# Patient Record
Sex: Female | Born: 1970
Health system: Southern US, Community
[De-identification: ages and names within clinical notes are randomized; demographics above are authoritative.]

## PROBLEM LIST (undated history)

## (undated) DIAGNOSIS — R3915 Urgency of urination: Secondary | ICD-10-CM

## (undated) DIAGNOSIS — K219 Gastro-esophageal reflux disease without esophagitis: Secondary | ICD-10-CM

## (undated) DIAGNOSIS — D649 Anemia, unspecified: Secondary | ICD-10-CM

## (undated) DIAGNOSIS — I10 Essential (primary) hypertension: Secondary | ICD-10-CM

## (undated) DIAGNOSIS — R519 Headache, unspecified: Secondary | ICD-10-CM

## (undated) DIAGNOSIS — Z87442 Personal history of urinary calculi: Secondary | ICD-10-CM

## (undated) DIAGNOSIS — J45909 Unspecified asthma, uncomplicated: Secondary | ICD-10-CM

## (undated) DIAGNOSIS — Z8489 Family history of other specified conditions: Secondary | ICD-10-CM

## (undated) DIAGNOSIS — N201 Calculus of ureter: Secondary | ICD-10-CM

## (undated) DIAGNOSIS — R51 Headache: Secondary | ICD-10-CM

## (undated) HISTORY — DX: Unspecified asthma, uncomplicated: J45.909

---

## 1994-09-25 HISTORY — PX: NEPHROLITHOTOMY: SUR881

## 2000-04-06 ENCOUNTER — Other Ambulatory Visit: Admission: RE | Admit: 2000-04-06 | Discharge: 2000-04-06 | Payer: Self-pay | Admitting: Obstetrics and Gynecology

## 2001-02-07 ENCOUNTER — Ambulatory Visit (HOSPITAL_COMMUNITY): Admission: RE | Admit: 2001-02-07 | Discharge: 2001-02-07 | Payer: Self-pay | Admitting: Obstetrics and Gynecology

## 2001-02-07 ENCOUNTER — Encounter: Payer: Self-pay | Admitting: Obstetrics and Gynecology

## 2001-04-17 ENCOUNTER — Encounter (HOSPITAL_COMMUNITY): Admission: AD | Admit: 2001-04-17 | Discharge: 2001-04-24 | Payer: Self-pay | Admitting: Obstetrics and Gynecology

## 2001-04-23 ENCOUNTER — Encounter: Payer: Self-pay | Admitting: Obstetrics and Gynecology

## 2001-04-29 ENCOUNTER — Encounter (HOSPITAL_COMMUNITY): Admission: AD | Admit: 2001-04-29 | Discharge: 2001-05-29 | Payer: Self-pay | Admitting: Obstetrics and Gynecology

## 2001-05-30 ENCOUNTER — Inpatient Hospital Stay (HOSPITAL_COMMUNITY): Admission: AD | Admit: 2001-05-30 | Discharge: 2001-05-30 | Payer: Self-pay | Admitting: Obstetrics and Gynecology

## 2001-06-06 ENCOUNTER — Encounter (HOSPITAL_COMMUNITY): Admission: RE | Admit: 2001-06-06 | Discharge: 2001-06-28 | Payer: Self-pay | Admitting: Obstetrics and Gynecology

## 2001-06-08 ENCOUNTER — Inpatient Hospital Stay (HOSPITAL_COMMUNITY): Admission: AD | Admit: 2001-06-08 | Discharge: 2001-06-08 | Payer: Self-pay | Admitting: Obstetrics and Gynecology

## 2001-07-02 ENCOUNTER — Encounter (INDEPENDENT_AMBULATORY_CARE_PROVIDER_SITE_OTHER): Payer: Self-pay | Admitting: Specialist

## 2001-07-02 ENCOUNTER — Inpatient Hospital Stay (HOSPITAL_COMMUNITY): Admission: AD | Admit: 2001-07-02 | Discharge: 2001-07-06 | Payer: Self-pay | Admitting: Obstetrics and Gynecology

## 2001-08-13 ENCOUNTER — Other Ambulatory Visit: Admission: RE | Admit: 2001-08-13 | Discharge: 2001-08-13 | Payer: Self-pay | Admitting: Obstetrics and Gynecology

## 2002-09-25 HISTORY — PX: ABDOMINOPLASTY/PANNICULECTOMY: SHX5578

## 2003-01-19 ENCOUNTER — Other Ambulatory Visit: Admission: RE | Admit: 2003-01-19 | Discharge: 2003-01-19 | Payer: Self-pay | Admitting: Obstetrics and Gynecology

## 2003-09-26 HISTORY — PX: BREAST REDUCTION SURGERY: SHX8

## 2004-01-20 ENCOUNTER — Other Ambulatory Visit: Admission: RE | Admit: 2004-01-20 | Discharge: 2004-01-20 | Payer: Self-pay | Admitting: Obstetrics and Gynecology

## 2004-11-21 ENCOUNTER — Ambulatory Visit: Payer: Self-pay | Admitting: Internal Medicine

## 2005-04-28 ENCOUNTER — Other Ambulatory Visit: Admission: RE | Admit: 2005-04-28 | Discharge: 2005-04-28 | Payer: Self-pay | Admitting: Obstetrics and Gynecology

## 2005-12-05 ENCOUNTER — Ambulatory Visit: Payer: Self-pay | Admitting: Internal Medicine

## 2006-03-17 ENCOUNTER — Emergency Department (HOSPITAL_COMMUNITY): Admission: EM | Admit: 2006-03-17 | Discharge: 2006-03-17 | Payer: Self-pay | Admitting: Emergency Medicine

## 2006-11-18 ENCOUNTER — Emergency Department (HOSPITAL_COMMUNITY): Admission: EM | Admit: 2006-11-18 | Discharge: 2006-11-18 | Payer: Self-pay | Admitting: Emergency Medicine

## 2007-10-06 ENCOUNTER — Emergency Department (HOSPITAL_COMMUNITY): Admission: EM | Admit: 2007-10-06 | Discharge: 2007-10-06 | Payer: Self-pay | Admitting: Family Medicine

## 2011-02-10 NOTE — H&P (Signed)
[facility]  Patient:    IPEK, WESTRA Visit Number: [id number] MRN: [id number]          Service Type: OBS Location: 910A [id number] [id number] Attending Physician:  Oliver Pila Dictated by:   Alvino Chapel, M.D. Admit Date:  07/02/2001                           History and Physical  HISTORY OF PRESENT ILLNESS:   The patient is a 40 year old G4, P0-1-2-1 who is scheduled for a repeat C-section at 39 weeks due to an unfavorable cervix and pregnancy-induced hypertension which necessitated delivery.  The patient decided to decline VBAC and proceed with a repeat C-section.  The patients due date is July 09, 2001 by first trimester ultrasound.  On admission, the patient had no preeclamptic symptoms.  However, she had continued to have persistently elevated blood pressures since approximately 35 weeks, running in the 150s/90-100.  She had no proteinuria.  Her PIH labs were normal prior to admission.  Therefore, the diagnosis was pregnancy-induced hypertension without evidence of preeclampsia.  Her pregnancy has been complicated by a history of a previous C-section for a preterm baby with pregnancy-induced hypertension with that pregnancy, as well.  Also, the patient was a group B strep carrier.  She has a history of HSV and a history of migraines.  PRENATAL LABORATORY DATA:     A positive.  Antibody negative.  RPR nonreactive.  Rubella immune.  Hepatitis B surface antigen.  HIV negative.  GC negative.  Chlamydia negative.  GBS positive.  PAST OBSTETRIC HISTORY:       1. In 1991 and 1992, the patient had elective                                  abortions.                               2. In 1997, she had a C-section of a preterm                                  infant at 35+ weeks.  The infant was in the                                  transverse lie and weighed 3 lb 15 oz.  PAST GYNECOLOGIC HISTORY:     History of HSV with no outbreaks and a  long time.  PAST MEDICAL HISTORY:         The patient does have a history of pregnancy-induced hypertension with her last delivery.  However, blood pressures were well controlled prior to pregnancy on Norvasc and Zyrtec.  The patient had discontinued these medications prior to pregnancy and, as blood pressure remained stable early on, was not restarted.  PAST SURGICAL HISTORY:        1. In 1996, she had a kidney stone removed.                               2. In 1997, she had her C-section.  PHYSICAL EXAMINATION:  VITAL SIGNS:  On admission, the patient is 233 lb.  Blood pressure 150/98.  CARDIAC:                      Regular rate and rhythm.  LUNGS:                        Clear to auscultation bilaterally.  ABDOMEN:                      Gravid and nontender with a fundal height of 37.  PELVIC:                       The cervix is long and closed.  PLAN:                         The patient was counseled as to the risks and benefits of repeat C-section including bleeding, infection and possible damage to bowel and bladder.  She was also given the option of induction and VBAC. However, she declined these, given an unfavorable cervix and the possibility of a long induction.  The patient also stated a desire for tubal ligation. She was quoted a risk of failure of approximately 1/200 and also the risk of increased ectopic should she become pregnant status post tubal.  She agrees to proceed with this permanent sterilization procedure at the time of her C-section. Dictated by:   Alvino Chapel, M.D. Attending Physician:  Oliver Pila DD:  07/02/01 TD:  07/02/01 Job: [id number]

## 2011-02-10 NOTE — Op Note (Signed)
[facility]  Patient:    Ashley Bradford, Ashley Bradford Visit Number: [id number] MRN: [id number]          Service Type: ANT Location: [facility] Attending Physician:  Michaele Offer Dictated by:   Alvino Chapel, M.D. Proc. Date: 07/02/01 Admit Date:  06/06/2001 Discharge Date: 06/28/2001                             Operative Report  PREOPERATIVE DIAGNOSES:       1. Term pregnancy at 39 weeks.                               2. Previous cesarean section, desires repeat.                               3. Pregnancy-induced hypertension.                               4. Breech presentation.  POSTOPERATIVE DIAGNOSES:      1. Term pregnancy at 39 weeks.                               2. Previous cesarean section, desires repeat.                               3. Pregnancy-induced hypertension.                               4. Breech presentation.  PROCEDURE:                    Repeat low transverse cesarean section with bilateral partial salpingectomy.  SURGEON:                      Alvino Chapel, M.D.  ASSISTANT:                    Zenaida Niece, M.D.  ANESTHESIA:                   Epidural spinal combination.  FINDINGS:                     Vigorous female infant in the breech presentation.  Apgars were 8 and 9.  Weight was 6 lb 3 oz.  The placenta, tubes and ovaries all appeared within normal limits.  ESTIMATED BLOOD LOSS:         800 cc.  URINE OUTPUT:                 150 cc clear urine.  IV FLUIDS:                    2000 cc LR.  DESCRIPTION OF PROCEDURE:     The patient was taken to the operating room, where a combination spinal and epidural was obtained without difficulty.  She was then prepped in the normal sterile fashion in the dorsal supine position with a leftward tilt.  A Pfannenstiel skin incision was then made through a preexisting scar and carried through to the  underlying layer of fascia by dissection with Bovie cautery.   The fascia was then nicked in the midline and the incision was extended laterally with Mayo scissors.  The inferior aspect was grasped with Kocher clamps, elevated and dissected off the underlying rectus muscle.  The superior aspect was dissected off the underlying rectus muscles after being elevated with Kocher clamps in a similar fashion.  The rectus muscles were then separated in the midline with hemostats and the peritoneal cavity entered bluntly.  The peritoneum was then extended both superiorly and inferiorly with careful attention to avoid both bowel and bladder.  A bladder blade was then inserted and the vesicouterine peritoneum identified.  There was some scarring of the bladder to the anterior surface of the uterus.  This was easily taken down with Metzenbaums and a bladder flap created additionally with Metzenbaum scissors.  The bladder was then pushed away from the lower uterine segment.  The bladder blade was reinserted and the lower uterine segment was incised in a transverse fashion.  The cavity was entered bluntly and the incision was extended laterally with bandage scissors. The infants sacrum was then identified and the bottom delivered easily with gentle fundal pressure.  Each leg was carefully reduced and the infant rotated with the arms reduced easily.  The head was delivered in a flexed position and the nose and mouth bulb suctioned.  The cord was clamped and cut. The infant was then handed off to the waiting pediatricians.  The placenta was then expressed manually and the uterus cleared of all clots and debris with a moist lap sponge.  The uterine incision was then closed with 0 Vicryl in a running locked fashion.  One additional area of bleeding in the right lower uterine segment was controlled with one figure-of-eight suture of the same.  Attention was then turned to the patients ovaries and tubes.  On her left, the fallopian tube was identified, grasped with a  Babcock clamp, elevated and a small window made in the mesosalpinx with Bovie cautery.  A 0 plain was then passed through the window and a 2-3 cm segment of tube tied off.  An additional plain tied was passed around this 2 cm knuckle and it was then amputated with Metzenbaum scissors.  The pedicle was then cauterized at the tubal lumen with Bovie cautery and then returned to the abdomen.  In a similar fashion, the patients right fallopian tube was grasped with a Babcock clamp, elevated and a window made in the mesosalpinx.  It was then also secured with two free ties of 0 plain, amputated and handed off to pathology. Additionally, the pedicle lumen were cauterized with Bovie cautery.  All was hemostatic and it was returned to the abdomen.  The uterine incision was then once again inspected and found to be hemostatic.  The bladder flap also was hemostatic.  The patients rectus muscles were then reapproximated with one figure-of-eight suture.  The fascia was then closed with 0 Vicryl in a running fashion.  The skin was closed with staples.  Sponge, lap, and needle counts were correct x 2.  The patient was taken to the recovery room in stable condition. Dictated by:   Alvino Chapel, M.D. Attending Physician:  Michaele Offer DD:  07/02/01 TD:  07/03/01 Job: [id number]

## 2011-02-10 NOTE — Discharge Summary (Signed)
[facility]  Patient:    Ashley Bradford, Ashley Bradford Visit Number: [id number] MRN: [id number]          Service Type: ANT Location: [facility] Attending Physician:  Michaele Offer Dictated by:   Malachi Pro. Ambrose Mantle, M.D. Admit Date:  06/06/2001 Discharge Date: 06/28/2001                             Discharge Summary  HISTORY:                      This is a 40 year old white female, para 0-1-2-1, gravida 4, blood group and type A positive, negative antibody, nonreactive serology, rubella immune, hepatitis B surface antigen negative, HIV negative, GC and Chlamydia negative, triple screen declined, admitted for repeat C-section after previous C-section, desired a repeat, pregnancy induced high blood pressure and breech presentation.  HOSPITAL COURSE:              The patient underwent a low transverse cervical cesarean section with bilateral tubal ligation by Dr. Senaida Ores, with Dr. Jackelyn Knife assisting, under epidural and spinal anesthesia. Vigorous female infant, 6 pounds 3 ounces, with Apgars of 8 and 9 at one and five minutes, breech presentation. Placenta, tubes, and ovaries were normal. Blood loss was about 800 cc.  Postpartum, the patient did quite well. She wanted to stay until the fourth postoperative day and on the fourth postoperative day, was ambulating well without difficulty, voiding well without difficulty, having bowel movements. Staples were removed and Steri-Strips were applied and she was ready for discharge.  Pathology report showed fallopian tube segments bilateral, complete transections with no pathologic abnormalities.  LABORATORY DATA:              Hemoglobin was 11.6, hematocrit 33.5, white count 13,500, platelet count 269,000. Followup hemoglobin 9.0, hematocrit 26.0, white count 11,100, platelet count 186,000. RPR nonreactive.  FINAL DIAGNOSES:              1. Intrauterine pregnancy at 39+ weeks.                               2.  Pregnancy induced high blood pressure.                               3. Breech presentation.                               4. Previous cesarean section, declined vaginal                                  birth after cesarean.  OPERATION:                    1. Repeat low transverse cervical cesarean                                  section.                               2. Bilateral tubal ligation.  FINAL CONDITION:  Improved.  DISCHARGE INSTRUCTIONS:       Instructions include our regular discharge instruction booklets.  DISCHARGE MEDICATIONS:        The patient is given a prescription for Tylenol #3, 16 tablets, one every four to six hours as needed for pain.  DISCHARGE FOLLOWUP:           The patient is advised to return to the office in 10 to 14 days for followup examination.                                  hours as needed for pain.Dictated by:   Malachi Pro. Ambrose Mantle, M.D. Attending Physician:  Michaele Offer DD:  07/06/01 TD:  07/06/01 Job: [id number]

## 2011-05-26 ENCOUNTER — Emergency Department (HOSPITAL_COMMUNITY)
Admission: EM | Admit: 2011-05-26 | Discharge: 2011-05-26 | Disposition: A | Discharge status: Home / Self Care | Payer: No Typology Code available for payment source | Attending: Emergency Medicine | Admitting: Emergency Medicine

## 2011-05-26 ENCOUNTER — Encounter: Payer: Self-pay | Admitting: Emergency Medicine

## 2011-05-26 ENCOUNTER — Emergency Department (HOSPITAL_COMMUNITY): Payer: No Typology Code available for payment source

## 2011-05-26 DIAGNOSIS — S161XXA Strain of muscle, fascia and tendon at neck level, initial encounter: Secondary | ICD-10-CM

## 2011-05-26 DIAGNOSIS — S139XXA Sprain of joints and ligaments of unspecified parts of neck, initial encounter: Secondary | ICD-10-CM | POA: Insufficient documentation

## 2011-05-26 DIAGNOSIS — Y92410 Unspecified street and highway as the place of occurrence of the external cause: Secondary | ICD-10-CM | POA: Insufficient documentation

## 2011-05-26 DIAGNOSIS — I10 Essential (primary) hypertension: Secondary | ICD-10-CM | POA: Insufficient documentation

## 2011-05-26 HISTORY — DX: Essential (primary) hypertension: I10

## 2011-05-26 MED ORDER — IBUPROFEN 800 MG PO TABS: 800.0000 mg | ORAL_TABLET | Freq: Three times a day (TID) | ORAL | Status: AC

## 2011-05-26 MED ORDER — HYDROCODONE-ACETAMINOPHEN 5-325 MG PO TABS
2.0000 | ORAL_TABLET | Freq: Once | ORAL | Status: AC
  Administered 2011-05-26: 2 via ORAL
  Filled 2011-05-26: qty 2

## 2011-05-26 MED ORDER — HYDROCODONE-ACETAMINOPHEN 5-325 MG PO TABS: 2.0000 | ORAL_TABLET | ORAL | Status: AC | PRN

## 2011-05-26 NOTE — ED Notes (Signed)
Pt resting in bed, shp officer at bedside.--c-collar remains in place
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
## 2011-05-30 ENCOUNTER — Encounter (HOSPITAL_COMMUNITY): Payer: Self-pay

## 2012-12-27 ENCOUNTER — Other Ambulatory Visit: Payer: Self-pay | Admitting: *Deleted

## 2012-12-27 MED ORDER — ZOLPIDEM TARTRATE 10 MG PO TABS: 10.0000 mg | ORAL_TABLET | Freq: Every evening | ORAL | Status: DC | PRN

## 2012-12-27 NOTE — Telephone Encounter (Signed)
Please route to your nurse to call in. Thanks. Last seen 4/13

## 2012-12-27 NOTE — Telephone Encounter (Signed)
CALLED INTO [employer]

## 2012-12-27 NOTE — Telephone Encounter (Signed)
Please call in

## 2013-01-08 ENCOUNTER — Telehealth: Payer: Self-pay | Admitting: Nurse Practitioner

## 2013-01-08 NOTE — Telephone Encounter (Signed)
Please make appointment for patient

## 2013-01-08 NOTE — Telephone Encounter (Signed)
APPT MADE

## 2013-01-28 ENCOUNTER — Other Ambulatory Visit: Payer: Self-pay | Admitting: Nurse Practitioner

## 2013-02-26 ENCOUNTER — Ambulatory Visit (INDEPENDENT_AMBULATORY_CARE_PROVIDER_SITE_OTHER): Payer: Managed Care, Other (non HMO) | Admitting: Nurse Practitioner

## 2013-02-26 ENCOUNTER — Encounter: Payer: Self-pay | Admitting: Nurse Practitioner

## 2013-02-26 VITALS — BP 126/78 | HR 70 | Temp 98.2°F | Ht 64.0 in | Wt 239.0 lb

## 2013-02-26 DIAGNOSIS — M549 Dorsalgia, unspecified: Secondary | ICD-10-CM

## 2013-02-26 DIAGNOSIS — Z91010 Allergy to peanuts: Secondary | ICD-10-CM

## 2013-02-26 DIAGNOSIS — G43909 Migraine, unspecified, not intractable, without status migrainosus: Secondary | ICD-10-CM

## 2013-02-26 DIAGNOSIS — G4700 Insomnia, unspecified: Secondary | ICD-10-CM

## 2013-02-26 DIAGNOSIS — E669 Obesity, unspecified: Secondary | ICD-10-CM

## 2013-02-26 DIAGNOSIS — K219 Gastro-esophageal reflux disease without esophagitis: Secondary | ICD-10-CM | POA: Insufficient documentation

## 2013-02-26 DIAGNOSIS — I10 Essential (primary) hypertension: Secondary | ICD-10-CM | POA: Insufficient documentation

## 2013-02-26 DIAGNOSIS — Z0000 Encounter for general adult medical examination without abnormal findings: Secondary | ICD-10-CM

## 2013-02-26 LAB — COMPLETE METABOLIC PANEL WITH GFR
ALT: 15 U/L (ref 0–35)
AST: 12 U/L (ref 0–37)
Albumin: 4.2 g/dL (ref 3.5–5.2)
Alkaline Phosphatase: 76 U/L (ref 39–117)
Calcium: 9.1 mg/dL (ref 8.4–10.5)
Creat: 0.95 mg/dL (ref 0.50–1.10)
GFR, Est African American: 86 mL/min
GFR, Est Non African American: 75 mL/min
Glucose, Bld: 88 mg/dL (ref 70–99)
Total Bilirubin: 0.3 mg/dL (ref 0.3–1.2)
Total Protein: 6.5 g/dL (ref 6.0–8.3)

## 2013-02-26 MED ORDER — PANTOPRAZOLE SODIUM 40 MG PO TBEC: 40.0000 mg | DELAYED_RELEASE_TABLET | Freq: Every day | ORAL | Status: DC

## 2013-02-26 MED ORDER — ZOLPIDEM TARTRATE 10 MG PO TABS: 10.0000 mg | ORAL_TABLET | Freq: Every evening | ORAL | Status: DC | PRN

## 2013-02-26 MED ORDER — NEBIVOLOL HCL 10 MG PO TABS: 10.0000 mg | ORAL_TABLET | Freq: Every day | ORAL | Status: DC

## 2013-02-26 MED ORDER — NAPROXEN 500 MG PO TABS: 500.0000 mg | ORAL_TABLET | Freq: Two times a day (BID) | ORAL | Status: DC

## 2013-02-26 MED ORDER — EPINEPHRINE 0.3 MG/0.3ML IJ SOAJ: 0.3000 mg | Freq: Once | INTRAMUSCULAR | Status: DC

## 2013-02-26 MED ORDER — IBUPROFEN 800 MG PO TABS: 800.0000 mg | ORAL_TABLET | Freq: Three times a day (TID) | ORAL | Status: DC | PRN

## 2013-02-26 NOTE — Progress Notes (Signed)
Subjective:    Patient ID: Ashley Bradford, female    DOB: 03-Jul-1971, 42 y.o.   MRN: [id number]  Patient here today for cpe for work- She is doing well- Has no compliants today  Hypertension This is a chronic problem. The current episode started more than 1 year ago. The problem has been resolved since onset. The problem is controlled. Pertinent negatives include no blurred vision, chest pain, headaches, palpitations, peripheral edema or shortness of breath. Risk factors for coronary artery disease include obesity. Past treatments include beta blockers. The current treatment provides significant improvement. Compliance problems include diet and exercise.   GERD Taking pepcid which doesn't help- like protonix but is to expensive- would like to try protonix again     Review of Systems  Eyes: Negative for blurred vision.  Respiratory: Negative for shortness of breath.   Cardiovascular: Negative for chest pain and palpitations.  Neurological: Negative for headaches.  All other systems reviewed and are negative.       Objective:   Physical Exam  Constitutional: She is oriented to person, place, and time. She appears well-developed and well-nourished.  HENT:  Nose: Nose normal.  Mouth/Throat: Oropharynx is clear and moist.  Eyes: EOM are normal.  Neck: Trachea normal, normal range of motion and full passive range of motion without pain. Neck supple. No JVD present. Carotid bruit is not present. No thyromegaly present.  Cardiovascular: Normal rate, regular rhythm, normal heart sounds and intact distal pulses.  Exam reveals no gallop and no friction rub.   No murmur heard. Pulmonary/Chest: Effort normal and breath sounds normal.  Abdominal: Soft. Bowel sounds are normal. She exhibits no distension and no mass. There is no tenderness.  Musculoskeletal: Normal range of motion.  Lymphadenopathy:    She has no cervical adenopathy.  Neurological: She is alert and oriented to person, place,  and time. She has normal reflexes.  Skin: Skin is warm and dry.  Psychiatric: She has a normal mood and affect. Her behavior is normal. Judgment and thought content normal.    BP 126/78  Pulse 70  Temp(Src) 98.2 F (36.8 C) (Oral)  Ht 5\' 4"  (1.626 m)  Wt 239 lb (108.41 kg)  BMI 41 kg/m2       Assessment & Plan:   1. Annual physical exam   2. Hypertension   3. GERD (gastroesophageal reflux disease)   4. Obesity (BMI 35.0-39.9 without comorbidity)   5. Migraines   6. Back pain   7. Insomnia   8. Peanut allergy    Orders Placed This Encounter  Procedures  . COMPLETE METABOLIC PANEL WITH GFR  . NMR Lipoprofile with Lipids   Meds ordered this encounter  Medications  . clobetasol cream (TEMOVATE) 0.05 %    Sig:   . terbinafine (LAMISIL) 250 MG tablet    Sig:   . valACYclovir (VALTREX) 500 MG tablet    Sig:   . nebivolol (BYSTOLIC) 10 MG tablet    Sig: Take 1 tablet (10 mg total) by mouth daily.    Dispense:  90 tablet    Refill:  1    Order Specific Question:  Supervising Provider    Answer:  Ernestina Penna [[id number]]  . naproxen (NAPROSYN) 500 MG tablet    Sig: Take 1 tablet (500 mg total) by mouth 2 (two) times daily with a meal.    Dispense:  180 tablet    Refill:  0    Order Specific Question:  Supervising Provider  Answer:  Ernestina Penna [[id number]]  . ibuprofen (ADVIL,MOTRIN) 800 MG tablet    Sig: Take 1 tablet (800 mg total) by mouth every 8 (eight) hours as needed for pain.    Dispense:  180 tablet    Refill:  0    Order Specific Question:  Supervising Provider    Answer:  Ernestina Penna [[id number]]  . pantoprazole (PROTONIX) 40 MG tablet    Sig: Take 1 tablet (40 mg total) by mouth daily.    Dispense:  90 tablet    Refill:  1    Order Specific Question:  Supervising Provider    Answer:  Ernestina Penna [[id number]]  . zolpidem (AMBIEN) 10 MG tablet    Sig: Take 1 tablet (10 mg total) by mouth at bedtime as needed for sleep.    Dispense:  30 tablet    Refill:  1     Order Specific Question:  Supervising Provider    Answer:  Ernestina Penna [[id number]]  . EPINEPHrine (EPIPEN) 0.3 mg/0.3 mL DEVI    Sig: Inject 0.3 mLs (0.3 mg total) into the muscle once.    Dispense:  2 Device    Refill:  1    Order Specific Question:  Supervising Provider    Answer:  Ernestina Penna [[id number]]   Diet and exercise encouraged Labs pending Follow up in 6 months  Mary-Margaret Daphine Deutscher, FNP

## 2013-02-26 NOTE — Patient Instructions (Signed)

## 2013-02-27 LAB — NMR LIPOPROFILE WITH LIPIDS
HDL Size: 9.2 nm (ref >=9.2)
HDL-C: 50 mg/dL (ref >=40)
LDL Size: 20.8 nm (ref >20.5)
Large HDL-P: 5.1 umol/L (ref >=4.8)
Triglycerides: 95 mg/dL (ref <150)

## 2013-03-12 ENCOUNTER — Telehealth: Payer: Self-pay | Admitting: Nurse Practitioner

## 2013-03-13 ENCOUNTER — Encounter: Payer: Self-pay | Admitting: *Deleted

## 2013-03-13 NOTE — Telephone Encounter (Signed)
Pt aware.

## 2013-08-05 ENCOUNTER — Telehealth: Payer: Self-pay | Admitting: Nurse Practitioner

## 2013-08-05 NOTE — Telephone Encounter (Signed)
Has patient not been taking meds sinc June that is when they were written

## 2013-08-05 NOTE — Telephone Encounter (Signed)
90 day supply was given on all meds and should last hr till December- does she wants [place] to refill now- NTBS

## 2013-08-05 NOTE — Telephone Encounter (Signed)
She never go them when she was here

## 2013-08-11 ENCOUNTER — Other Ambulatory Visit: Payer: Self-pay | Admitting: *Deleted

## 2013-08-11 DIAGNOSIS — I10 Essential (primary) hypertension: Secondary | ICD-10-CM

## 2013-08-11 MED ORDER — NEBIVOLOL HCL 10 MG PO TABS: 10.0000 mg | ORAL_TABLET | Freq: Every day | ORAL | Status: DC

## 2013-08-11 NOTE — Telephone Encounter (Signed)
LAST OV 02/26/13. MAIL ORDER, PLEASE PRINT FOR PT TO PICKUP. THANKS.

## 2013-08-19 ENCOUNTER — Encounter: Payer: Self-pay | Admitting: Family Medicine

## 2013-08-19 ENCOUNTER — Ambulatory Visit (INDEPENDENT_AMBULATORY_CARE_PROVIDER_SITE_OTHER): Payer: Managed Care, Other (non HMO) | Admitting: Family Medicine

## 2013-08-19 VITALS — BP 130/76 | HR 69 | Temp 97.0°F | Ht 64.0 in | Wt 247.0 lb

## 2013-08-19 DIAGNOSIS — H60399 Other infective otitis externa, unspecified ear: Secondary | ICD-10-CM

## 2013-08-19 DIAGNOSIS — B370 Candidal stomatitis: Secondary | ICD-10-CM

## 2013-08-19 DIAGNOSIS — I10 Essential (primary) hypertension: Secondary | ICD-10-CM

## 2013-08-19 DIAGNOSIS — H6093 Unspecified otitis externa, bilateral: Secondary | ICD-10-CM

## 2013-08-19 DIAGNOSIS — J40 Bronchitis, not specified as acute or chronic: Secondary | ICD-10-CM

## 2013-08-19 MED ORDER — METHYLPREDNISOLONE ACETATE 80 MG/ML IJ SUSP
80.0000 mg | Freq: Once | INTRAMUSCULAR | Status: AC
  Administered 2013-08-19: 80 mg via INTRAMUSCULAR

## 2013-08-19 MED ORDER — AZITHROMYCIN 250 MG PO TABS: ORAL_TABLET | ORAL | Status: DC

## 2013-08-19 MED ORDER — NEBIVOLOL HCL 10 MG PO TABS: 10.0000 mg | ORAL_TABLET | Freq: Every day | ORAL | Status: DC

## 2013-08-19 MED ORDER — NYSTATIN 100000 UNIT/ML MT SUSP: 5.0000 mL | Freq: Four times a day (QID) | OROMUCOSAL | Status: DC

## 2013-08-19 MED ORDER — NEOMYCIN-POLYMYXIN-HC 3.5-10000-1 OT SOLN: 3.0000 [drp] | Freq: Four times a day (QID) | OTIC | Status: DC

## 2013-08-19 NOTE — Progress Notes (Signed)
  Subjective:    Patient ID: Ashley Bradford, female    DOB: 18-Nov-1970, 42 y.o.   MRN: [id number]  HPI URI Symptoms Onset: 1 week  Description: rhinorrhea, nasal congestion, cough, faint wheezing  Modifying factors:  Baseline asthma   Symptoms Nasal discharge: yes Fever: no Sore throat: no Cough: yes Wheezing: yes Ear pain: no GI symptoms: no Sick contacts: multiple  Red Flags  Stiff neck: no Dyspnea: no Rash: no Swallowing difficulty: no  Sinusitis Risk Factors Headache/face pain: no Double sickening: no tooth pain: n  Allergy Risk Factors Sneezing: no Itchy scratchy throat: no Seasonal symptoms: no  Flu Risk Factors Headache: no muscle aches: no severe fatigue: no     Review of Systems  All other systems reviewed and are negative.       Objective:   Physical Exam  Constitutional: She is oriented to person, place, and time. She appears well-developed and well-nourished.  HENT:  Head: Normocephalic and atraumatic.  Right Ear: External ear normal.  Left Ear: External ear normal.  +nasal erythema, rhinorrhea bilaterally, + post oropharyngeal erythema  Bilateral ear canal erythema and tenderness to otoscopic evaluation.  + oral thrush     Eyes: Conjunctivae are normal. Pupils are equal, round, and reactive to light.  Neck: Normal range of motion. Neck supple.  Cardiovascular: Normal rate and regular rhythm.   Pulmonary/Chest: Effort normal and breath sounds normal.  Abdominal: Soft. Bowel sounds are normal.  Musculoskeletal: Normal range of motion.  Neurological: She is alert and oriented to person, place, and time.  Skin: Skin is warm.          Assessment & Plan:  Hypertension - Plan: nebivolol (BYSTOLIC) 10 MG tablet, DISCONTINUED: nebivolol (BYSTOLIC) 10 MG tablet  Bronchitis - Plan: azithromycin (ZITHROMAX) 250 MG tablet, methylPREDNISolone acetate (DEPO-MEDROL) injection 80 mg  Oral thrush - Plan: nystatin (MYCOSTATIN) 100000 UNIT/ML  suspension  OE (otitis externa), bilateral - Plan: neomycin-polymyxin-hydrocortisone (CORTISPORIN) otic solution  Plan as above.  zpak for atypical coverage if sxs fail to improve.  Nystatin swish and swallow for thrush Cortisporin for OE Discussed general, resp, ENT and infectious red flags  Follow up as needed.

## 2013-08-19 NOTE — Patient Instructions (Signed)

## 2013-11-25 ENCOUNTER — Encounter (HOSPITAL_BASED_OUTPATIENT_CLINIC_OR_DEPARTMENT_OTHER): Payer: Self-pay | Admitting: Emergency Medicine

## 2013-11-25 ENCOUNTER — Emergency Department (HOSPITAL_BASED_OUTPATIENT_CLINIC_OR_DEPARTMENT_OTHER)
Admission: EM | Admit: 2013-11-25 | Discharge: 2013-11-25 | Disposition: A | Discharge status: Home / Self Care | Payer: Managed Care, Other (non HMO) | Attending: Emergency Medicine | Admitting: Emergency Medicine

## 2013-11-25 ENCOUNTER — Emergency Department (HOSPITAL_BASED_OUTPATIENT_CLINIC_OR_DEPARTMENT_OTHER): Payer: Managed Care, Other (non HMO)

## 2013-11-25 DIAGNOSIS — Z79899 Other long term (current) drug therapy: Secondary | ICD-10-CM | POA: Insufficient documentation

## 2013-11-25 DIAGNOSIS — Z791 Long term (current) use of non-steroidal anti-inflammatories (NSAID): Secondary | ICD-10-CM | POA: Insufficient documentation

## 2013-11-25 DIAGNOSIS — Z3202 Encounter for pregnancy test, result negative: Secondary | ICD-10-CM | POA: Insufficient documentation

## 2013-11-25 DIAGNOSIS — N200 Calculus of kidney: Secondary | ICD-10-CM

## 2013-11-25 DIAGNOSIS — Z9889 Other specified postprocedural states: Secondary | ICD-10-CM | POA: Insufficient documentation

## 2013-11-25 DIAGNOSIS — J45909 Unspecified asthma, uncomplicated: Secondary | ICD-10-CM | POA: Insufficient documentation

## 2013-11-25 DIAGNOSIS — I10 Essential (primary) hypertension: Secondary | ICD-10-CM | POA: Insufficient documentation

## 2013-11-25 DIAGNOSIS — N390 Urinary tract infection, site not specified: Secondary | ICD-10-CM

## 2013-11-25 LAB — URINALYSIS, ROUTINE W REFLEX MICROSCOPIC
BILIRUBIN URINE: NEGATIVE
GLUCOSE, UA: NEGATIVE mg/dL
Ketones, ur: NEGATIVE mg/dL
Nitrite: NEGATIVE
PH: 5.5 (ref 5.0–8.0)
Protein, ur: NEGATIVE mg/dL
SPECIFIC GRAVITY, URINE: 1.019 (ref 1.005–1.030)
UROBILINOGEN UA: 0.2 mg/dL (ref 0.0–1.0)

## 2013-11-25 LAB — PREGNANCY, URINE: Preg Test, Ur: NEGATIVE

## 2013-11-25 LAB — URINE MICROSCOPIC-ADD ON

## 2013-11-25 MED ORDER — CEPHALEXIN 500 MG PO CAPS
500.0000 mg | ORAL_CAPSULE | Freq: Four times a day (QID) | ORAL | Status: DC
Start: 2013-11-25 — End: 2014-05-12

## 2013-11-25 MED ORDER — HYDROCODONE-ACETAMINOPHEN 5-325 MG PO TABS: 2.0000 | ORAL_TABLET | ORAL | Status: DC | PRN

## 2013-11-25 MED ORDER — DIPHENHYDRAMINE HCL 50 MG/ML IJ SOLN: 25.0000 mg | Freq: Once | INTRAMUSCULAR | Status: DC

## 2013-11-25 MED ORDER — DIPHENHYDRAMINE HCL 50 MG/ML IJ SOLN
25.0000 mg | Freq: Once | INTRAMUSCULAR | Status: AC
  Administered 2013-11-25: 25 mg via INTRAMUSCULAR
  Filled 2013-11-25: qty 1

## 2013-11-25 MED ORDER — HYDROCODONE-ACETAMINOPHEN 7.5-325 MG/15ML PO SOLN
20.0000 mL | Freq: Once | ORAL | Status: AC
  Administered 2013-11-25: 20 mL via ORAL
  Filled 2013-11-25: qty 30

## 2013-11-25 MED ORDER — KETOROLAC TROMETHAMINE 60 MG/2ML IM SOLN
60.0000 mg | Freq: Once | INTRAMUSCULAR | Status: AC
  Administered 2013-11-25: 60 mg via INTRAMUSCULAR
  Filled 2013-11-25: qty 2

## 2013-11-25 NOTE — ED Notes (Signed)
Pt complains of left flank pain that started about 3 hrs ago and complains of dysuria.  Complains flank pain radiates around to left groin.

## 2013-11-25 NOTE — ED Notes (Signed)
Pt reports ok with taking Toradol.  Reports now that it just didn't work last time instead of the rash initially.

## 2013-11-25 NOTE — ED Provider Notes (Signed)
CSN: [id number]     Arrival date & time 11/25/13  1836 History   First MD Initiated Contact with Patient 11/25/13 1914     Chief Complaint  Patient presents with  . Flank Pain    left     (Consider location/radiation/quality/duration/timing/severity/associated sxs/prior Treatment) Patient is a 43 y.o. female presenting with flank pain. The history is provided by the patient. No language interpreter was used.  Flank Pain This is a new problem. The current episode started today. The problem occurs constantly. The problem has been unchanged. Associated symptoms include abdominal pain. Nothing aggravates the symptoms. She has tried nothing for the symptoms. The treatment provided no relief.    Past Medical History  Diagnosis Date  . Hypertension   . Asthma   . Kidney calculus    Past Surgical History  Procedure Laterality Date  . Nephrolithotomy    . Cesarean section    . Tubal ligation     Family History  Problem Relation Age of Onset  . Hypertension Mother   . Hypertension Father    History  Substance Use Topics  . Smoking status: Never Smoker   . Smokeless tobacco: Not on file  . Alcohol Use: Yes     Comment: occasionally   OB History   Grav Para Term Preterm Abortions TAB SAB Ect Mult Living                 Review of Systems  Gastrointestinal: Positive for abdominal pain.  Genitourinary: Positive for flank pain.  All other systems reviewed and are negative.      Allergies  Hydromorphone; Morphine and related; Peanut-containing drug products; Toradol; and Sulfa antibiotics  Home Medications   Current Outpatient Rx  Name  Route  Sig  Dispense  Refill  . azithromycin (ZITHROMAX) 250 MG tablet      Take 2 tabs PO x 1 dose, then 1 tab PO QD x 4 days   6 tablet   0   . Calcium Carbonate Antacid (TUMS E-X PO)   Oral   Take 1 tablet by mouth as needed.           . clobetasol cream (TEMOVATE) 0.05 %               . EPINEPHrine (EPIPEN) 0.3 mg/0.3  mL DEVI   Intramuscular   Inject 0.3 mLs (0.3 mg total) into the muscle once.   2 Device   1   . ibuprofen (ADVIL,MOTRIN) 800 MG tablet   Oral   Take 1 tablet (800 mg total) by mouth every 8 (eight) hours as needed for pain.   180 tablet   0   . naproxen (NAPROSYN) 500 MG tablet   Oral   Take 1 tablet (500 mg total) by mouth 2 (two) times daily with a meal.   180 tablet   0   . nebivolol (BYSTOLIC) 10 MG tablet   Oral   Take 1 tablet (10 mg total) by mouth daily.   90 tablet   1   . neomycin-polymyxin-hydrocortisone (CORTISPORIN) otic solution   Both Ears   Place 3 drops into both ears 4 (four) times daily.   10 mL   0   . nystatin (MYCOSTATIN) 100000 UNIT/ML suspension   Oral   Take 5 mLs (500,000 Units total) by mouth 4 (four) times daily.   60 mL   0   . pantoprazole (PROTONIX) 40 MG tablet   Oral   Take 1 tablet (40  mg total) by mouth daily.   90 tablet   1   . valACYclovir (VALTREX) 500 MG tablet               . EXPIRED: zolpidem (AMBIEN) 10 MG tablet   Oral   Take 1 tablet (10 mg total) by mouth at bedtime as needed for sleep.   30 tablet   1    BP 123/64  Pulse 88  Temp(Src) 98.5 F (36.9 C) (Oral)  Resp 20  Ht 5\' 4"  (1.626 m)  Wt 240 lb (108.863 kg)  BMI 41.18 kg/m2  SpO2 98%  LMP 11/23/2013 Physical Exam  Nursing note and vitals reviewed. Constitutional: She is oriented to person, place, and time. She appears well-developed.  HENT:  Head: Normocephalic and atraumatic.  Neck: Normal range of motion.  Cardiovascular: Normal rate.   Pulmonary/Chest: Effort normal.  Musculoskeletal: Normal range of motion.  Neurological: She is alert and oriented to person, place, and time. She has normal reflexes.  Skin: Skin is warm.  Psychiatric: She has a normal mood and affect.    ED Course  Procedures (including critical care time) Labs Review Labs Reviewed  URINALYSIS, ROUTINE W REFLEX MICROSCOPIC - Abnormal; Notable for the following:     APPearance CLOUDY (*)    Hgb urine dipstick LARGE (*)    Leukocytes, UA MODERATE (*)    All other components within normal limits  URINE MICROSCOPIC-ADD ON - Abnormal; Notable for the following:    Squamous Epithelial / LPF FEW (*)    Bacteria, UA MANY (*)    All other components within normal limits  PREGNANCY, URINE   Imaging Review Dg Abd 1 View  11/25/2013   CLINICAL DATA:  Left flank pain, history of nephrolithiasis  EXAM: ABDOMEN - 1 VIEW  COMPARISON:  03/17/2006  FINDINGS: Tiny punctate radiodense foci project over the right kidney lower pole and the left kidney midpole. This could represent nonobstructing intrarenal calculi. Small left hemipelvis calcification noted measures 3 mm. This is indeterminate for a left distal ureteral calculus. No osseous abnormality. Nonobstructive bowel gas pattern.  IMPRESSION: Question tiny punctate intrarenal calculi bilaterally.  3 mm left hemipelvis calcification remains indeterminate for a left distal ureteral calculus by plain radiography.   Electronically Signed   By: Ruel Favorsrevor  Shick M.D.   On: 11/25/2013 19:42     EKG Interpretation None      MDM Pt given torodol and hydrocodone with pain relief.   Pt has uti and possible small 3mm stone at left distal.   I will treat with keflex and hydrocodone.   Pt referred to urology for evaluation.      Final diagnoses:  UTI (lower urinary tract infection)  Kidney stone        Elson AreasLeslie K Sofia, PA-C 11/25/13 2134

## 2013-11-25 NOTE — ED Provider Notes (Signed)
Medical screening examination/treatment/procedure(s) were performed by non-physician practitioner and as supervising physician I was immediately available for consultation/collaboration.   EKG Interpretation None        Tea Collums, MD 11/25/13 2331 

## 2013-11-25 NOTE — Discharge Instructions (Signed)
Kidney Stones Kidney stones (urolithiasis) are deposits that form inside your kidneys. The intense pain is caused by the stone moving through the urinary tract. When the stone moves, the ureter goes into spasm around the stone. The stone is usually passed in the urine.  CAUSES   A disorder that makes certain neck glands produce too much parathyroid hormone (primary hyperparathyroidism).  A buildup of uric acid crystals, similar to gout in your joints.  Narrowing (stricture) of the ureter.  A kidney obstruction present at birth (congenital obstruction).  Previous surgery on the kidney or ureters.  Numerous kidney infections. SYMPTOMS   Feeling sick to your stomach (nauseous).  Throwing up (vomiting).  Blood in the urine (hematuria).  Pain that usually spreads (radiates) to the groin.  Frequency or urgency of urination. DIAGNOSIS   Taking a history and physical exam.  Blood or urine tests.  CT scan.  Occasionally, an examination of the inside of the urinary bladder (cystoscopy) is performed. TREATMENT   Observation.  Increasing your fluid intake.  Extracorporeal shock wave lithotripsy This is a noninvasive procedure that uses shock waves to break up kidney stones.  Surgery may be needed if you have severe pain or persistent obstruction. There are various surgical procedures. Most of the procedures are performed with the use of small instruments. Only small incisions are needed to accommodate these instruments, so recovery time is minimized. The size, location, and chemical composition are all important variables that will determine the proper choice of action for you. Talk to your health care provider to better understand your situation so that you will minimize the risk of injury to yourself and your kidney.  HOME CARE INSTRUCTIONS   Drink enough water and fluids to keep your urine clear or pale yellow. This will help you to pass the stone or stone fragments.  Strain  all urine through the provided strainer. Keep all particulate matter and stones for your health care provider to see. The stone causing the pain may be as small as a grain of salt. It is very important to use the strainer each and every time you pass your urine. The collection of your stone will allow your health care provider to analyze it and verify that a stone has actually passed. The stone analysis will often identify what you can do to reduce the incidence of recurrences.  Only take over-the-counter or prescription medicines for pain, discomfort, or fever as directed by your health care provider.  Make a follow-up appointment with your health care provider as directed.  Get follow-up X-rays if required. The absence of pain does not always mean that the stone has passed. It may have only stopped moving. If the urine remains completely obstructed, it can cause loss of kidney function or even complete destruction of the kidney. It is your responsibility to make sure X-rays and follow-ups are completed. Ultrasounds of the kidney can show blockages and the status of the kidney. Ultrasounds are not associated with any radiation and can be performed easily in a matter of minutes. SEEK MEDICAL CARE IF:  You experience pain that is progressive and unresponsive to any pain medicine you have been prescribed. SEEK IMMEDIATE MEDICAL CARE IF:   Pain cannot be controlled with the prescribed medicine.  You have a fever or shaking chills.  The severity or intensity of pain increases over 18 hours and is not relieved by pain medicine.  You develop a new onset of abdominal pain.  You feel faint or pass   out.  You are unable to urinate. MAKE SURE YOU:   Understand these instructions.  Will watch your condition.  Will get help right away if you are not doing well or get worse. Document Released: 09/11/2005 Document Revised: 05/14/2013 Document Reviewed: 02/12/2013 Exit[employer] Patient Information 2014  [employer] Urinary Tract Infection Urinary tract infections (UTIs) can develop anywhere along your urinary tract. Your urinary tract is your body's drainage system for removing wastes and extra water. Your urinary tract includes two kidneys, two ureters, a bladder, and a urethra. Your kidneys are a pair of bean-shaped organs. Each kidney is about the size of your fist. They are located below your ribs, one on each side of your spine. CAUSES Infections are caused by microbes, which are microscopic organisms, including fungi, viruses, and bacteria. These organisms are so small that they can only be seen through a microscope. Bacteria are the microbes that most commonly cause UTIs. SYMPTOMS  Symptoms of UTIs may vary by age and gender of the patient and by the location of the infection. Symptoms in young women typically include a frequent and intense urge to urinate and a painful, burning feeling in the bladder or urethra during urination. Older women and men are more likely to be tired, shaky, and weak and have muscle aches and abdominal pain. A fever may mean the infection is in your kidneys. Other symptoms of a kidney infection include pain in your back or sides below the ribs, nausea, and vomiting. DIAGNOSIS To diagnose a UTI, your caregiver will ask you about your symptoms. Your caregiver also will ask to provide a urine sample. The urine sample will be tested for bacteria and white blood cells. White blood cells are made by your body to help fight infection. TREATMENT  Typically, UTIs can be treated with medication. Because most UTIs are caused by a bacterial infection, they usually can be treated with the use of antibiotics. The choice of antibiotic and length of treatment depend on your symptoms and the type of bacteria causing your infection. HOME CARE INSTRUCTIONS  If you were prescribed antibiotics, take them exactly as your caregiver instructs you. Finish the medication even if you feel  better after you have only taken some of the medication.  Drink enough water and fluids to keep your urine clear or pale yellow.  Avoid caffeine, tea, and carbonated beverages. They tend to irritate your bladder.  Empty your bladder often. Avoid holding urine for long periods of time.  Empty your bladder before and after sexual intercourse.  After a bowel movement, women should cleanse from front to back. Use each tissue only once. SEEK MEDICAL CARE IF:   You have back pain.  You develop a fever.  Your symptoms do not begin to resolve within 3 days. SEEK IMMEDIATE MEDICAL CARE IF:   You have severe back pain or lower abdominal pain.  You develop chills.  You have nausea or vomiting.  You have continued burning or discomfort with urination. MAKE SURE YOU:   Understand these instructions.  Will watch your condition.  Will get help right away if you are not doing well or get worse. Document Released: 06/21/2005 Document Revised: 03/12/2012 Document Reviewed: 10/20/2011 ExitCare Patient Information 2014 [employer].  

## 2013-11-25 NOTE — ED Notes (Signed)
Patient transported to X-ray 

## 2013-11-28 ENCOUNTER — Encounter (HOSPITAL_BASED_OUTPATIENT_CLINIC_OR_DEPARTMENT_OTHER): Payer: Self-pay | Admitting: *Deleted

## 2013-11-28 ENCOUNTER — Other Ambulatory Visit: Payer: Self-pay | Admitting: Urology

## 2013-11-28 NOTE — Progress Notes (Signed)
NPO AFTER MN. ARRIVE AT 0600. NEEDS ISTAT , EKG AND KUB.  WILL TAKE PROTONIX , BYSTOLIC AND IF NEEDED WILL TAKE PAIN/ NAUSEA RX AM DOS W/ SIPS OF WATER.

## 2013-12-02 ENCOUNTER — Ambulatory Visit (HOSPITAL_BASED_OUTPATIENT_CLINIC_OR_DEPARTMENT_OTHER): Admission: RE | Admit: 2013-12-02 | Payer: Managed Care, Other (non HMO) | Source: Ambulatory Visit | Admitting: Urology

## 2013-12-02 HISTORY — DX: Personal history of urinary calculi: Z87.442

## 2013-12-02 HISTORY — DX: Gastro-esophageal reflux disease without esophagitis: K21.9

## 2013-12-02 HISTORY — DX: Urgency of urination: R39.15

## 2013-12-02 HISTORY — DX: Calculus of ureter: N20.1

## 2013-12-02 SURGERY — CYSTOURETEROSCOPY, WITH RETROGRADE PYELOGRAM AND STENT INSERTION
Anesthesia: General

## 2014-01-02 ENCOUNTER — Other Ambulatory Visit: Payer: Self-pay | Admitting: Nurse Practitioner

## 2014-01-02 NOTE — Telephone Encounter (Signed)
Patient last seen in office on 08-19-13 for an acute visit. Last chronic follow up on 02-26-13. Please advise

## 2014-01-02 NOTE — Telephone Encounter (Signed)
Patient NTBS for follow up and lab work  

## 2014-03-13 ENCOUNTER — Encounter: Payer: Managed Care, Other (non HMO) | Admitting: Family

## 2014-05-12 ENCOUNTER — Ambulatory Visit (INDEPENDENT_AMBULATORY_CARE_PROVIDER_SITE_OTHER): Payer: 59 | Admitting: Nurse Practitioner

## 2014-05-12 ENCOUNTER — Encounter (INDEPENDENT_AMBULATORY_CARE_PROVIDER_SITE_OTHER): Payer: Self-pay

## 2014-05-12 ENCOUNTER — Encounter: Payer: Self-pay | Admitting: Nurse Practitioner

## 2014-05-12 VITALS — BP 170/103 | HR 72 | Temp 97.3°F | Ht 64.0 in | Wt 249.0 lb

## 2014-05-12 DIAGNOSIS — R635 Abnormal weight gain: Secondary | ICD-10-CM

## 2014-05-12 DIAGNOSIS — Z23 Encounter for immunization: Secondary | ICD-10-CM

## 2014-05-12 DIAGNOSIS — E669 Obesity, unspecified: Secondary | ICD-10-CM

## 2014-05-12 DIAGNOSIS — Z0000 Encounter for general adult medical examination without abnormal findings: Secondary | ICD-10-CM

## 2014-05-12 DIAGNOSIS — Z91010 Allergy to peanuts: Secondary | ICD-10-CM

## 2014-05-12 DIAGNOSIS — G4700 Insomnia, unspecified: Secondary | ICD-10-CM

## 2014-05-12 DIAGNOSIS — I10 Essential (primary) hypertension: Secondary | ICD-10-CM

## 2014-05-12 DIAGNOSIS — K219 Gastro-esophageal reflux disease without esophagitis: Secondary | ICD-10-CM

## 2014-05-12 LAB — POCT CBC
Granulocyte percent: 76 %G (ref 37–80)
HEMATOCRIT: 35.5 % — AB (ref 37.7–47.9)
HEMOGLOBIN: 11.8 g/dL — AB (ref 12.2–16.2)
Lymph, poc: 1.8 (ref 0.6–3.4)
MCH, POC: 29.1 pg (ref 27–31.2)
MCHC: 33.2 g/dL (ref 31.8–35.4)
MCV: 87.5 fL (ref 80–97)
MPV: 7.9 fL (ref 0–99.8)
POC GRANULOCYTE: 7.6 — AB (ref 2–6.9)
POC LYMPH PERCENT: 18.3 %L (ref 10–50)
Platelet Count, POC: 283 10*3/uL (ref 142–424)
RBC: 4.1 M/uL (ref 4.04–5.48)
RDW, POC: 14.4 %
WBC: 10 10*3/uL (ref 4.6–10.2)

## 2014-05-12 MED ORDER — EPINEPHRINE 0.3 MG/0.3ML IJ SOAJ: 0.3000 mg | Freq: Once | INTRAMUSCULAR | Status: DC

## 2014-05-12 MED ORDER — ZOLPIDEM TARTRATE 10 MG PO TABS: 10.0000 mg | ORAL_TABLET | Freq: Every evening | ORAL | Status: DC | PRN

## 2014-05-12 MED ORDER — PANTOPRAZOLE SODIUM 40 MG PO TBEC
40.0000 mg | DELAYED_RELEASE_TABLET | Freq: Two times a day (BID) | ORAL | Status: DC
Start: 2014-05-12 — End: 2014-09-02

## 2014-05-12 MED ORDER — CLOBETASOL PROPIONATE 0.05 % EX CREA: 1.0000 "application " | TOPICAL_CREAM | CUTANEOUS | Status: DC | PRN

## 2014-05-12 MED ORDER — NEBIVOLOL HCL 10 MG PO TABS: 10.0000 mg | ORAL_TABLET | Freq: Every morning | ORAL | Status: DC

## 2014-05-12 MED ORDER — NAPROXEN 500 MG PO TABS: 500.0000 mg | ORAL_TABLET | ORAL | Status: DC | PRN

## 2014-05-12 MED ORDER — CIPROFLOXACIN HCL 500 MG PO TABS
500.0000 mg | ORAL_TABLET | Freq: Two times a day (BID) | ORAL | Status: DC
Start: 2014-05-12 — End: 2014-05-12

## 2014-05-12 NOTE — Progress Notes (Signed)
Subjective:    Patient ID: Ashley Bradford, female    DOB: 1971-07-24, 43 y.o.   MRN: [id number]  Patient here today for cpe without pap.  Goes to OB-Gyn for pap examination.  Hypertension This is a chronic problem. The current episode started more than 1 year ago. The problem has been resolved since onset. The problem is controlled. Pertinent negatives include no blurred vision or peripheral edema. Risk factors for coronary artery disease include obesity. Past treatments include beta blockers. The current treatment provides significant improvement. Compliance problems include diet and exercise.   GERD Taking protonix but has been out of refills and taking OTC meds with no relief.     Review of Systems  Constitutional: Negative.   HENT: Negative.   Eyes: Negative.  Negative for blurred vision.  Respiratory: Negative.   Cardiovascular: Negative.   Gastrointestinal: Negative.   Endocrine: Negative.   Genitourinary: Negative.   Musculoskeletal: Negative.   Skin: Negative.   Allergic/Immunologic: Negative.   Neurological: Negative.   Hematological: Negative.   Psychiatric/Behavioral: Negative.   All other systems reviewed and are negative.      Objective:   Physical Exam  Constitutional: She is oriented to person, place, and time. She appears well-developed and well-nourished.  HENT:  Nose: Nose normal.  Mouth/Throat: Oropharynx is clear and moist.  Eyes: EOM are normal.  Neck: Trachea normal, normal range of motion and full passive range of motion without pain. Neck supple. No JVD present. Carotid bruit is not present. No thyromegaly present.  Cardiovascular: Normal rate, regular rhythm, normal heart sounds and intact distal pulses.  Exam reveals no gallop and no friction rub.   No murmur heard. Pulmonary/Chest: Effort normal and breath sounds normal.  Abdominal: Soft. Bowel sounds are normal. She exhibits no distension and no mass. There is no tenderness.  Musculoskeletal:  Normal range of motion.  Lymphadenopathy:    She has no cervical adenopathy.  Neurological: She is alert and oriented to person, place, and time. She has normal reflexes.  Skin: Skin is warm and dry.  Psychiatric: She has a normal mood and affect. Her behavior is normal. Judgment and thought content normal.    BP 170/103  Pulse 72  Temp(Src) 97.3 F (36.3 C) (Oral)  Ht 5' 4"  (1.626 m)  Wt 249 lb (112.946 kg)  BMI 42.72 kg/m2       Assessment & Plan:   1. Annual physical exam   2. Obesity (BMI 35.0-39.9 without comorbidity)   3. Essential hypertension   4. Gastroesophageal reflux disease without esophagitis   5. Insomnia    Orders Placed This Encounter  Procedures  . CMP14+EGFR  . NMR, lipoprofile  . Thyroid Panel With TSH  . Vit D  25 hydroxy (rtn osteoporosis monitoring)  . POCT CBC   Meds ordered this encounter  Medications  . DISCONTD: naproxen (NAPROSYN) 375 MG tablet    Sig: Take 375 mg by mouth 2 (two) times daily with a meal.  . DISCONTD: ciprofloxacin (CIPRO) 500 MG tablet    Sig: Take 1 tablet (500 mg total) by mouth 2 (two) times daily.    Dispense:  6 tablet    Refill:  0    Order Specific Question:  Supervising Provider    Answer:  Chipper Herb [[id number]]  . pantoprazole (PROTONIX) 40 MG tablet    Sig: Take 1 tablet (40 mg total) by mouth 2 (two) times daily.    Dispense:  180 tablet    Refill:  1    Order Specific Question:  Supervising Provider    Answer:  Chipper Herb [[id number]]  . nebivolol (BYSTOLIC) 10 MG tablet    Sig: Take 1 tablet (10 mg total) by mouth every morning.    Dispense:  90 tablet    Refill:  1    Order Specific Question:  Supervising Provider    Answer:  Chipper Herb [[id number]]  . clobetasol cream (TEMOVATE) 0.05 %    Sig: Apply 1 application topically as needed (HAND).    Dispense:  60 g    Refill:  3    Order Specific Question:  Supervising Provider    Answer:  Chipper Herb [[id number]]  . naproxen (NAPROSYN) 500 MG tablet     Sig: Take 1 tablet (500 mg total) by mouth as needed.    Dispense:  180 tablet    Refill:  0    Order Specific Question:  Supervising Provider    Answer:  Chipper Herb [[id number]]  . zolpidem (AMBIEN) 10 MG tablet    Sig: Take 1 tablet (10 mg total) by mouth at bedtime as needed for sleep.    Dispense:  30 tablet    Refill:  1    Order Specific Question:  Supervising Provider    Answer:  Chipper Herb [[id number]]   Patient to scheduled mammogram Labs pending Health maintenance reviewed Diet and exercise encouraged Continue all meds Follow up  In 6 months   Brimson, FNP

## 2014-05-12 NOTE — Patient Instructions (Signed)

## 2014-05-13 LAB — CMP14+EGFR
A/G RATIO: 2 (ref 1.1–2.5)
ALT: 25 IU/L (ref 0–32)
AST: 16 IU/L (ref 0–40)
Albumin: 4.3 g/dL (ref 3.5–5.5)
Alkaline Phosphatase: 87 IU/L (ref 39–117)
BILIRUBIN TOTAL: 0.3 mg/dL (ref 0.0–1.2)
BUN/Creatinine Ratio: 14 (ref 9–23)
BUN: 11 mg/dL (ref 6–24)
CO2: 24 mmol/L (ref 18–29)
Calcium: 9.3 mg/dL (ref 8.7–10.2)
Chloride: 101 mmol/L (ref 97–108)
Creatinine, Ser: 0.81 mg/dL (ref 0.57–1.00)
GFR, EST AFRICAN AMERICAN: 104 mL/min/{1.73_m2} (ref >59)
GFR, EST NON AFRICAN AMERICAN: 90 mL/min/{1.73_m2} (ref >59)
GLUCOSE: 85 mg/dL (ref 65–99)
Globulin, Total: 2.2 g/dL (ref 1.5–4.5)
Potassium: 4.2 mmol/L (ref 3.5–5.2)
SODIUM: 141 mmol/L (ref 134–144)
TOTAL PROTEIN: 6.5 g/dL (ref 6.0–8.5)

## 2014-05-13 LAB — LIPID PANEL
CHOL/HDL RATIO: 3.2 ratio (ref 0.0–4.4)
Cholesterol, Total: 163 mg/dL (ref 100–199)
HDL: 51 mg/dL (ref >39)
LDL Calculated: 90 mg/dL (ref 0–99)
Triglycerides: 111 mg/dL (ref 0–149)
VLDL Cholesterol Cal: 22 mg/dL (ref 5–40)

## 2014-05-13 LAB — THYROID PANEL WITH TSH
Free Thyroxine Index: 1.8 (ref 1.2–4.9)
T3 Uptake Ratio: 27 % (ref 24–39)
T4, Total: 6.5 ug/dL (ref 4.5–12.0)
TSH: 0.798 u[IU]/mL (ref 0.450–4.500)

## 2014-05-13 LAB — VITAMIN D 25 HYDROXY (VIT D DEFICIENCY, FRACTURES): Vit D, 25-Hydroxy: 28.7 ng/mL — ABNORMAL LOW (ref 30.0–100.0)

## 2014-05-14 ENCOUNTER — Encounter: Payer: Self-pay | Admitting: Family Medicine

## 2014-09-02 ENCOUNTER — Other Ambulatory Visit: Payer: Self-pay | Admitting: Nurse Practitioner

## 2014-09-02 NOTE — Telephone Encounter (Signed)
no more refills without being seen  

## 2014-09-02 NOTE — Telephone Encounter (Signed)
Last ov 05/12/14

## 2014-12-21 ENCOUNTER — Encounter: Payer: Self-pay | Admitting: Family Medicine

## 2014-12-21 ENCOUNTER — Ambulatory Visit (INDEPENDENT_AMBULATORY_CARE_PROVIDER_SITE_OTHER): Payer: 59 | Admitting: Family Medicine

## 2014-12-21 ENCOUNTER — Telehealth: Payer: Self-pay | Admitting: *Deleted

## 2014-12-21 VITALS — BP 124/71 | HR 72 | Temp 97.3°F | Ht 64.0 in | Wt 254.8 lb

## 2014-12-21 DIAGNOSIS — R300 Dysuria: Secondary | ICD-10-CM

## 2014-12-21 DIAGNOSIS — N390 Urinary tract infection, site not specified: Secondary | ICD-10-CM

## 2014-12-21 DIAGNOSIS — N200 Calculus of kidney: Secondary | ICD-10-CM

## 2014-12-21 DIAGNOSIS — R319 Hematuria, unspecified: Secondary | ICD-10-CM

## 2014-12-21 DIAGNOSIS — B3731 Acute candidiasis of vulva and vagina: Secondary | ICD-10-CM

## 2014-12-21 DIAGNOSIS — B373 Candidiasis of vulva and vagina: Secondary | ICD-10-CM

## 2014-12-21 LAB — POCT UA - MICROSCOPIC ONLY
CASTS, UR, LPF, POC: NEGATIVE
Crystals, Ur, HPF, POC: NEGATIVE
Mucus, UA: NEGATIVE
Yeast, UA: NEGATIVE

## 2014-12-21 LAB — POCT URINALYSIS DIPSTICK
Bilirubin, UA: NEGATIVE
Glucose, UA: NEGATIVE
Ketones, UA: NEGATIVE
Nitrite, UA: NEGATIVE
PROTEIN UA: NEGATIVE
SPEC GRAV UA: 1.015
UROBILINOGEN UA: NEGATIVE
pH, UA: 6.5

## 2014-12-21 MED ORDER — CIPROFLOXACIN HCL 500 MG PO TABS: 500.0000 mg | ORAL_TABLET | Freq: Two times a day (BID) | ORAL | Status: DC

## 2014-12-21 MED ORDER — PHENAZOPYRIDINE HCL 200 MG PO TABS: 200.0000 mg | ORAL_TABLET | Freq: Three times a day (TID) | ORAL | Status: DC | PRN

## 2014-12-21 MED ORDER — FLUCONAZOLE 150 MG PO TABS: ORAL_TABLET | ORAL | Status: DC

## 2014-12-21 NOTE — Addendum Note (Signed)
Addended by: Bearl MulberryUTHERFORD, Rakeem Colley K on: 12/21/2014 06:10 PM   Modules accepted: Orders

## 2014-12-21 NOTE — Telephone Encounter (Signed)
Pt needs refill of Bystolic sent into [facility]

## 2014-12-21 NOTE — Patient Instructions (Signed)
Drink plenty of fluids Take antibiotic as directed and recheck urinalysis in 10-14 days, clean catch midstream specimen We will do a culture and sensitivity in case the choice of antibiotics needs to be changed Periodically you should continue to follow-up with the urologist because of the stones.

## 2014-12-21 NOTE — Progress Notes (Signed)
Subjective:    Patient ID: Ashley Bradford, female    DOB: 04/29/1971, 44 y.o.   MRN: [id number]  HPI  Patient is here to day for an acute care visit for urinary frequency with burnig and urgency that started on Saturday. She is also having some dizziness and headaches.          Patient Active Problem List   Diagnosis Date Noted  . Insomnia 05/12/2014  . Hypertension 02/26/2013  . GERD (gastroesophageal reflux disease) 02/26/2013  . Obesity (BMI 35.0-39.9 without comorbidity) 02/26/2013   Outpatient Encounter Prescriptions as of 12/21/2014  Medication Sig  . ALBUTEROL IN Inhale into the lungs as needed.  Marland Kitchen BYSTOLIC 10 MG tablet Take 1 tablet by mouth  every morning  . cetirizine (ZYRTEC) 10 MG tablet Take 10 mg by mouth daily.  . clobetasol cream (TEMOVATE) 0.05 % Apply 1 application topically as needed (HAND).  Marland Kitchen EPINEPHrine (EPIPEN) 0.3 mg/0.3 mL IJ SOAJ injection Inject 0.3 mLs (0.3 mg total) into the muscle once.  Marland Kitchen ibuprofen (ADVIL,MOTRIN) 800 MG tablet TAKE 1 TABLET EVERY 8 HOURSAS NEEDED FOR PAIN  . naproxen (NAPROSYN) 500 MG tablet Take 1 tablet (500 mg total) by mouth as needed.  . pantoprazole (PROTONIX) 40 MG tablet Take 1 tablet by mouth two  times daily  . zolpidem (AMBIEN) 10 MG tablet Take 1 tablet (10 mg total) by mouth at bedtime as needed for sleep.  Marland Kitchen ondansetron (ZOFRAN) 8 MG tablet Take by mouth every 8 (eight) hours as needed for nausea or vomiting.   Review of Systems  Constitutional: Negative for fever.  Eyes: Positive for visual disturbance.  Cardiovascular: Negative.   Gastrointestinal: Negative.  Negative for nausea.  Endocrine: Negative.   Genitourinary: Positive for dysuria (stated on Sat), urgency and frequency.  Musculoskeletal: Negative for back pain.  Neurological: Positive for dizziness and headaches.  Hematological: Negative.   Psychiatric/Behavioral: Negative.        Objective:   Physical Exam  Constitutional: She is oriented to  person, place, and time. She appears well-developed and well-nourished.  HENT:  Head: Normocephalic.  Eyes: Conjunctivae and EOM are normal. Pupils are equal, round, and reactive to light. Right eye exhibits no discharge. Left eye exhibits no discharge.  Neck: Normal range of motion.  Abdominal: Soft. Bowel sounds are normal. There is tenderness. There is no rebound and no guarding.  Suprapubic tenderness left side greater than right side  Musculoskeletal: Normal range of motion.  Neurological: She is alert and oriented to person, place, and time.  Skin: Skin is warm and dry. No rash noted.  Psychiatric: She has a normal mood and affect. Her behavior is normal. Judgment and thought content normal.  Vitals reviewed.  BP 124/71 mmHg  Pulse 72  Temp(Src) 97.3 F (36.3 C) (Oral)  Ht  (1.626 m)  Wt 254 lb 12.8 oz (115.577 kg)  BMI 43.71 kg/m2  LMP 12/13/2014  Results for orders placed or performed in visit on 12/21/14  POCT UA - Microscopic Only  Result Value Ref Range   WBC, Ur, HPF, POC 40-80    RBC, urine, microscopic 5-8    Bacteria, U Microscopic moderate    Mucus, UA negative    Epithelial cells, urine per micros few    Crystals, Ur, HPF, POC negatie    Casts, Ur, LPF, POC negative    Yeast, UA negative   POCT urinalysis dipstick  Result Value Ref Range   Color, UA orange  Clarity, UA clear    Glucose, UA negative    Bilirubin, UA negative    Ketones, UA negative    Spec Grav, UA 1.015    Blood, UA moderate    pH, UA 6.5    Protein, UA negative    Urobilinogen, UA negative    Nitrite, UA negative    Leukocytes, UA large (3+)          Assessment & Plan:  1. Dysuria -Drink plenty of fluids - POCT UA - Microscopic Only - POCT urinalysis dipstick - Urine culture  2. Urinary tract infection with hematuria, site unspecified -Take antibiotic as directed - ciprofloxacin (CIPRO) 500 MG tablet; Take 1 tablet (500 mg total) by mouth 2 (two) times daily.   Dispense: 20 tablet; Refill: 0  3. Nephrolithiasis -Follow-up periodically with neurologist regarding history of kidney stones  Also, a prescription was called in for Diflucan 150 to take 1 daily as needed for yeast infection that may result from taking the antibiotic  Meds ordered this encounter  Medications  . ciprofloxacin (CIPRO) 500 MG tablet    Sig: Take 1 tablet (500 mg total) by mouth 2 (two) times daily.    Dispense:  20 tablet    Refill:  0  . fluconazole (DIFLUCAN) 150 MG tablet    Sig: Take 1 tablet one time and repeat 1 if necessary    Dispense:  2 tablet    Refill:  1   Patient Instructions  Drink plenty of fluids Take antibiotic as directed and recheck urinalysis in 10-14 days, clean catch midstream specimen We will do a culture and sensitivity in case the choice of antibiotics needs to be changed Periodically you should continue to follow-up with the urologist because of the stones.   Nyra Capeson W. Moore MD

## 2014-12-22 ENCOUNTER — Other Ambulatory Visit: Payer: Self-pay | Admitting: *Deleted

## 2014-12-22 MED ORDER — NEBIVOLOL HCL 10 MG PO TABS: ORAL_TABLET | ORAL | Status: DC

## 2014-12-22 MED ORDER — FLUCONAZOLE 150 MG PO TABS: ORAL_TABLET | ORAL | Status: DC

## 2014-12-22 NOTE — Addendum Note (Signed)
Addended by: Magdalene RiverBULLINS, JAMIE H on: 12/22/2014 12:51 PM   Modules accepted: Orders

## 2014-12-23 ENCOUNTER — Telehealth: Payer: Self-pay | Admitting: Family Medicine

## 2014-12-23 LAB — URINE CULTURE: Organism ID, Bacteria: NO GROWTH

## 2014-12-23 NOTE — Telephone Encounter (Signed)
-----   Message from Ernestina Pennaonald W Moore, MD sent at 12/23/2014  8:10 AM EDT ----- The urine culture had no growth. Because of the urinalysis the patient should continue and complete the antibiotic that was prescribed

## 2014-12-24 NOTE — Telephone Encounter (Signed)
Patient aware.

## 2015-02-15 ENCOUNTER — Encounter: Payer: Self-pay | Admitting: Family

## 2015-02-15 ENCOUNTER — Ambulatory Visit (INDEPENDENT_AMBULATORY_CARE_PROVIDER_SITE_OTHER): Payer: 59 | Admitting: Family

## 2015-02-15 VITALS — BP 138/81 | HR 76 | Temp 97.0°F | Wt 251.0 lb

## 2015-02-15 DIAGNOSIS — N390 Urinary tract infection, site not specified: Secondary | ICD-10-CM

## 2015-02-15 DIAGNOSIS — R319 Hematuria, unspecified: Secondary | ICD-10-CM | POA: Diagnosis not present

## 2015-02-15 DIAGNOSIS — R300 Dysuria: Secondary | ICD-10-CM

## 2015-02-15 DIAGNOSIS — S86812A Strain of other muscle(s) and tendon(s) at lower leg level, left leg, initial encounter: Secondary | ICD-10-CM | POA: Diagnosis not present

## 2015-02-15 LAB — POCT URINALYSIS DIPSTICK
BILIRUBIN UA: NEGATIVE
Glucose, UA: NEGATIVE
KETONES UA: NEGATIVE
Nitrite, UA: NEGATIVE
PH UA: 6
Protein, UA: NEGATIVE
SPEC GRAV UA: 1.015
Urobilinogen, UA: NEGATIVE

## 2015-02-15 LAB — POCT UA - MICROSCOPIC ONLY
Bacteria, U Microscopic: NEGATIVE
CASTS, UR, LPF, POC: NEGATIVE
Crystals, Ur, HPF, POC: NEGATIVE
MUCUS UA: NEGATIVE
Yeast, UA: NEGATIVE

## 2015-02-15 MED ORDER — METHYLPREDNISOLONE ACETATE 80 MG/ML IJ SUSP
80.0000 mg | Freq: Once | INTRAMUSCULAR | Status: AC
  Administered 2015-02-15: 80 mg via INTRAMUSCULAR

## 2015-02-15 MED ORDER — CYCLOBENZAPRINE HCL 10 MG PO TABS: 10.0000 mg | ORAL_TABLET | Freq: Three times a day (TID) | ORAL | Status: DC | PRN

## 2015-02-15 MED ORDER — NITROFURANTOIN MONOHYD MACRO 100 MG PO CAPS: 100.0000 mg | ORAL_CAPSULE | Freq: Two times a day (BID) | ORAL | Status: DC

## 2015-02-15 MED ORDER — MELOXICAM 15 MG PO TABS: 15.0000 mg | ORAL_TABLET | Freq: Every day | ORAL | Status: DC

## 2015-02-15 MED ORDER — KETOROLAC TROMETHAMINE 60 MG/2ML IM SOLN
60.0000 mg | Freq: Once | INTRAMUSCULAR | Status: AC
  Administered 2015-02-15: 60 mg via INTRAMUSCULAR

## 2015-02-15 NOTE — Patient Instructions (Addendum)
Muscle Strain °A muscle strain is an injury that occurs when a muscle is stretched beyond its normal length. Usually a small number of muscle fibers are torn when this happens. Muscle strain is rated in degrees. First-degree strains have the least amount of muscle fiber tearing and pain. Second-degree and third-degree strains have increasingly more tearing and pain.  °Usually, recovery from muscle strain takes 1-2 weeks. Complete healing takes 5-6 weeks.  °CAUSES  °Muscle strain happens when a sudden, violent force placed on a muscle stretches it too far. This may occur with lifting, sports, or a fall.  °RISK FACTORS °Muscle strain is especially common in athletes.  °SIGNS AND SYMPTOMS °At the site of the muscle strain, there may be: °· Pain. °· Bruising. °· Swelling. °· Difficulty using the muscle due to pain or lack of normal function. °DIAGNOSIS  °Your health care provider will perform a physical exam and ask about your medical history. °TREATMENT  °Often, the best treatment for a muscle strain is resting, icing, and applying cold compresses to the injured area.   °HOME CARE INSTRUCTIONS  °· Use the PRICE method of treatment to promote muscle healing during the first 2-3 days after your injury. The PRICE method involves: °· Protecting the muscle from being injured again. °· Restricting your activity and resting the injured body part. °· Icing your injury. To do this, put ice in a plastic bag. Place a towel between your skin and the bag. Then, apply the ice and leave it on from 15-20 minutes each hour. After the third day, switch to moist heat packs. °· Apply compression to the injured area with a splint or elastic bandage. Be careful not to wrap it too tightly. This may interfere with blood circulation or increase swelling. °· Elevate the injured body part above the level of your heart as often as you can. °· Only take over-the-counter or prescription medicines for pain, discomfort, or fever as directed by your  health care provider. °· Warming up prior to exercise helps to prevent future muscle strains. °SEEK MEDICAL CARE IF:  °· You have increasing pain or swelling in the injured area. °· You have numbness, tingling, or a significant loss of strength in the injured area. °MAKE SURE YOU:  °· Understand these instructions. °· Will watch your condition. °· Will get help right away if you are not doing well or get worse. °Document Released: 09/11/2005 Document Revised: 07/02/2013 Document Reviewed: 04/10/2013 °ExitCare® Patient Information ©2015 [employer]. This information is not intended to replace advice given to you by your health care provider. Make sure you discuss any questions you have with your health care provider. ° °Urinary Tract Infection °Urinary tract infections (UTIs) can develop anywhere along your urinary tract. Your urinary tract is your body's drainage system for removing wastes and extra water. Your urinary tract includes two kidneys, two ureters, a bladder, and a urethra. Your kidneys are a pair of bean-shaped organs. Each kidney is about the size of your fist. They are located below your ribs, one on each side of your spine. °CAUSES °Infections are caused by microbes, which are microscopic organisms, including fungi, viruses, and bacteria. These organisms are so small that they can only be seen through a microscope. Bacteria are the microbes that most commonly cause UTIs. °SYMPTOMS  °Symptoms of UTIs may vary by age and gender of the patient and by the location of the infection. Symptoms in young women typically include a frequent and intense urge to urinate and a   painful, burning feeling in the bladder or urethra during urination. Older women and men are more likely to be tired, shaky, and weak and have muscle aches and abdominal pain. A fever may mean the infection is in your kidneys. Other symptoms of a kidney infection include pain in your back or sides below the ribs, nausea, and  vomiting. °DIAGNOSIS °To diagnose a UTI, your caregiver will ask you about your symptoms. Your caregiver also will ask to provide a urine sample. The urine sample will be tested for bacteria and white blood cells. White blood cells are made by your body to help fight infection. °TREATMENT  °Typically, UTIs can be treated with medication. Because most UTIs are caused by a bacterial infection, they usually can be treated with the use of antibiotics. The choice of antibiotic and length of treatment depend on your symptoms and the type of bacteria causing your infection. °HOME CARE INSTRUCTIONS °· If you were prescribed antibiotics, take them exactly as your caregiver instructs you. Finish the medication even if you feel better after you have only taken some of the medication. °· Drink enough water and fluids to keep your urine clear or pale yellow. °· Avoid caffeine, tea, and carbonated beverages. They tend to irritate your bladder. °· Empty your bladder often. Avoid holding urine for long periods of time. °· Empty your bladder before and after sexual intercourse. °· After a bowel movement, women should cleanse from front to back. Use each tissue only once. °SEEK MEDICAL CARE IF:  °· You have back pain. °· You develop a fever. °· Your symptoms do not begin to resolve within 3 days. °SEEK IMMEDIATE MEDICAL CARE IF:  °· You have severe back pain or lower abdominal pain. °· You develop chills. °· You have nausea or vomiting. °· You have continued burning or discomfort with urination. °MAKE SURE YOU:  °· Understand these instructions. °· Will watch your condition. °· Will get help right away if you are not doing well or get worse. °Document Released: 06/21/2005 Document Revised: 03/12/2012 Document Reviewed: 10/20/2011 °ExitCare® Patient Information ©2015 [employer]. This information is not intended to replace advice given to you by your health care provider. Make sure you discuss any questions you have with your health  care provider. ° °

## 2015-02-15 NOTE — Progress Notes (Signed)
Subjective:    Patient ID: Ashley Bradford, female    DOB: 12/26/1970, 44 y.o.   MRN: [id number]  Pt presents to the office to recheck a UTI. Pt reports feeling a lot better. Pt states she finished her cipro a few weeks ago.  Leg Pain  The incident occurred 12 to 24 hours ago. The incident occurred in the yard. There was no injury mechanism. The pain is present in the left leg (Left calf). The quality of the pain is described as aching. The pain is at a severity of 8/10. The pain is mild. The pain has been intermittent since onset. Pertinent negatives include no loss of motion, muscle weakness, numbness or tingling. She reports no foreign bodies present. The symptoms are aggravated by movement (flexion and extension). She has tried NSAIDs for the symptoms. The treatment provided no relief.      Review of Systems  Constitutional: Negative.   HENT: Negative.   Eyes: Negative.   Respiratory: Negative.  Negative for shortness of breath.   Cardiovascular: Negative.  Negative for palpitations.  Gastrointestinal: Negative.   Endocrine: Negative.   Genitourinary: Negative.   Musculoskeletal: Negative.   Neurological: Negative.  Negative for tingling, numbness and headaches.  Hematological: Negative.   Psychiatric/Behavioral: Negative.   All other systems reviewed and are negative.      Objective:   Physical Exam  Constitutional: She is oriented to person, place, and time. She appears well-developed and well-nourished. No distress.  HENT:  Head: Normocephalic and atraumatic.  Right Ear: External ear normal.  Left Ear: External ear normal.  Nose: Nose normal.  Mouth/Throat: Oropharynx is clear and moist.  Eyes: Pupils are equal, round, and reactive to light.  Neck: Normal range of motion. Neck supple. No thyromegaly present.  Cardiovascular: Normal rate, regular rhythm, normal heart sounds and intact distal pulses.   No murmur heard. Pulmonary/Chest: Effort normal and breath sounds  normal. No respiratory distress. She has no wheezes.  Abdominal: Soft. Bowel sounds are normal. She exhibits no distension. There is no tenderness.  Musculoskeletal: Normal range of motion. She exhibits no edema or tenderness.  Negative for CVA tenderness   Neurological: She is alert and oriented to person, place, and time. She has normal reflexes. No cranial nerve deficit.  Skin: Skin is warm and dry.  Psychiatric: She has a normal mood and affect. Her behavior is normal. Judgment and thought content normal.  Vitals reviewed.   BP 138/81 mmHg  Pulse 76  Temp(Src) 97 F (36.1 C) (Oral)  Wt 251 lb (113.853 kg)       Assessment & Plan:  1. Dysuria - POCT UA - Microscopic Only - POCT urinalysis dipstick  2. Strain of calf muscle, left, initial encounter -Rest -Ice -Elevated - ketorolac (TORADOL) injection 60 mg; Inject 2 mLs (60 mg total) into the muscle once. - methylPREDNISolone acetate (DEPO-MEDROL) injection 80 mg; Inject 1 mL (80 mg total) into the muscle once. - cyclobenzaprine (FLEXERIL) 10 MG tablet; Take 1 tablet (10 mg total) by mouth 3 (three) times daily as needed for muscle spasms.  Dispense: 30 tablet; Refill: 0 - meloxicam (MOBIC) 15 MG tablet; Take 1 tablet (15 mg total) by mouth daily.  Dispense: 30 tablet; Refill: 0  3. Urinary tract infection with hematuria, site unspecified -Force fluids AZO over the counter X2 days RTO prn Culture pending - nitrofurantoin, macrocrystal-monohydrate, (MACROBID) 100 MG capsule; Take 1 capsule (100 mg total) by mouth 2 (two) times daily.  Dispense: 10  capsule; Refill: 0  Jannifer Rodneyhristy Hawks, FNP

## 2015-02-17 LAB — URINE CULTURE

## 2015-03-16 ENCOUNTER — Other Ambulatory Visit: Payer: Self-pay | Admitting: Nurse Practitioner

## 2015-03-22 ENCOUNTER — Other Ambulatory Visit: Payer: Self-pay | Admitting: Family

## 2015-03-23 ENCOUNTER — Encounter: Payer: Self-pay | Admitting: Physician Assistant

## 2015-03-23 ENCOUNTER — Ambulatory Visit (INDEPENDENT_AMBULATORY_CARE_PROVIDER_SITE_OTHER): Payer: 59 | Admitting: Physician Assistant

## 2015-03-23 VITALS — BP 149/79 | HR 64 | Temp 98.6°F | Ht 64.0 in | Wt 241.0 lb

## 2015-03-23 DIAGNOSIS — R198 Other specified symptoms and signs involving the digestive system and abdomen: Secondary | ICD-10-CM | POA: Diagnosis not present

## 2015-03-23 DIAGNOSIS — K649 Unspecified hemorrhoids: Secondary | ICD-10-CM

## 2015-03-23 MED ORDER — HYDROCORTISONE ACETATE 25 MG RE SUPP: RECTAL | Status: DC

## 2015-03-23 NOTE — Patient Instructions (Signed)
Sitz Bath  A sitz bath is a warm water bath taken in the sitting position that covers only the hips and buttocks. It may be used for either healing or hygiene purposes. Sitz baths are also used to relieve pain, itching, or muscle spasms. The water may contain medicine. Moist heat will help you heal and relax.   HOME CARE INSTRUCTIONS   Take 3 to 4 sitz baths a day.  1. Fill the bathtub half full with warm water.  2. Sit in the water and open the drain a little.  3. Turn on the warm water to keep the tub half full. Keep the water running constantly.  4. Soak in the water for 15 to 20 minutes.  5. After the sitz bath, pat the affected area dry first.  SEEK MEDICAL CARE IF:   You get worse instead of better. Stop the sitz baths if you get worse.  MAKE SURE YOU:   Understand these instructions.   Will watch your condition.   Will get help right away if you are not doing well or get worse.  Document Released: 06/03/2004 Document Revised: 06/05/2012 Document Reviewed: 12/09/2010  ExitCare Patient Information 2015 [employer]. This information is not intended to replace advice given to you by your health care provider. Make sure you discuss any questions you have with your health care provider.  Hemorrhoids  Hemorrhoids are swollen veins around the rectum or anus. There are two types of hemorrhoids:   6. Internal hemorrhoids. These occur in the veins just inside the rectum. They may poke through to the outside and become irritated and painful.  7. External hemorrhoids. These occur in the veins outside the anus and can be felt as a painful swelling or hard lump near the anus.  CAUSES   Pregnancy.    Obesity.    Constipation or diarrhea.    Straining to have a bowel movement.    Sitting for long periods on the toilet.   Heavy lifting or other activity that caused you to strain.   Anal intercourse.  SYMPTOMS    Pain.    Anal itching or irritation.    Rectal bleeding.    Fecal leakage.    Anal  swelling.    One or more lumps around the anus.   DIAGNOSIS   Your caregiver may be able to diagnose hemorrhoids by visual examination. Other examinations or tests that may be performed include:    Examination of the rectal area with a gloved hand (digital rectal exam).    Examination of anal canal using a small tube (scope).    A blood test if you have lost a significant amount of blood.   A test to look inside the colon (sigmoidoscopy or colonoscopy).  TREATMENT  Most hemorrhoids can be treated at home. However, if symptoms do not seem to be getting better or if you have a lot of rectal bleeding, your caregiver may perform a procedure to help make the hemorrhoids get smaller or remove them completely. Possible treatments include:    Placing a rubber band at the base of the hemorrhoid to cut off the circulation (rubber band ligation).    Injecting a chemical to shrink the hemorrhoid (sclerotherapy).    Using a tool to burn the hemorrhoid (infrared light therapy).    Surgically removing the hemorrhoid (hemorrhoidectomy).    Stapling the hemorrhoid to block blood flow to the tissue (hemorrhoid stapling).   HOME CARE INSTRUCTIONS    Eat foods with   fiber, such as whole grains, beans, nuts, fruits, and vegetables. Ask your doctor about taking products with added fiber in them (fibersupplements).   Increase fluid intake. Drink enough water and fluids to keep your urine clear or pale yellow.    Exercise regularly.    Go to the bathroom when you have the urge to have a bowel movement. Do not wait.    Avoid straining to have bowel movements.    Keep the anal area dry and clean. Use wet toilet paper or moist towelettes after a bowel movement.    Medicated creams and suppositories may be used or applied as directed.    Only take over-the-counter or prescription medicines as directed by your caregiver.    Take warm sitz baths for 15-20 minutes, 3-4 times a day to ease pain and discomfort.     Place ice packs on the hemorrhoids if they are tender and swollen. Using ice packs between sitz baths may be helpful.    Put ice in a plastic bag.    Place a towel between your skin and the bag.    Leave the ice on for 15-20 minutes, 3-4 times a day.    Do not use a donut-shaped pillow or sit on the toilet for long periods. This increases blood pooling and pain.   SEEK MEDICAL CARE IF:   You have increasing pain and swelling that is not controlled by treatment or medicine.   You have uncontrolled bleeding.   You have difficulty or you are unable to have a bowel movement.   You have pain or inflammation outside the area of the hemorrhoids.  MAKE SURE YOU:   Understand these instructions.   Will watch your condition.   Will get help right away if you are not doing well or get worse.  Document Released: 09/08/2000 Document Revised: 08/28/2012 Document Reviewed: 07/16/2012  ExitCare Patient Information 2015 [employer]. This information is not intended to replace advice given to you by your health care provider. Make sure you discuss any questions you have with your health care provider.

## 2015-03-23 NOTE — Progress Notes (Signed)
   Subjective:    Patient ID: Ashley Bradford, female    DOB: 08/15/1971, 44 y.o.   MRN: [id number]  HPI 44 y/o female presents with c/o alternating constipation and diarrhea. She had constipation for years and now she has frequent episodes of diarrhea, loose stools, which has caused hemorrhoids. She has tried Preparation H with no relief.    Review of Systems  Constitutional: Negative for fever.  Gastrointestinal: Positive for diarrhea, anal bleeding (with bowel movements) and rectal pain (with bowel movements). Negative for abdominal pain.       Objective:   Physical Exam  Constitutional: She is oriented to person, place, and time. She appears well-developed and well-nourished. No distress.  Pulmonary/Chest: Effort normal.  Neurological: She is alert and oriented to person, place, and time.  Skin: She is not diaphoretic.  Psychiatric: She has a normal mood and affect. Her behavior is normal. Judgment and thought content normal.  Nursing note and vitals reviewed.         Assessment & Plan:  1. Hemorrhoids, unspecified hemorrhoid type  - hydrocortisone (ANUSOL-HC) 25 MG suppository; Place 1 suppository rectally BID-TID prn  Dispense: 24 suppository; Refill: 1  2. Alternating constipation and diarrhea  - [facility]  I will see patient back in 1 week. If no improvement of hemorrhoids, will likely refer to General Surgery  Ashley Bradford A. Chauncey ReadingGann PA-C

## 2015-03-27 ENCOUNTER — Telehealth: Payer: Self-pay | Admitting: Physician Assistant

## 2015-03-27 DIAGNOSIS — K649 Unspecified hemorrhoids: Secondary | ICD-10-CM

## 2015-03-28 ENCOUNTER — Emergency Department (HOSPITAL_COMMUNITY)
Admission: EM | Admit: 2015-03-28 | Discharge: 2015-03-28 | Disposition: A | Discharge status: Home / Self Care | Payer: Managed Care, Other (non HMO) | Attending: Emergency Medicine | Admitting: Emergency Medicine

## 2015-03-28 ENCOUNTER — Encounter (HOSPITAL_COMMUNITY): Payer: Self-pay | Admitting: *Deleted

## 2015-03-28 DIAGNOSIS — Z79899 Other long term (current) drug therapy: Secondary | ICD-10-CM | POA: Insufficient documentation

## 2015-03-28 DIAGNOSIS — K219 Gastro-esophageal reflux disease without esophagitis: Secondary | ICD-10-CM | POA: Insufficient documentation

## 2015-03-28 DIAGNOSIS — K644 Residual hemorrhoidal skin tags: Secondary | ICD-10-CM | POA: Insufficient documentation

## 2015-03-28 DIAGNOSIS — Z87442 Personal history of urinary calculi: Secondary | ICD-10-CM | POA: Insufficient documentation

## 2015-03-28 DIAGNOSIS — I10 Essential (primary) hypertension: Secondary | ICD-10-CM | POA: Insufficient documentation

## 2015-03-28 DIAGNOSIS — K649 Unspecified hemorrhoids: Secondary | ICD-10-CM

## 2015-03-28 DIAGNOSIS — Z7952 Long term (current) use of systemic steroids: Secondary | ICD-10-CM | POA: Insufficient documentation

## 2015-03-28 DIAGNOSIS — J45909 Unspecified asthma, uncomplicated: Secondary | ICD-10-CM | POA: Insufficient documentation

## 2015-03-28 MED ORDER — LIDOCAINE HCL 2 % EX GEL
CUTANEOUS | Status: AC
  Filled 2015-03-28: qty 10

## 2015-03-28 MED ORDER — LIDOCAINE HCL 2 % EX GEL: 1.0000 "application " | Freq: Two times a day (BID) | CUTANEOUS | Status: DC

## 2015-03-28 MED ORDER — LIDOCAINE HCL 2 % EX GEL
1.0000 "application " | Freq: Once | CUTANEOUS | Status: AC
  Administered 2015-03-28: 1 via TOPICAL

## 2015-03-28 MED ORDER — LIDOCAINE HCL 2 % EX GEL
CUTANEOUS | Status: AC
  Administered 2015-03-28: 1 via TOPICAL
  Filled 2015-03-28: qty 10

## 2015-03-28 NOTE — Discharge Instructions (Signed)
You were seen today and found to have hemorrhoids. You should continue sitz baths at home and stool softeners. You will be given lidocaine jelly and can use that twice daily.  Hemorrhoids Hemorrhoids are swollen veins around the rectum or anus. There are two types of hemorrhoids:   Internal hemorrhoids. These occur in the veins just inside the rectum. They may poke through to the outside and become irritated and painful.  External hemorrhoids. These occur in the veins outside the anus and can be felt as a painful swelling or hard lump near the anus. CAUSES  Pregnancy.   Obesity.   Constipation or diarrhea.   Straining to have a bowel movement.   Sitting for long periods on the toilet.  Heavy lifting or other activity that caused you to strain.  Anal intercourse. SYMPTOMS   Pain.   Anal itching or irritation.   Rectal bleeding.   Fecal leakage.   Anal swelling.   One or more lumps around the anus.  DIAGNOSIS  Your caregiver may be able to diagnose hemorrhoids by visual examination. Other examinations or tests that may be performed include:   Examination of the rectal area with a gloved hand (digital rectal exam).   Examination of anal canal using a small tube (scope).   A blood test if you have lost a significant amount of blood.  A test to look inside the colon (sigmoidoscopy or colonoscopy). TREATMENT Most hemorrhoids can be treated at home. However, if symptoms do not seem to be getting better or if you have a lot of rectal bleeding, your caregiver may perform a procedure to help make the hemorrhoids get smaller or remove them completely. Possible treatments include:   Placing a rubber band at the base of the hemorrhoid to cut off the circulation (rubber band ligation).   Injecting a chemical to shrink the hemorrhoid (sclerotherapy).   Using a tool to burn the hemorrhoid (infrared light therapy).   Surgically removing the hemorrhoid  (hemorrhoidectomy).   Stapling the hemorrhoid to block blood flow to the tissue (hemorrhoid stapling).  HOME CARE INSTRUCTIONS   Eat foods with fiber, such as whole grains, beans, nuts, fruits, and vegetables. Ask your doctor about taking products with added fiber in them (fibersupplements).  Increase fluid intake. Drink enough water and fluids to keep your urine clear or pale yellow.   Exercise regularly.   Go to the bathroom when you have the urge to have a bowel movement. Do not wait.   Avoid straining to have bowel movements.   Keep the anal area dry and clean. Use wet toilet paper or moist towelettes after a bowel movement.   Medicated creams and suppositories may be used or applied as directed.   Only take over-the-counter or prescription medicines as directed by your caregiver.   Take warm sitz baths for 15-20 minutes, 3-4 times a day to ease pain and discomfort.   Place ice packs on the hemorrhoids if they are tender and swollen. Using ice packs between sitz baths may be helpful.   Put ice in a plastic bag.   Place a towel between your skin and the bag.   Leave the ice on for 15-20 minutes, 3-4 times a day.   Do not use a donut-shaped pillow or sit on the toilet for long periods. This increases blood pooling and pain.  SEEK MEDICAL CARE IF:  You have increasing pain and swelling that is not controlled by treatment or medicine.  You have uncontrolled bleeding.  You have difficulty or you are unable to have a bowel movement.  You have pain or inflammation outside the area of the hemorrhoids. MAKE SURE YOU:  Understand these instructions.  Will watch your condition.  Will get help right away if you are not doing well or get worse. Document Released: 09/08/2000 Document Revised: 08/28/2012 Document Reviewed: 07/16/2012 [facility] Patient Information 2015 [place], [place]. This information is not intended to replace advice given to you by your health  care provider. Make sure you discuss any questions you have with your health care provider.

## 2015-03-28 NOTE — ED Notes (Signed)
Pt c/o hemorrhoids that started about a week and half ago, was seen by [facility], prescribed suppositories with no relief in pain,

## 2015-03-28 NOTE — ED Provider Notes (Signed)
CSN: [id number]     Arrival date & time 03/28/15  0320 History   First MD Initiated Contact with Patient 03/28/15 0330     Chief Complaint  Patient presents with  . Rectal Pain     (Consider location/radiation/quality/duration/timing/severity/associated sxs/prior Treatment) HPI  This is a 44 year old female who presents with hemorrhoids. Patient reports that she was seen and evaluated by her primary physician on Tuesday. She was prescribed Anusol suppositories. She states that they seem to make her symptoms worsen not better. She states that approximately 10 days ago she noted pain worse with defecation. She is also noted some blood in her stool.  Over the last several days pain has worsened. She did at one point become constipated requiring an enema. She states that her pain has been so bad that she did take a Norco at home with minimal relief. Current pain is 10 out of 10. Worse with sitting and bowel movements.  Past Medical History  Diagnosis Date  . Hypertension   . Asthma   . Left ureteral calculus   . History of kidney stones   . GERD (gastroesophageal reflux disease)   . Urgency of urination    Past Surgical History  Procedure Laterality Date  . Nephrolithotomy Right 1996  . Cesarean section  07-02-2001 &   1997    W/  BILATERAL TUBAL LIGATION  . Breast reduction surgery  2005  . Abdominoplasty/panniculectomy  2004   Family History  Problem Relation Age of Onset  . Hypertension Mother   . Hypertension Father    History  Substance Use Topics  . Smoking status: Never Smoker   . Smokeless tobacco: Never Used  . Alcohol Use: Yes     Comment: occasionally   OB History    No data available     Review of Systems  Gastrointestinal: Positive for constipation, anal bleeding and rectal pain. Negative for nausea and vomiting.  Genitourinary: Negative for dysuria.  All other systems reviewed and are negative.     Allergies  Peanut-containing drug products;  Hydromorphone; Morphine and related; and Sulfa antibiotics  Home Medications   Prior to Admission medications   Medication Sig Start Date End Date Taking? Authorizing Provider  ALBUTEROL IN Inhale into the lungs as needed.    Historical Provider, MD  cetirizine (ZYRTEC) 10 MG tablet Take 10 mg by mouth daily.    Historical Provider, MD  clobetasol cream (TEMOVATE) 0.05 % Apply 1 application topically as needed (HAND). 05/12/14   Mary-Margaret Daphine DeutscherMartin, FNP  EPINEPHrine (EPIPEN) 0.3 mg/0.3 mL IJ SOAJ injection Inject 0.3 mLs (0.3 mg total) into the muscle once. 05/12/14   Mary-Margaret Daphine DeutscherMartin, FNP  hydrocortisone (ANUSOL-HC) 25 MG suppository Place 1 suppository rectally BID-TID prn 03/23/15   Tiffany A Gann, PA-C  ibuprofen (ADVIL,MOTRIN) 800 MG tablet TAKE 1 TABLET EVERY 8 HOURSAS NEEDED FOR PAIN 01/02/14   Mary-Margaret Daphine DeutscherMartin, FNP  lidocaine (XYLOCAINE) 2 % jelly Apply 1 application topically 2 (two) times daily. 03/28/15   Shon Batonourtney F Horton, MD  LORazepam (ATIVAN) 1 MG tablet  12/09/14   Historical Provider, MD  meloxicam (MOBIC) 15 MG tablet TAKE 1 TABLET (15 MG TOTAL) BY MOUTH DAILY. 03/22/15   Junie Spencerhristy A Hawks, FNP  methylphenidate (RITALIN) 20 MG tablet  12/11/14   Historical Provider, MD  naproxen (NAPROSYN) 500 MG tablet Take 1 tablet (500 mg total) by mouth as needed. 05/12/14   Mary-Margaret Daphine DeutscherMartin, FNP  nebivolol (BYSTOLIC) 10 MG tablet Take 1 tablet by mouth  every morning 12/22/14   Mary-Margaret Daphine Deutscher, FNP  pantoprazole (PROTONIX) 40 MG tablet Take 1 tablet by mouth two  times daily 03/17/15   Junie Spencer, FNP  zolpidem (AMBIEN) 10 MG tablet Take 1 tablet (10 mg total) by mouth at bedtime as needed for sleep. 05/12/14   Mary-Margaret Daphine Deutscher, FNP   BP 109/79 mmHg  Pulse 87  Temp(Src) 98.1 F (36.7 C) (Oral)  Resp 20  Ht  (1.626 m)  Wt 240 lb (108.863 kg)  BMI 41.18 kg/m2  SpO2 98%  LMP 03/02/2015 (Approximate) Physical Exam  Constitutional: She is oriented to person, place,  and time. She appears well-developed and well-nourished.  HENT:  Head: Normocephalic and atraumatic.  Cardiovascular: Normal rate and regular rhythm.   Pulmonary/Chest: Effort normal. No respiratory distress.  Genitourinary:  2 large external hemorrhoids without evidence of thrombosis, no active bleeding noted  Neurological: She is alert and oriented to person, place, and time.  Skin: Skin is warm and dry.  Psychiatric: She has a normal mood and affect.  Nursing note and vitals reviewed.   ED Course  Procedures (including critical care time) Labs Review Labs Reviewed - No data to display  Imaging Review No results found.   EKG Interpretation None      MDM   Final diagnoses:  Hemorrhoids, unspecified hemorrhoid type    Patient presents with hemorrhoid pain. Reports that Anusol is not helping. Nontoxic on exam. No active bleeding noted. She does have evidence of 2 large external hemorrhoids. Lidocaine jelly was provided to the patient. Discuss with patient continued supportive care at home. She should avoid narcotic medication or any other medication that would constipate her. She should take a stool softener and continue sitz baths. She will be given surgery referral.  After history, exam, and medical workup I feel the patient has been appropriately medically screened and is safe for discharge home. Pertinent diagnoses were discussed with the patient. Patient was given return precautions.     Shon Baton, MD 03/28/15 [phone]

## 2015-03-30 MED ORDER — HYDROCODONE-ACETAMINOPHEN 5-325 MG PO TABS: ORAL_TABLET | ORAL | Status: DC

## 2015-03-30 NOTE — Telephone Encounter (Signed)
Patient isn't getting any relief from hemorrhoid pain. The pain is severe and constant. She was seen in the ER over the weekend and told she needs surgery. She was given lidocaine jelly and it hasn't really helped either.  [facility] and was told they could see her on the 10th or 15th. She wants to know if she can get some pain medication to help until then. She doesn't take pain medication often but Vicodin normally works well for her.  Elmarie Shileyiffany is out of the office today. Will you review?

## 2015-03-30 NOTE — Telephone Encounter (Signed)
[facility] and see if they can see this patient sooner

## 2015-03-30 NOTE — Telephone Encounter (Signed)
Patient aware script is ready for pickup. Also advised that this can cause constipation and worsen hemorrhoids. Advised to drink plenty of water and take a stool softener.

## 2015-03-30 NOTE — Telephone Encounter (Signed)
I can call and see if they can get her in sooner. In the meantime are you able to write her a prescription for vicodin?

## 2015-03-30 NOTE — Addendum Note (Signed)
Addended by: Gwenith DailyHUDY, Gawain Crombie N on: 03/30/2015 01:24 PM   Modules accepted: Orders

## 2015-03-30 NOTE — Telephone Encounter (Signed)
Pain pills will make her more constipated and will aggravate the hemorrhoid issue She can do Vicodin 5/325 1/2-1 every 6-8 hours as needed and give her #15

## 2015-04-02 ENCOUNTER — Ambulatory Visit: Payer: 59 | Admitting: Physician Assistant

## 2015-04-19 ENCOUNTER — Encounter: Payer: Self-pay | Admitting: General Surgery

## 2015-04-19 NOTE — Progress Notes (Signed)
Ashley Bradford 04/19/2015 5:04 PM Location: [facility] Patient #: [id number] DOB: May 30, 1971 Single / Language: [place] / Race: White Female History of Present Illness Adolph Pollack MD; 04/19/2015 5:31 PM) Patient words: 1 month f/u fissure.  The patient is a 44 year old female    Note:She is here for follow-up of her infected anal fissure. She is to be feeling a little better but the last day or 2 has had some achiness and low-grade fever. She is afebrile here in the office. She started to have a little feculent discharge, she thinks, from the abscess drainage site.  Allergies Fay Records, CMA; 04/19/2015 5:05 PM) Peanut Oil *CHEMICALS* Morphine Sulfate (Concentrate) *ANALGESICS - OPIOID* Sulfa 10 *OPHTHALMIC AGENTS*  Medication History Fay Records, CMA; 04/19/2015 5:05 PM) Cipro (  Tablet, 1 (one) Tablet Oral every twelve hours, Taken starting 04/06/2015) Active. Flagyl (  Tablet, 1 (one) Tablet Oral three times daily, Taken starting 04/06/2015) Active. Meloxicam (  Tablet, Oral) Active. Albuterol Sulfate (0.63MG /3ML Nebulized Soln, Inhalation) Active. ZyrTEC Childrens Allergy (  Tablet Chewable, Oral) Active. Clobetasol Propionate E (0.05% Cream, External) Active. EpiPen 2-Pak (0.3 MG/0.3ML(1:1000) Device, Intramuscular) Active. Norco (5-325MG  Tablet, Oral) Active. Anusol-HC (  Suppository, Rectal) Active. Ibuprofen (  Tablet, Oral) Active. Lidocaine HCl (2% Gel, External) Active. LORazepam (  Tablet, Oral) Active. Methylphenidate HCl (  Tablet, Oral) Active. Naproxen (  Tablet, Oral) Active. Bystolic (  Tablet, Oral) Active. Protonix (  Packet, Oral) Active. Ambien (  Tablet, Oral) Active. Medications Reconciled    Vitals Fay Records CMA; 04/19/2015 5:05 PM) 04/19/2015 5:05 PM Weight: 244 lb Height: 64in Body Surface Area: 2.24 m Body Mass Index: 41.88 kg/m Temp.: 98.73F(Oral)   Pulse: 89 (Regular)  BP: 128/70 (Sitting, Left Arm, Standard)     Physical Exam Adolph Pollack MD; 04/19/2015 5:33 PM)  The physical exam findings are as follows: Note:Anorectal-small punctate wound posteriorly with some yellowish drainage. On digital rectal exam there is a large posterior fissure with an irregular cavity.    Assessment & Plan Adolph Pollack MD; 04/19/2015 5:30 PM)  ABSCESS OF ANORECTAL FISSURE (566  K61.2) Impression: She has evidence of a possible anal fistula now.  Plan: I recommended exam under anesthesia, possible biopsy as well as repair of anal fissure due to low likely by seton. We discussed the procedure rationale and risks. Risks include but are not limited to bleeding, infection recurring, incontinence, anesthesia, need for other operations. She seems to understand this and agrees with the plan.  Avel Peace, MD

## 2015-04-20 ENCOUNTER — Encounter (HOSPITAL_BASED_OUTPATIENT_CLINIC_OR_DEPARTMENT_OTHER): Payer: Self-pay | Admitting: *Deleted

## 2015-04-22 ENCOUNTER — Ambulatory Visit (HOSPITAL_BASED_OUTPATIENT_CLINIC_OR_DEPARTMENT_OTHER)
Admission: RE | Admit: 2015-04-22 | Payer: Managed Care, Other (non HMO) | Source: Ambulatory Visit | Admitting: General Surgery

## 2015-04-22 HISTORY — DX: Headache: R51

## 2015-04-22 HISTORY — DX: Headache, unspecified: R51.9

## 2015-04-22 SURGERY — EXAM UNDER ANESTHESIA
Anesthesia: General

## 2015-05-10 ENCOUNTER — Other Ambulatory Visit: Payer: Self-pay | Admitting: Family

## 2015-06-01 ENCOUNTER — Other Ambulatory Visit: Payer: Self-pay | Admitting: Family

## 2015-06-02 MED ORDER — NEBIVOLOL HCL 10 MG PO TABS: ORAL_TABLET | ORAL | Status: DC

## 2015-06-03 LAB — HM PAP SMEAR

## 2015-06-04 ENCOUNTER — Other Ambulatory Visit: Payer: Self-pay

## 2015-06-04 ENCOUNTER — Telehealth: Payer: Self-pay | Admitting: Family

## 2015-06-04 DIAGNOSIS — Z91010 Allergy to peanuts: Secondary | ICD-10-CM

## 2015-06-04 MED ORDER — EPINEPHRINE 0.3 MG/0.3ML IJ SOAJ: 0.3000 mg | Freq: Once | INTRAMUSCULAR | Status: DC

## 2015-06-04 MED ORDER — NEBIVOLOL HCL 10 MG PO TABS: ORAL_TABLET | ORAL | Status: DC

## 2015-07-13 ENCOUNTER — Ambulatory Visit (INDEPENDENT_AMBULATORY_CARE_PROVIDER_SITE_OTHER): Payer: BLUE CROSS/BLUE SHIELD | Admitting: Nurse Practitioner

## 2015-07-13 ENCOUNTER — Encounter: Payer: Self-pay | Admitting: Nurse Practitioner

## 2015-07-13 VITALS — BP 125/87 | HR 70 | Temp 97.1°F | Ht 64.0 in | Wt 236.0 lb

## 2015-07-13 DIAGNOSIS — G4700 Insomnia, unspecified: Secondary | ICD-10-CM

## 2015-07-13 DIAGNOSIS — Z0000 Encounter for general adult medical examination without abnormal findings: Secondary | ICD-10-CM | POA: Diagnosis not present

## 2015-07-13 DIAGNOSIS — M545 Low back pain, unspecified: Secondary | ICD-10-CM

## 2015-07-13 DIAGNOSIS — K219 Gastro-esophageal reflux disease without esophagitis: Secondary | ICD-10-CM

## 2015-07-13 DIAGNOSIS — E669 Obesity, unspecified: Secondary | ICD-10-CM

## 2015-07-13 DIAGNOSIS — I10 Essential (primary) hypertension: Secondary | ICD-10-CM

## 2015-07-13 MED ORDER — IBUPROFEN 800 MG PO TABS: ORAL_TABLET | ORAL | Status: DC

## 2015-07-13 MED ORDER — NEBIVOLOL HCL 10 MG PO TABS: ORAL_TABLET | ORAL | Status: DC

## 2015-07-13 MED ORDER — ZOLPIDEM TARTRATE 10 MG PO TABS: 10.0000 mg | ORAL_TABLET | Freq: Every evening | ORAL | Status: DC | PRN

## 2015-07-13 NOTE — Progress Notes (Signed)
  Subjective:    Patient ID: Ashley Bradford, female    DOB: [birth year], 44 y.o.   MRN: [id number]  Patient here today for cpe without pap.    Hypertension This is a chronic problem. The current episode started more than 1 year ago. The problem has been gradually improving since onset. The problem is controlled. Pertinent negatives include no blurred vision, chest pain or headaches. There are no associated agents to hypertension. Risk factors for coronary artery disease include obesity. There are no compliance problems.   GERD Taking Protonix with symptom relief.     Review of Systems  Constitutional: Negative.   HENT: Negative.   Eyes: Negative.  Negative for blurred vision.  Respiratory: Negative.   Cardiovascular: Negative.  Negative for chest pain.  Gastrointestinal: Negative.   Endocrine: Negative.   Genitourinary: Negative.   Musculoskeletal: Negative.   Skin: Negative.   Allergic/Immunologic: Negative.   Neurological: Negative.  Negative for headaches.  Hematological: Negative.   Psychiatric/Behavioral: Negative.   All other systems reviewed and are negative.      Objective:   Physical Exam  Constitutional: She is oriented to person, place, and time. She appears well-developed and well-nourished.  HENT:  Nose: Nose normal.  Mouth/Throat: Oropharynx is clear and moist.  Eyes: EOM are normal.  Neck: Trachea normal, normal range of motion and full passive range of motion without pain. Neck supple. No JVD present. Carotid bruit is not present. No thyromegaly present.  Cardiovascular: Normal rate, regular rhythm, normal heart sounds and intact distal pulses.  Exam reveals no gallop and no friction rub.   No murmur heard. Pulmonary/Chest: Effort normal and breath sounds normal.  Abdominal: Soft. Bowel sounds are normal. She exhibits no distension and no mass. There is no tenderness.  Musculoskeletal: Normal range of motion.  Lymphadenopathy:    She has no cervical  adenopathy.  Neurological: She is alert and oriented to person, place, and time. She has normal reflexes.  Skin: Skin is warm and dry.  Psychiatric: She has a normal mood and affect. Her behavior is normal. Judgment and thought content normal.    BP 125/87 mmHg  Pulse 70  Temp(Src) 97.1 F (36.2 C) (Oral)  Ht _0  (1.626 m)  Wt 236 lb (107.049 kg)  BMI 40.49 kg/m2       Assessment & Plan:  1. Essential hypertension No salt added - nebivolol (BYSTOLIC) 10 MG tablet; Take 1 tablet by mouth  every morning  Dispense: 90 tablet; Refill: 0 - CMP14+EGFR  2. Gastroesophageal reflux disease without esophagitis Continue Protonix  3. Obesity (BMI 35.0-39.9 without comorbidity) (HCC) Diet and exercise, weight is trending down  4. Annual physical exam  - CBC with Differential/Platelet - Thyroid Panel With TSH - DG Chest 2 View; Future  5. Insomnia Sleep regimen - zolpidem (AMBIEN) 10 MG tablet; Take 1 tablet (10 mg total) by mouth at bedtime as needed for sleep.  Dispense: 30 tablet; Refill: 1  6. Bilateral low back pain without sciatica Moist Heat - ibuprofen (ADVIL,MOTRIN) 800 MG tablet; TAKE 1 TABLET EVERY 8 HOURS AS NEEDED FOR PAIN  Dispense: 180 tablet; Refill: 0   Continue all meds Labs pending- cholesterol drawn at work- copy  To be scanned into chart Health Maintenance reviewed Diet and exercise encouraged RTO 6 months  Mary-Margaret Hassell Done, FNP

## 2015-07-13 NOTE — Patient Instructions (Signed)
Health Maintenance, Female Adopting a healthy lifestyle and getting preventive care can go a long way to promote health and wellness. Talk with your health care provider about what schedule of regular examinations is right for you. This is a good chance for you to check in with your provider about disease prevention and staying healthy. In between checkups, there are plenty of things you can do on your own. Experts have done a lot of research about which lifestyle changes and preventive measures are most likely to keep you healthy. Ask your health care provider for more information. WEIGHT AND DIET  Eat a healthy diet  Be sure to include plenty of vegetables, fruits, low-fat dairy products, and lean protein.  Do not eat a lot of foods high in solid fats, added sugars, or salt.  Get regular exercise. This is one of the most important things you can do for your health.  Most adults should exercise for at least 150 minutes each week. The exercise should increase your heart rate and make you sweat (moderate-intensity exercise).  Most adults should also do strengthening exercises at least twice a week. This is in addition to the moderate-intensity exercise.  Maintain a healthy weight  Body mass index (BMI) is a measurement that can be used to identify possible weight problems. It estimates body fat based on height and weight. Your health care provider can help determine your BMI and help you achieve or maintain a healthy weight.  For females 20 years of age and older:   A BMI below 18.5 is considered underweight.  A BMI of 18.5 to 24.9 is normal.  A BMI of 25 to 29.9 is considered overweight.  A BMI of 30 and above is considered obese.  Watch levels of cholesterol and blood lipids  You should start having your blood tested for lipids and cholesterol at 44 years of age, then have this test every 5 years.  You may need to have your cholesterol levels checked more often if:  Your lipid  or cholesterol levels are high.  You are older than 44 years of age.  You are at high risk for heart disease.  CANCER SCREENING   Lung Cancer  Lung cancer screening is recommended for adults 55-80 years old who are at high risk for lung cancer because of a history of smoking.  A yearly low-dose CT scan of the lungs is recommended for people who:  Currently smoke.  Have quit within the past 15 years.  Have at least a 30-pack-year history of smoking. A pack year is smoking an average of one pack of cigarettes a day for 1 year.  Yearly screening should continue until it has been 15 years since you quit.  Yearly screening should stop if you develop a health problem that would prevent you from having lung cancer treatment.  Breast Cancer  Practice breast self-awareness. This means understanding how your breasts normally appear and feel.  It also means doing regular breast self-exams. Let your health care provider know about any changes, no matter how small.  If you are in your 20s or 30s, you should have a clinical breast exam (CBE) by a health care provider every 1-3 years as part of a regular health exam.  If you are 40 or older, have a CBE every year. Also consider having a breast X-ray (mammogram) every year.  If you have a family history of breast cancer, talk to your health care provider about genetic screening.  If you   are at high risk for breast cancer, talk to your health care provider about having an MRI and a mammogram every year.  Breast cancer gene (BRCA) assessment is recommended for women who have family members with BRCA-related cancers. BRCA-related cancers include:  Breast.  Ovarian.  Tubal.  Peritoneal cancers.  Results of the assessment will determine the need for genetic counseling and BRCA1 and BRCA2 testing. Cervical Cancer Your health care provider may recommend that you be screened regularly for cancer of the pelvic organs (ovaries, uterus, and  vagina). This screening involves a pelvic examination, including checking for microscopic changes to the surface of your cervix (Pap test). You may be encouraged to have this screening done every 3 years, beginning at age 21.  For women ages 30-65, health care providers may recommend pelvic exams and Pap testing every 3 years, or they may recommend the Pap and pelvic exam, combined with testing for human papilloma virus (HPV), every 5 years. Some types of HPV increase your risk of cervical cancer. Testing for HPV may also be done on women of any age with unclear Pap test results.  Other health care providers may not recommend any screening for nonpregnant women who are considered low risk for pelvic cancer and who do not have symptoms. Ask your health care provider if a screening pelvic exam is right for you.  If you have had past treatment for cervical cancer or a condition that could lead to cancer, you need Pap tests and screening for cancer for at least 20 years after your treatment. If Pap tests have been discontinued, your risk factors (such as having a new sexual partner) need to be reassessed to determine if screening should resume. Some women have medical problems that increase the chance of getting cervical cancer. In these cases, your health care provider may recommend more frequent screening and Pap tests. Colorectal Cancer  This type of cancer can be detected and often prevented.  Routine colorectal cancer screening usually begins at 44 years of age and continues through 44 years of age.  Your health care provider may recommend screening at an earlier age if you have risk factors for colon cancer.  Your health care provider may also recommend using home test kits to check for hidden blood in the stool.  A small camera at the end of a tube can be used to examine your colon directly (sigmoidoscopy or colonoscopy). This is done to check for the earliest forms of colorectal  cancer.  Routine screening usually begins at age 50.  Direct examination of the colon should be repeated every 5-10 years through 44 years of age. However, you may need to be screened more often if early forms of precancerous polyps or small growths are found. Skin Cancer  Check your skin from head to toe regularly.  Tell your health care provider about any new moles or changes in moles, especially if there is a change in a mole's shape or color.  Also tell your health care provider if you have a mole that is larger than the size of a pencil eraser.  Always use sunscreen. Apply sunscreen liberally and repeatedly throughout the day.  Protect yourself by wearing long sleeves, pants, a wide-brimmed hat, and sunglasses whenever you are outside. HEART DISEASE, DIABETES, AND HIGH BLOOD PRESSURE   High blood pressure causes heart disease and increases the risk of stroke. High blood pressure is more likely to develop in:  People who have blood pressure in the high end   of the normal range (130-139/85-89 mm Hg).  People who are overweight or obese.  People who are African American.  If you are 38-23 years of age, have your blood pressure checked every 3-5 years. If you are 61 years of age or older, have your blood pressure checked every year. You should have your blood pressure measured twice--once when you are at a hospital or clinic, and once when you are not at a hospital or clinic. Record the average of the two measurements. To check your blood pressure when you are not at a hospital or clinic, you can use:  An automated blood pressure machine at a pharmacy.  A home blood pressure monitor.  If you are between 45 years and 39 years old, ask your health care provider if you should take aspirin to prevent strokes.  Have regular diabetes screenings. This involves taking a blood sample to check your fasting blood sugar level.  If you are at a normal weight and have a low risk for diabetes,  have this test once every three years after 44 years of age.  If you are overweight and have a high risk for diabetes, consider being tested at a younger age or more often. PREVENTING INFECTION  Hepatitis B  If you have a higher risk for hepatitis B, you should be screened for this virus. You are considered at high risk for hepatitis B if:  You were born in a country where hepatitis B is common. Ask your health care provider which countries are considered high risk.  Your parents were born in a high-risk country, and you have not been immunized against hepatitis B (hepatitis B vaccine).  You have HIV or AIDS.  You use needles to inject street drugs.  You live with someone who has hepatitis B.  You have had sex with someone who has hepatitis B.  You get hemodialysis treatment.  You take certain medicines for conditions, including cancer, organ transplantation, and autoimmune conditions. Hepatitis C  Blood testing is recommended for:  Everyone born from [birth year] through 1965.  Anyone with known risk factors for hepatitis C. Sexually transmitted infections (STIs)  You should be screened for sexually transmitted infections (STIs) including gonorrhea and chlamydia if:  You are sexually active and are younger than 44 years of age.  You are older than 44 years of age and your health care provider tells you that you are at risk for this type of infection.  Your sexual activity has changed since you were last screened and you are at an increased risk for chlamydia or gonorrhea. Ask your health care provider if you are at risk.  If you do not have HIV, but are at risk, it may be recommended that you take a prescription medicine daily to prevent HIV infection. This is called pre-exposure prophylaxis (PrEP). You are considered at risk if:  You are sexually active and do not regularly use condoms or know the HIV status of your partner(s).  You take drugs by injection.  You are sexually  active with a partner who has HIV. Talk with your health care provider about whether you are at high risk of being infected with HIV. If you choose to begin PrEP, you should first be tested for HIV. You should then be tested every 3 months for as long as you are taking PrEP.  PREGNANCY   If you are premenopausal and you may become pregnant, ask your health care provider about preconception counseling.  If you may  become pregnant, take 400 to 800 micrograms (mcg) of folic acid every day.  If you want to prevent pregnancy, talk to your health care provider about birth control (contraception). OSTEOPOROSIS AND MENOPAUSE   Osteoporosis is a disease in which the bones lose minerals and strength with aging. This can result in serious bone fractures. Your risk for osteoporosis can be identified using a bone density scan.  If you are 61 years of age or older, or if you are at risk for osteoporosis and fractures, ask your health care provider if you should be screened.  Ask your health care provider whether you should take a calcium or vitamin D supplement to lower your risk for osteoporosis.  Menopause may have certain physical symptoms and risks.  Hormone replacement therapy may reduce some of these symptoms and risks. Talk to your health care provider about whether hormone replacement therapy is right for you.  HOME CARE INSTRUCTIONS   Schedule regular health, dental, and eye exams.  Stay current with your immunizations.   Do not use any tobacco products including cigarettes, chewing tobacco, or electronic cigarettes.  If you are pregnant, do not drink alcohol.  If you are breastfeeding, limit how much and how often you drink alcohol.  Limit alcohol intake to no more than 1 drink per day for nonpregnant women. One drink equals 12 ounces of beer, 5 ounces of wine, or 1 ounces of hard liquor.  Do not use street drugs.  Do not share needles.  Ask your health care provider for help if  you need support or information about quitting drugs.  Tell your health care provider if you often feel depressed.  Tell your health care provider if you have ever been abused or do not feel safe at home.   This information is not intended to replace advice given to you by your health care provider. Make sure you discuss any questions you have with your health care provider.   Document Released: 03/27/2011 Document Revised: 10/02/2014 Document Reviewed: 08/13/2013 Elsevier Interactive Patient Education [employer].

## 2015-07-13 NOTE — Addendum Note (Signed)
Addended by: Prescott GumLAND, Avis Tirone M on: 07/13/2015 04:10 PM   Modules accepted: Orders, SmartSet

## 2015-07-14 LAB — CBC WITH DIFFERENTIAL/PLATELET
BASOS: 1 %
Basophils Absolute: 0.1 10*3/uL (ref 0.0–0.2)
EOS (ABSOLUTE): 0.1 10*3/uL (ref 0.0–0.4)
Eos: 1 %
HEMOGLOBIN: 11.8 g/dL (ref 11.1–15.9)
Hematocrit: 36.2 % (ref 34.0–46.6)
IMMATURE GRANS (ABS): 0 10*3/uL (ref 0.0–0.1)
Immature Granulocytes: 0 %
LYMPHS ABS: 2.6 10*3/uL (ref 0.7–3.1)
LYMPHS: 22 %
MCH: 28.6 pg (ref 26.6–33.0)
MCHC: 32.6 g/dL (ref 31.5–35.7)
MCV: 88 fL (ref 79–97)
Monocytes Absolute: 0.8 10*3/uL (ref 0.1–0.9)
Monocytes: 7 %
Neutrophils Absolute: 7.9 10*3/uL — ABNORMAL HIGH (ref 1.4–7.0)
Neutrophils: 69 %
Platelets: 300 10*3/uL (ref 150–379)
RBC: 4.12 x10E6/uL (ref 3.77–5.28)
RDW: 14.8 % (ref 12.3–15.4)
WBC: 11.4 10*3/uL — ABNORMAL HIGH (ref 3.4–10.8)

## 2015-07-14 LAB — THYROID PANEL WITH TSH
Free Thyroxine Index: 2.1 (ref 1.2–4.9)
T3 Uptake Ratio: 26 % (ref 24–39)
T4, Total: 8.2 ug/dL (ref 4.5–12.0)
TSH: 0.983 u[IU]/mL (ref 0.450–4.500)

## 2015-07-14 LAB — CMP14+EGFR
A/G RATIO: 1.7 (ref 1.1–2.5)
ALK PHOS: 101 IU/L (ref 39–117)
ALT: 29 IU/L (ref 0–32)
AST: 15 IU/L (ref 0–40)
Albumin: 4.2 g/dL (ref 3.5–5.5)
BUN/Creatinine Ratio: 12 (ref 9–23)
BUN: 8 mg/dL (ref 6–24)
CHLORIDE: 102 mmol/L (ref 97–106)
CO2: 23 mmol/L (ref 18–29)
Calcium: 9.7 mg/dL (ref 8.7–10.2)
Creatinine, Ser: 0.66 mg/dL (ref 0.57–1.00)
GFR calc non Af Amer: 109 mL/min/{1.73_m2} (ref >59)
GFR, EST AFRICAN AMERICAN: 125 mL/min/{1.73_m2} (ref >59)
GLUCOSE: 110 mg/dL — AB (ref 65–99)
Globulin, Total: 2.5 g/dL (ref 1.5–4.5)
POTASSIUM: 4.4 mmol/L (ref 3.5–5.2)
Sodium: 140 mmol/L (ref 136–144)
TOTAL PROTEIN: 6.7 g/dL (ref 6.0–8.5)

## 2015-08-20 ENCOUNTER — Telehealth: Payer: Self-pay | Admitting: Nurse Practitioner

## 2015-08-26 MED ORDER — ALBUTEROL SULFATE HFA 108 (90 BASE) MCG/ACT IN AERS: 2.0000 | INHALATION_SPRAY | Freq: Four times a day (QID) | RESPIRATORY_TRACT | Status: DC | PRN

## 2015-08-26 MED ORDER — PANTOPRAZOLE SODIUM 40 MG PO TBEC: DELAYED_RELEASE_TABLET | ORAL | Status: DC

## 2015-08-26 NOTE — Telephone Encounter (Deleted)
Protonix sent in to pharmacy. Not sure why she needs inhaler. Albuterol is on her med list but hasn't been prescribed since last year. No documented history of asthma.

## 2015-08-26 NOTE — Telephone Encounter (Signed)
Albuterol and Protonix sent to pharmacy. Albuterol has not been filled since last year. Has history of asthma listed but it is not on her problem list.

## 2015-09-16 ENCOUNTER — Encounter: Payer: Self-pay | Admitting: Family Medicine

## 2015-09-16 ENCOUNTER — Ambulatory Visit (INDEPENDENT_AMBULATORY_CARE_PROVIDER_SITE_OTHER): Payer: BLUE CROSS/BLUE SHIELD | Admitting: Family Medicine

## 2015-09-16 VITALS — BP 135/86 | HR 68 | Temp 97.2°F | Ht 64.0 in | Wt 247.6 lb

## 2015-09-16 DIAGNOSIS — J0100 Acute maxillary sinusitis, unspecified: Secondary | ICD-10-CM | POA: Diagnosis not present

## 2015-09-16 MED ORDER — AMOXICILLIN-POT CLAVULANATE 875-125 MG PO TABS: 1.0000 | ORAL_TABLET | Freq: Two times a day (BID) | ORAL | Status: DC

## 2015-09-16 NOTE — Progress Notes (Signed)
   HPI  Patient presents today here for ear pain and sinus issues.  Patient's when she's had about a week of nasal congestion and sinus tenderness.  She had about 2-3 days of bilateral ear pain No muffled hearing She also has intermittent headache described as frontal headache  She also has sneezing.  She's tolerating food and fluids easily  PMH: Smoking status noted ROS: Per HPI  Objective: BP 135/86 mmHg  Pulse 68  Temp(Src) 97.2 F (36.2 C) (Oral)  Ht 5\' 4"  (1.626 m)  Wt 247 lb 9.6 oz (112.311 kg)  BMI 42.48 kg/m2 Gen: NAD, alert, cooperative with exam HEENT: NCAT, TMs normal bilaterally, nares with some swelling, bilateral maxillary sinus tenderness to palpation CV: RRR, good S1/S2, no murmur Resp: CTABL, no wheezes, non-labored Ext: No edema, warm Neuro: Alert and oriented, No gross deficits  Assessment and plan:  # Acute sinusitis Likely developing acute sinusitis with 7 days of symptoms and facial tenderness No signs of ear infection Cover with Augmentin, recommended that she increase Flonase to twice daily and start the Augmentin in 2 days if she has no improvement in symptoms. Supportive Care, return to clinic if worsening or does not improve as expected    Meds ordered this encounter  Medications  . amoxicillin-clavulanate (AUGMENTIN) 875-125 MG tablet    Sig: Take 1 tablet by mouth 2 (two) times daily.    Dispense:  20 tablet    Refill:  0    Murtis SinkSam Bemnet Trovato, MD [facility] 09/16/2015, 3:31 PM

## 2015-09-16 NOTE — Patient Instructions (Signed)
Great to see you!  I hope you enjoy your trip  Take the medicine if you cannot tell you are getting better by Saturday also take it if you develop shortness of breath, fever, malaise or have a sharp worsening of symptoms.

## 2015-09-23 ENCOUNTER — Telehealth: Payer: Self-pay | Admitting: Nurse Practitioner

## 2015-09-23 MED ORDER — FLUCONAZOLE 150 MG PO TABS: 150.0000 mg | ORAL_TABLET | Freq: Once | ORAL | Status: DC

## 2015-09-23 NOTE — Telephone Encounter (Signed)
Diflucan rx sent to pharmacy.

## 2015-10-27 ENCOUNTER — Other Ambulatory Visit: Payer: Self-pay | Admitting: Family Medicine

## 2015-10-27 ENCOUNTER — Other Ambulatory Visit: Payer: Self-pay | Admitting: Nurse Practitioner

## 2015-10-28 NOTE — Telephone Encounter (Signed)
Last seen 09/16/15  Dr Ermalinda Memos  If approved route to nurse to call into [facility]

## 2015-10-31 ENCOUNTER — Other Ambulatory Visit: Payer: Self-pay | Admitting: Nurse Practitioner

## 2016-03-24 DIAGNOSIS — M545 Low back pain: Secondary | ICD-10-CM | POA: Diagnosis not present

## 2016-03-24 DIAGNOSIS — M542 Cervicalgia: Secondary | ICD-10-CM | POA: Diagnosis not present

## 2016-03-24 DIAGNOSIS — M9904 Segmental and somatic dysfunction of sacral region: Secondary | ICD-10-CM | POA: Diagnosis not present

## 2016-03-24 DIAGNOSIS — M546 Pain in thoracic spine: Secondary | ICD-10-CM | POA: Diagnosis not present

## 2016-03-31 DIAGNOSIS — M546 Pain in thoracic spine: Secondary | ICD-10-CM | POA: Diagnosis not present

## 2016-03-31 DIAGNOSIS — M9904 Segmental and somatic dysfunction of sacral region: Secondary | ICD-10-CM | POA: Diagnosis not present

## 2016-03-31 DIAGNOSIS — M542 Cervicalgia: Secondary | ICD-10-CM | POA: Diagnosis not present

## 2016-03-31 DIAGNOSIS — M545 Low back pain: Secondary | ICD-10-CM | POA: Diagnosis not present

## 2016-07-17 ENCOUNTER — Ambulatory Visit (INDEPENDENT_AMBULATORY_CARE_PROVIDER_SITE_OTHER): Payer: BLUE CROSS/BLUE SHIELD | Admitting: Nurse Practitioner

## 2016-07-17 ENCOUNTER — Encounter: Payer: Self-pay | Admitting: Nurse Practitioner

## 2016-07-17 VITALS — BP 172/96 | HR 81 | Temp 98.4°F | Ht 64.0 in | Wt 260.0 lb

## 2016-07-17 DIAGNOSIS — I10 Essential (primary) hypertension: Secondary | ICD-10-CM | POA: Diagnosis not present

## 2016-07-17 DIAGNOSIS — R600 Localized edema: Secondary | ICD-10-CM

## 2016-07-17 DIAGNOSIS — R609 Edema, unspecified: Secondary | ICD-10-CM | POA: Insufficient documentation

## 2016-07-17 DIAGNOSIS — F5101 Primary insomnia: Secondary | ICD-10-CM

## 2016-07-17 DIAGNOSIS — E669 Obesity, unspecified: Secondary | ICD-10-CM

## 2016-07-17 DIAGNOSIS — Z0000 Encounter for general adult medical examination without abnormal findings: Secondary | ICD-10-CM

## 2016-07-17 DIAGNOSIS — Z91010 Allergy to peanuts: Secondary | ICD-10-CM

## 2016-07-17 DIAGNOSIS — K219 Gastro-esophageal reflux disease without esophagitis: Secondary | ICD-10-CM

## 2016-07-17 DIAGNOSIS — F908 Attention-deficit hyperactivity disorder, other type: Secondary | ICD-10-CM

## 2016-07-17 LAB — URINALYSIS, COMPLETE
Bilirubin, UA: NEGATIVE
Glucose, UA: NEGATIVE
Ketones, UA: NEGATIVE
Nitrite, UA: NEGATIVE
PH UA: 7 (ref 5.0–7.5)
Protein, UA: NEGATIVE
RBC, UA: NEGATIVE
Specific Gravity, UA: 1.01 (ref 1.005–1.030)
Urobilinogen, Ur: 0.2 mg/dL (ref 0.2–1.0)

## 2016-07-17 LAB — MICROSCOPIC EXAMINATION
BACTERIA UA: NONE SEEN
RBC, UA: NONE SEEN /hpf (ref 0–?)

## 2016-07-17 MED ORDER — PANTOPRAZOLE SODIUM 40 MG PO TBEC: DELAYED_RELEASE_TABLET | ORAL | 1 refills | Status: DC

## 2016-07-17 MED ORDER — FUROSEMIDE 20 MG PO TABS: 20.0000 mg | ORAL_TABLET | Freq: Every day | ORAL | 3 refills | Status: DC

## 2016-07-17 MED ORDER — FUROSEMIDE 20 MG PO TABS: 20.0000 mg | ORAL_TABLET | Freq: Every day | ORAL | 1 refills | Status: DC

## 2016-07-17 MED ORDER — NEBIVOLOL HCL 10 MG PO TABS: 10.0000 mg | ORAL_TABLET | Freq: Every morning | ORAL | 1 refills | Status: DC

## 2016-07-17 NOTE — Progress Notes (Signed)
Subjective:    Patient ID: Ashley Bradford, female    DOB: 1971-08-31, 45 y.o.   MRN: [id number] Patient here today for CPE,( NO PAP ) and follow up of chronic medical problems. Her last visit was in October of 2016. She is doing well today without complaints. Has been out of blood pressure meds for over a week.  Outpatient Encounter Prescriptions as of 07/17/2016  Medication Sig  . albuterol (PROVENTIL HFA;VENTOLIN HFA) 108 (90 BASE) MCG/ACT inhaler Inhale 2 puffs into the lungs every 6 (six) hours as needed for wheezing or shortness of breath.  . ALBUTEROL IN Inhale into the lungs as needed.  Marland Kitchen BYSTOLIC 10 MG tablet TAKE  ONE TABLET BY MOUTH EVERY MORNING  . cetirizine (ZYRTEC) 10 MG tablet Take 10 mg by mouth daily.  . clobetasol cream (TEMOVATE) 1.54 % Apply 1 application topically as needed (HAND).  Marland Kitchen EPINEPHrine 0.3 mg/0.3 mL IJ SOAJ injection Inject 0.3 mLs (0.3 mg total) into the muscle once.  Marland Kitchen ibuprofen (ADVIL,MOTRIN) 800 MG tablet TAKE ONE TABLET BY MOUTH EVERY 8 HOURS AS NEEDED FOR PAIN  . LORazepam (ATIVAN) 1 MG tablet   . methylphenidate (RITALIN) 20 MG tablet   . pantoprazole (PROTONIX) 40 MG tablet Take 1 tablet by mouth two  times daily  . zolpidem (AMBIEN) 10 MG tablet TAKE ONE TABLET BY MOUTH AT BEDTIME AS NEEDED FOR SLEEP    C/O swelling bil ankles and hands.  Hypertension  This is a chronic problem. The current episode started more than 1 year ago. The problem has been gradually improving since onset. The problem is controlled. Pertinent negatives include no blurred vision, chest pain or headaches. There are no associated agents to hypertension. Risk factors for coronary artery disease include obesity. Past treatments include angiotensin blockers. There are no compliance problems.   GERD Taking Protonix with symptom relief. insomnia Patient on ambien nightly works well without side effects the next day. Does not take every night. Takes 1-2 x a week- has recently been out  of meds. ADHD Is on concerta 00QQ daily and takes ritalin on as needed basis. SH egets meds form psych. Helps her to concentrate.    Review of Systems  Constitutional: Negative.   HENT: Negative.   Eyes: Negative.  Negative for blurred vision.  Respiratory: Negative.   Cardiovascular: Negative.  Negative for chest pain.  Gastrointestinal: Negative.   Endocrine: Negative.   Genitourinary: Negative.   Musculoskeletal: Negative.   Skin: Negative.   Allergic/Immunologic: Negative.   Neurological: Negative.  Negative for headaches.  Hematological: Negative.   Psychiatric/Behavioral: Negative.   All other systems reviewed and are negative.      Objective:   Physical Exam  Constitutional: She is oriented to person, place, and time. She appears well-developed and well-nourished.  HENT:  Nose: Nose normal.  Mouth/Throat: Oropharynx is clear and moist.  Eyes: EOM are normal.  Neck: Trachea normal, normal range of motion and full passive range of motion without pain. Neck supple. No JVD present. Carotid bruit is not present. No thyromegaly present.  Cardiovascular: Normal rate, regular rhythm, normal heart sounds and intact distal pulses.  Exam reveals no gallop and no friction rub.   No murmur heard. Pulmonary/Chest: Effort normal and breath sounds normal.  Abdominal: Soft. Bowel sounds are normal. She exhibits no distension and no mass. There is no tenderness.  Musculoskeletal: Normal range of motion. She exhibits edema (1+ pitton edema bil ankles).  Lymphadenopathy:    She has no  cervical adenopathy.  Neurological: She is alert and oriented to person, place, and time. She has normal reflexes.  Skin: Skin is warm and dry.  Psychiatric: She has a normal mood and affect. Her behavior is normal. Judgment and thought content normal.    BP (!) 172/96 (BP Location: Left Arm, Cuff Size: Large)   Pulse 81   Temp 98.4 F (36.9 C) (Oral)   Ht 5' 4"  (1.626 m)   Wt 260 lb (117.9 kg)    LMP 03/29/2015 (Exact Date)   BMI 44.63 kg/m    Urine negative     Assessment & Plan:  1. Annual physical exam - Urinalysis, Complete - CBC with Differential/Platelet - Thyroid Panel With TSH  2. Essential hypertension Do not add salt to diet - CMP14+EGFR - Lipid panel - nebivolol (BYSTOLIC) 10 MG tablet; Take 1 tablet (10 mg total) by mouth every morning.  Dispense: 90 tablet; Refill: 1  3. Gastroesophageal reflux disease without esophagitis Avoid spicy foods Do not eat 2 hours prior to bedtime - pantoprazole (PROTONIX) 40 MG tablet; Take 1 tablet by mouth two  times daily  Dispense: 180 tablet; Refill: 1  4. Primary insomnia Bedtime routine  5. Obesity (BMI 35.0-39.9 without comorbidity) Discussed diet and exercise for person with BMI >25 Will recheck weight in 3-6 months  6. Peripheral edema Elevate legs when sititng - furosemide (LASIX) 20 MG tablet; Take 1 tablet (20 mg total) by mouth daily.  Dispense: 90 tablet; Refill: 1  7. ADHD, adult residual type continue to get med from psych  8. Peanut allergy Avoid peanuts   Patient to schedule mammogram at [facility] office Labs pending Health maintenance reviewed Diet and exercise encouraged Continue all meds Follow up  In 6 months   [place], FNP

## 2016-07-17 NOTE — Patient Instructions (Signed)
Edema °Edema is an abnormal buildup of fluids in your body tissues. Edema is somewhat dependent on gravity to pull the fluid to the lowest place in your body. That makes the condition more common in the legs and thighs (lower extremities). Painless swelling of the feet and ankles is common and becomes more likely as you get older. It is also common in looser tissues, like around your eyes.  °When the affected area is squeezed, the fluid may move out of that spot and leave a dent for a few moments. This dent is called pitting.  °CAUSES  °There are many possible causes of edema. Eating too much salt and being on your feet or sitting for a long time can cause edema in your legs and ankles. Hot weather may make edema worse. Common medical causes of edema include: °· Heart failure. °· Liver disease. °· Kidney disease. °· Weak blood vessels in your legs. °· Cancer. °· An injury. °· Pregnancy. °· Some medications. °· Obesity.  °SYMPTOMS  °Edema is usually painless. Your skin may look swollen or shiny.  °DIAGNOSIS  °Your health care provider may be able to diagnose edema by asking about your medical history and doing a physical exam. You may need to have tests such as X-rays, an electrocardiogram, or blood tests to check for medical conditions that may cause edema.  °TREATMENT  °Edema treatment depends on the cause. If you have heart, liver, or kidney disease, you need the treatment appropriate for these conditions. General treatment may include: °· Elevation of the affected body part above the level of your heart. °· Compression of the affected body part. Pressure from elastic bandages or support stockings squeezes the tissues and forces fluid back into the blood vessels. This keeps fluid from entering the tissues. °· Restriction of fluid and salt intake. °· Use of a water pill (diuretic). These medications are appropriate only for some types of edema. They pull fluid out of your body and make you urinate more often. This  gets rid of fluid and reduces swelling, but diuretics can have side effects. Only use diuretics as directed by your health care provider. °HOME CARE INSTRUCTIONS  °· Keep the affected body part above the level of your heart when you are lying down.   °· Do not sit still or stand for prolonged periods.   °· Do not put anything directly under your knees when lying down. °· Do not wear constricting clothing or garters on your upper legs.   °· Exercise your legs to work the fluid back into your blood vessels. This may help the swelling go down.   °· Wear elastic bandages or support stockings to reduce ankle swelling as directed by your health care provider.   °· Eat a low-salt diet to reduce fluid if your health care provider recommends it.   °· Only take medicines as directed by your health care provider.  °SEEK MEDICAL CARE IF:  °· Your edema is not responding to treatment. °· You have heart, liver, or kidney disease and notice symptoms of edema. °· You have edema in your legs that does not improve after elevating them.   °· You have sudden and unexplained weight gain. °SEEK IMMEDIATE MEDICAL CARE IF:  °· You develop shortness of breath or chest pain.   °· You cannot breathe when you lie down. °· You develop pain, redness, or warmth in the swollen areas.   °· You have heart, liver, or kidney disease and suddenly get edema. °· You have a fever and your symptoms suddenly get worse. °MAKE SURE YOU:  °·   Understand these instructions. °· Will watch your condition. °· Will get help right away if you are not doing well or get worse. °  °This information is not intended to replace advice given to you by your health care provider. Make sure you discuss any questions you have with your health care provider. °  °Document Released: 09/11/2005 Document Revised: 10/02/2014 Document Reviewed: 07/04/2013 °Elsevier Interactive Patient Education ©2016 [employer] ° °

## 2016-07-18 LAB — LIPID PANEL
CHOL/HDL RATIO: 3 ratio (ref 0.0–4.4)
Cholesterol, Total: 164 mg/dL (ref 100–199)
HDL: 55 mg/dL (ref >39)
LDL CALC: 91 mg/dL (ref 0–99)
Triglycerides: 90 mg/dL (ref 0–149)
VLDL Cholesterol Cal: 18 mg/dL (ref 5–40)

## 2016-07-18 LAB — THYROID PANEL WITH TSH
Free Thyroxine Index: 1.6 (ref 1.2–4.9)
T3 Uptake Ratio: 24 % (ref 24–39)
T4 TOTAL: 6.8 ug/dL (ref 4.5–12.0)
TSH: 1.03 u[IU]/mL (ref 0.450–4.500)

## 2016-07-18 LAB — CBC WITH DIFFERENTIAL/PLATELET
BASOS ABS: 0 10*3/uL (ref 0.0–0.2)
Basos: 0 %
EOS (ABSOLUTE): 0.2 10*3/uL (ref 0.0–0.4)
Eos: 2 %
HEMOGLOBIN: 10 g/dL — AB (ref 11.1–15.9)
Hematocrit: 31.9 % — ABNORMAL LOW (ref 34.0–46.6)
Immature Grans (Abs): 0 10*3/uL (ref 0.0–0.1)
Immature Granulocytes: 0 %
LYMPHS ABS: 1.9 10*3/uL (ref 0.7–3.1)
Lymphs: 20 %
MCH: 24.7 pg — AB (ref 26.6–33.0)
MCHC: 31.3 g/dL — AB (ref 31.5–35.7)
MCV: 79 fL (ref 79–97)
MONOCYTES: 6 %
Monocytes Absolute: 0.6 10*3/uL (ref 0.1–0.9)
NEUTROS ABS: 7.1 10*3/uL — AB (ref 1.4–7.0)
Neutrophils: 72 %
PLATELETS: 283 10*3/uL (ref 150–379)
RBC: 4.05 x10E6/uL (ref 3.77–5.28)
RDW: 16.5 % — AB (ref 12.3–15.4)
WBC: 9.8 10*3/uL (ref 3.4–10.8)

## 2016-07-18 LAB — CMP14+EGFR
A/G RATIO: 1.6 (ref 1.2–2.2)
ALT: 21 IU/L (ref 0–32)
AST: 15 IU/L (ref 0–40)
Albumin: 4.2 g/dL (ref 3.5–5.5)
Alkaline Phosphatase: 89 IU/L (ref 39–117)
BUN/Creatinine Ratio: 13 (ref 9–23)
BUN: 11 mg/dL (ref 6–24)
Bilirubin Total: 0.3 mg/dL (ref 0.0–1.2)
CALCIUM: 9.3 mg/dL (ref 8.7–10.2)
CO2: 23 mmol/L (ref 18–29)
CREATININE: 0.85 mg/dL (ref 0.57–1.00)
Chloride: 105 mmol/L (ref 96–106)
GFR, EST AFRICAN AMERICAN: 96 mL/min/{1.73_m2} (ref >59)
GFR, EST NON AFRICAN AMERICAN: 84 mL/min/{1.73_m2} (ref >59)
GLOBULIN, TOTAL: 2.6 g/dL (ref 1.5–4.5)
Glucose: 86 mg/dL (ref 65–99)
POTASSIUM: 4.4 mmol/L (ref 3.5–5.2)
SODIUM: 142 mmol/L (ref 134–144)
TOTAL PROTEIN: 6.8 g/dL (ref 6.0–8.5)

## 2016-08-01 ENCOUNTER — Encounter: Payer: Self-pay | Admitting: *Deleted

## 2016-08-31 DIAGNOSIS — N764 Abscess of vulva: Secondary | ICD-10-CM | POA: Diagnosis not present

## 2016-10-05 DIAGNOSIS — Z6841 Body Mass Index (BMI) 40.0 and over, adult: Secondary | ICD-10-CM | POA: Diagnosis not present

## 2016-10-05 DIAGNOSIS — Z130 Encounter for screening for diseases of the blood and blood-forming organs and certain disorders involving the immune mechanism: Secondary | ICD-10-CM | POA: Diagnosis not present

## 2016-10-05 DIAGNOSIS — N920 Excessive and frequent menstruation with regular cycle: Secondary | ICD-10-CM | POA: Diagnosis not present

## 2016-10-05 DIAGNOSIS — Z1231 Encounter for screening mammogram for malignant neoplasm of breast: Secondary | ICD-10-CM | POA: Diagnosis not present

## 2016-10-05 DIAGNOSIS — Z1389 Encounter for screening for other disorder: Secondary | ICD-10-CM | POA: Diagnosis not present

## 2016-10-05 DIAGNOSIS — Z01419 Encounter for gynecological examination (general) (routine) without abnormal findings: Secondary | ICD-10-CM | POA: Diagnosis not present

## 2016-10-05 DIAGNOSIS — Z113 Encounter for screening for infections with a predominantly sexual mode of transmission: Secondary | ICD-10-CM | POA: Diagnosis not present

## 2016-10-05 LAB — HM MAMMOGRAPHY

## 2016-10-25 DIAGNOSIS — D251 Intramural leiomyoma of uterus: Secondary | ICD-10-CM | POA: Diagnosis not present

## 2016-10-25 DIAGNOSIS — N920 Excessive and frequent menstruation with regular cycle: Secondary | ICD-10-CM | POA: Diagnosis not present

## 2016-10-25 DIAGNOSIS — N83202 Unspecified ovarian cyst, left side: Secondary | ICD-10-CM | POA: Diagnosis not present

## 2016-11-20 ENCOUNTER — Telehealth: Payer: Self-pay | Admitting: Nurse Practitioner

## 2016-11-24 ENCOUNTER — Encounter: Payer: Self-pay | Admitting: Family

## 2016-11-24 ENCOUNTER — Telehealth: Payer: Self-pay | Admitting: Nurse Practitioner

## 2016-11-24 ENCOUNTER — Other Ambulatory Visit: Payer: Self-pay | Admitting: *Deleted

## 2016-11-24 ENCOUNTER — Ambulatory Visit (INDEPENDENT_AMBULATORY_CARE_PROVIDER_SITE_OTHER): Payer: BLUE CROSS/BLUE SHIELD | Admitting: Family

## 2016-11-24 VITALS — BP 144/85 | HR 68 | Temp 97.5°F | Ht 64.0 in | Wt 255.8 lb

## 2016-11-24 DIAGNOSIS — J209 Acute bronchitis, unspecified: Secondary | ICD-10-CM

## 2016-11-24 MED ORDER — AZITHROMYCIN 250 MG PO TABS: ORAL_TABLET | ORAL | 0 refills | Status: DC

## 2016-11-24 MED ORDER — BENZONATATE 200 MG PO CAPS
200.0000 mg | ORAL_CAPSULE | Freq: Three times a day (TID) | ORAL | 1 refills | Status: DC | PRN
Start: 2016-11-24 — End: 2016-11-25

## 2016-11-24 MED ORDER — BENZONATATE 200 MG PO CAPS: 200.0000 mg | ORAL_CAPSULE | Freq: Three times a day (TID) | ORAL | 1 refills | Status: DC | PRN

## 2016-11-24 NOTE — Telephone Encounter (Signed)
Prescription sent to pharmacy.

## 2016-11-24 NOTE — Addendum Note (Signed)
Addended by: Jannifer RodneyHAWKS, Nevae Pinnix A on: 11/24/2016 03:04 PM   Modules accepted: Orders

## 2016-11-24 NOTE — Patient Instructions (Signed)

## 2016-11-24 NOTE — Progress Notes (Signed)
Per [facility] they did not receive RX for [employer] [facility] resent Okayed per [employer] Hawks

## 2016-11-24 NOTE — Progress Notes (Signed)
   Subjective:    Patient ID: Ashley Bradford, female    DOB: 06/01/1971, 46 y.o.   MRN: [id number]  Cough  This is a new problem. The current episode started in the past 7 days. The problem has been gradually worsening. The problem occurs every few minutes. The cough is productive of sputum. Associated symptoms include ear congestion, ear pain, headaches, nasal congestion, postnasal drip, rhinorrhea, a sore throat and shortness of breath. Pertinent negatives include no chills, fever, myalgias or wheezing. The symptoms are aggravated by lying down. She has tried rest and OTC cough suppressant for the symptoms. The treatment provided mild relief. Her past medical history is significant for asthma.      Review of Systems  Constitutional: Negative for chills and fever.  HENT: Positive for ear pain, postnasal drip, rhinorrhea and sore throat.   Respiratory: Positive for cough and shortness of breath. Negative for wheezing.   Musculoskeletal: Negative for myalgias.  Neurological: Positive for headaches.  All other systems reviewed and are negative.      Objective:   Physical Exam  Constitutional: She is oriented to person, place, and time. She appears well-developed and well-nourished. No distress.  HENT:  Head: Normocephalic and atraumatic.  Right Ear: External ear normal.  Left Ear: External ear normal.  Nose: Mucosal edema and rhinorrhea present.  Mouth/Throat: Posterior oropharyngeal erythema present.  Eyes: Pupils are equal, round, and reactive to light.  Neck: Normal range of motion. Neck supple. No thyromegaly present.  Cardiovascular: Normal rate, regular rhythm, normal heart sounds and intact distal pulses.   No murmur heard. Pulmonary/Chest: Effort normal and breath sounds normal. No respiratory distress. She has no wheezes.  nonproductive cough  Abdominal: Soft. Bowel sounds are normal. She exhibits no distension. There is no tenderness.  Musculoskeletal: Normal range of  motion. She exhibits no edema or tenderness.  Neurological: She is alert and oriented to person, place, and time.  Skin: Skin is warm and dry.  Psychiatric: She has a normal mood and affect. Her behavior is normal. Judgment and thought content normal.  Vitals reviewed.     BP (!) 144/85   Pulse 68   Temp 97.5 F (36.4 C) (Oral)   Ht 5\' 4"  (1.626 m)   Wt 255 lb 12.8 oz (116 kg)   BMI 43.91 kg/m      Assessment & Plan:  1. Acute bronchitis, unspecified organism - Take meds as prescribed - Use a cool mist humidifier  -Use saline nose sprays frequently -Saline irrigations of the nose can be very helpful if done frequently.  * 4X daily for 1 week*  * Use of a nettie pot can be helpful with this. Follow directions with this* -Force fluids -For any cough or congestion  Use plain Mucinex- regular strength or max strength is fine   * Children- consult with Pharmacist for dosing -For fever or aces or pains- take tylenol or ibuprofen appropriate for age and weight.  * for fevers greater than 101 orally you may alternate ibuprofen and tylenol every  3 hours. -Throat lozenges if help -New toothbrush in 3 days - azithromycin (ZITHROMAX) 250 MG tablet; Take 500 mg once, then 250 mg for four days  Dispense: 6 tablet; Refill: 0  Jannifer Rodneyhristy Sholom Dulude, FNP

## 2016-11-24 NOTE — Telephone Encounter (Signed)
VM full unable to lave mssg

## 2016-11-25 ENCOUNTER — Other Ambulatory Visit: Payer: Self-pay | Admitting: *Deleted

## 2016-11-25 MED ORDER — BENZONATATE 200 MG PO CAPS: 200.0000 mg | ORAL_CAPSULE | Freq: Three times a day (TID) | ORAL | 1 refills | Status: DC | PRN

## 2017-01-05 DIAGNOSIS — I10 Essential (primary) hypertension: Secondary | ICD-10-CM | POA: Diagnosis not present

## 2017-01-05 DIAGNOSIS — Z6841 Body Mass Index (BMI) 40.0 and over, adult: Secondary | ICD-10-CM | POA: Diagnosis not present

## 2017-01-09 ENCOUNTER — Other Ambulatory Visit: Payer: Self-pay | Admitting: Nurse Practitioner

## 2017-01-09 DIAGNOSIS — K219 Gastro-esophageal reflux disease without esophagitis: Secondary | ICD-10-CM

## 2017-01-09 DIAGNOSIS — N920 Excessive and frequent menstruation with regular cycle: Secondary | ICD-10-CM | POA: Diagnosis not present

## 2017-01-09 DIAGNOSIS — R609 Edema, unspecified: Secondary | ICD-10-CM

## 2017-01-09 DIAGNOSIS — I10 Essential (primary) hypertension: Secondary | ICD-10-CM

## 2017-02-05 DIAGNOSIS — Z6841 Body Mass Index (BMI) 40.0 and over, adult: Secondary | ICD-10-CM | POA: Diagnosis not present

## 2017-02-05 DIAGNOSIS — Z713 Dietary counseling and surveillance: Secondary | ICD-10-CM | POA: Diagnosis not present

## 2017-02-16 ENCOUNTER — Other Ambulatory Visit: Payer: Self-pay | Admitting: *Deleted

## 2017-02-16 MED ORDER — ZOLPIDEM TARTRATE 10 MG PO TABS: 10.0000 mg | ORAL_TABLET | Freq: Every evening | ORAL | 0 refills | Status: DC | PRN

## 2017-02-16 NOTE — Telephone Encounter (Signed)
Phoned in.

## 2017-02-16 NOTE — Telephone Encounter (Signed)
Please call in ambien with 0 refills Last refill without being seen  

## 2017-02-16 NOTE — Telephone Encounter (Signed)
[facility]

## 2017-02-20 ENCOUNTER — Ambulatory Visit (INDEPENDENT_AMBULATORY_CARE_PROVIDER_SITE_OTHER): Payer: BLUE CROSS/BLUE SHIELD | Admitting: Family

## 2017-02-20 ENCOUNTER — Encounter: Payer: Self-pay | Admitting: Family

## 2017-02-20 VITALS — BP 131/67 | HR 67 | Temp 96.8°F | Ht 64.0 in | Wt 264.0 lb

## 2017-02-20 DIAGNOSIS — R300 Dysuria: Secondary | ICD-10-CM | POA: Diagnosis not present

## 2017-02-20 DIAGNOSIS — N3001 Acute cystitis with hematuria: Secondary | ICD-10-CM

## 2017-02-20 LAB — URINALYSIS, COMPLETE
Bilirubin, UA: NEGATIVE
Glucose, UA: NEGATIVE
KETONES UA: NEGATIVE
NITRITE UA: NEGATIVE
Protein, UA: NEGATIVE
SPEC GRAV UA: 1.01 (ref 1.005–1.030)
Urobilinogen, Ur: 0.2 mg/dL (ref 0.2–1.0)
pH, UA: 6 (ref 5.0–7.5)

## 2017-02-20 LAB — MICROSCOPIC EXAMINATION: RENAL EPITHEL UA: NONE SEEN /HPF

## 2017-02-20 MED ORDER — METHYLPHENIDATE HCL ER (OSM) 36 MG PO TBCR: 36.0000 mg | EXTENDED_RELEASE_TABLET | Freq: Every day | ORAL | 0 refills | Status: DC

## 2017-02-20 MED ORDER — NITROFURANTOIN MONOHYD MACRO 100 MG PO CAPS: 100.0000 mg | ORAL_CAPSULE | Freq: Two times a day (BID) | ORAL | 0 refills | Status: DC

## 2017-02-20 NOTE — Progress Notes (Signed)
   Subjective:    Patient ID: Ashley Bradford, female    DOB: 09/14/1971, 46 y.o.   MRN: [id number]  Urinary Tract Infection   This is a new problem. The current episode started in the past 7 days. The problem occurs every urination. The problem has been gradually worsening. The quality of the pain is described as aching. The pain is at a severity of 2/10. The pain is mild. Associated symptoms include frequency, hematuria and urgency. Pertinent negatives include no flank pain, hesitancy, nausea or vomiting. She has tried increased fluids for the symptoms. The treatment provided mild relief.      Review of Systems  Gastrointestinal: Negative for nausea and vomiting.  Genitourinary: Positive for frequency, hematuria and urgency. Negative for flank pain and hesitancy.  All other systems reviewed and are negative.      Objective:   Physical Exam  Constitutional: She is oriented to person, place, and time. She appears well-developed and well-nourished. No distress.  HENT:  Head: Normocephalic.  Eyes: Pupils are equal, round, and reactive to light.  Neck: Normal range of motion. Neck supple. No thyromegaly present.  Cardiovascular: Normal rate, regular rhythm, normal heart sounds and intact distal pulses.   No murmur heard. Pulmonary/Chest: Effort normal and breath sounds normal. No respiratory distress. She has no wheezes.  Abdominal: Soft. Bowel sounds are normal. She exhibits no distension. There is no tenderness.  Musculoskeletal: Normal range of motion. She exhibits no edema or tenderness.  Neurological: She is alert and oriented to person, place, and time.  Skin: Skin is warm and dry.  Psychiatric: She has a normal mood and affect. Her behavior is normal. Judgment and thought content normal.  Vitals reviewed.     BP 131/67 (BP Location: Left Arm, Patient Position: Sitting, Cuff Size: Large)   Pulse 67   Temp (!) 96.8 F (36 C) (Oral)   Ht 5\' 4"  (1.626 m)   Wt 264 lb (119.7 kg)    LMP 02/17/2017 (Approximate)   BMI 45.32 kg/m      Assessment & Plan:  1. Dysuria - Urine culture - Urinalysis, Complete  2. Acute cystitis with hematuria Force fluids AZO over the counter X2 days RTO prn Culture pending - nitrofurantoin, macrocrystal-monohydrate, (MACROBID) 100 MG capsule; Take 1 capsule (100 mg total) by mouth 2 (two) times daily.  Dispense: 10 capsule; Refill: 0  Jannifer Rodneyhristy Daxson Reffett, FNP

## 2017-02-20 NOTE — Patient Instructions (Signed)

## 2017-02-22 LAB — URINE CULTURE

## 2017-03-23 ENCOUNTER — Encounter: Payer: Self-pay | Admitting: Family

## 2017-03-23 ENCOUNTER — Ambulatory Visit (INDEPENDENT_AMBULATORY_CARE_PROVIDER_SITE_OTHER): Payer: BLUE CROSS/BLUE SHIELD | Admitting: Family

## 2017-03-23 VITALS — Temp 98.7°F | Ht 64.0 in | Wt 254.0 lb

## 2017-03-23 DIAGNOSIS — F908 Attention-deficit hyperactivity disorder, other type: Secondary | ICD-10-CM | POA: Diagnosis not present

## 2017-03-23 DIAGNOSIS — H938X1 Other specified disorders of right ear: Secondary | ICD-10-CM

## 2017-03-23 DIAGNOSIS — N3001 Acute cystitis with hematuria: Secondary | ICD-10-CM | POA: Diagnosis not present

## 2017-03-23 DIAGNOSIS — R350 Frequency of micturition: Secondary | ICD-10-CM

## 2017-03-23 LAB — MICROSCOPIC EXAMINATION: Renal Epithel, UA: NONE SEEN /hpf

## 2017-03-23 LAB — URINALYSIS, COMPLETE
BILIRUBIN UA: NEGATIVE
Glucose, UA: NEGATIVE
Ketones, UA: NEGATIVE
Nitrite, UA: POSITIVE — AB
PH UA: 6 (ref 5.0–7.5)
Protein, UA: NEGATIVE
Specific Gravity, UA: 1.025 (ref 1.005–1.030)
Urobilinogen, Ur: 0.2 mg/dL (ref 0.2–1.0)

## 2017-03-23 MED ORDER — AMOXICILLIN-POT CLAVULANATE 875-125 MG PO TABS: 1.0000 | ORAL_TABLET | Freq: Two times a day (BID) | ORAL | 0 refills | Status: DC

## 2017-03-23 MED ORDER — METHYLPHENIDATE HCL ER (OSM) 36 MG PO TBCR: 36.0000 mg | EXTENDED_RELEASE_TABLET | Freq: Every day | ORAL | 0 refills | Status: DC

## 2017-03-23 NOTE — Progress Notes (Signed)
   Subjective:    Patient ID: Ashley Bradford, female    DOB: 01/21/1971, 46 y.o.   MRN: [id number]  HPI Pt presents to the office today for ADHD follow up. Pt currently taking Methylphenidate 36 mg daily. Has a rx of Ritalin 20 mg she can take as needed. States she only uses this if she has "Presentation or something to do in the evening".   Pt states these are working well at this time and denies any adverse effects. No changes in her weight.   Pt would also like to recheck her urine today. Pt was diagnosed with UTI on 02/20/17. States she does have urinary frequency. She is complaining of right ear fullness that has been going on for several weeks. Pt states she feels like it has improved slightly.    Review of Systems  All other systems reviewed and are negative.      Objective:   Physical Exam  Constitutional: She is oriented to person, place, and time. She appears well-developed and well-nourished. No distress.  HENT:  Head: Normocephalic and atraumatic.  Right Ear: There is swelling. Tympanic membrane is erythematous.  Left Ear: There is swelling.  Nose: Nose normal.  Mouth/Throat: Oropharynx is clear and moist.  Eyes: Pupils are equal, round, and reactive to light.  Neck: Normal range of motion. Neck supple. No thyromegaly present.  Cardiovascular: Normal rate, regular rhythm, normal heart sounds and intact distal pulses.   No murmur heard. Pulmonary/Chest: Effort normal and breath sounds normal. No respiratory distress. She has no wheezes.  Abdominal: Soft. Bowel sounds are normal. She exhibits no distension. There is no tenderness.  Musculoskeletal: Normal range of motion. She exhibits no edema or tenderness.  Neurological: She is alert and oriented to person, place, and time.  Skin: Skin is warm and dry.  Psychiatric: She has a normal mood and affect. Her behavior is normal. Judgment and thought content normal.  Vitals reviewed.   Temp 98.7 F (37.1 C) (Oral)   Ht 5'  4" (1.626 m)   Wt 254 lb (115.2 kg)   LMP 03/19/2017   BMI 43.60 kg/m        Assessment & Plan:  1. Urinary frequency - Urinalysis, Complete - amoxicillin-clavulanate (AUGMENTIN) 875-125 MG tablet; Take 1 tablet by mouth 2 (two) times daily.  Dispense: 14 tablet; Refill: 0  2. ADHD, adult residual type Meds as prescribed Behavior modification as needed Follow-up for recheck in 3 months - methylphenidate 36 MG PO CR tablet; Take 1 tablet (36 mg total) by mouth daily.  Dispense: 30 tablet; Refill: 0 - methylphenidate 36 MG PO CR tablet; Take 1 tablet (36 mg total) by mouth daily.  Dispense: 30 tablet; Refill: 0 - methylphenidate 36 MG PO CR tablet; Take 1 tablet (36 mg total) by mouth daily.  Dispense: 30 tablet; Refill: 0  3. Sensation of fullness in right ear - amoxicillin-clavulanate (AUGMENTIN) 875-125 MG tablet; Take 1 tablet by mouth 2 (two) times daily.  Dispense: 14 tablet; Refill: 0  4. Acute cystitis with hematuria Force fluids AZO over the counter X2 days RTO prn Culture pending - Urine Culture   Jannifer Rodneyhristy Hawks, FNP

## 2017-03-23 NOTE — Patient Instructions (Signed)

## 2017-03-25 LAB — URINE CULTURE

## 2017-05-08 ENCOUNTER — Telehealth: Payer: Self-pay | Admitting: Family

## 2017-05-08 NOTE — Telephone Encounter (Signed)
Pt was given 3 months rx of  Concerta on 03/23/17

## 2017-05-08 NOTE — Telephone Encounter (Signed)
No answer, voicemail is full jkp 8/14

## 2017-05-14 NOTE — Telephone Encounter (Signed)
Left message for patient to call but no response. Message will be closed.

## 2017-07-06 ENCOUNTER — Other Ambulatory Visit: Payer: Self-pay | Admitting: *Deleted

## 2017-07-06 ENCOUNTER — Telehealth: Payer: Self-pay | Admitting: Family

## 2017-07-06 MED ORDER — ALBUTEROL SULFATE HFA 108 (90 BASE) MCG/ACT IN AERS: 2.0000 | INHALATION_SPRAY | Freq: Four times a day (QID) | RESPIRATORY_TRACT | 1 refills | Status: DC | PRN

## 2017-07-06 NOTE — Telephone Encounter (Signed)
Script [facility]

## 2017-07-20 ENCOUNTER — Ambulatory Visit (INDEPENDENT_AMBULATORY_CARE_PROVIDER_SITE_OTHER): Payer: BLUE CROSS/BLUE SHIELD | Admitting: Family

## 2017-07-20 ENCOUNTER — Encounter: Payer: Self-pay | Admitting: Family

## 2017-07-20 VITALS — BP 125/73 | HR 64 | Temp 97.0°F | Ht 64.0 in | Wt 254.0 lb

## 2017-07-20 DIAGNOSIS — K219 Gastro-esophageal reflux disease without esophagitis: Secondary | ICD-10-CM

## 2017-07-20 DIAGNOSIS — R609 Edema, unspecified: Secondary | ICD-10-CM

## 2017-07-20 DIAGNOSIS — Z0000 Encounter for general adult medical examination without abnormal findings: Secondary | ICD-10-CM | POA: Diagnosis not present

## 2017-07-20 DIAGNOSIS — J45909 Unspecified asthma, uncomplicated: Secondary | ICD-10-CM | POA: Insufficient documentation

## 2017-07-20 DIAGNOSIS — Z713 Dietary counseling and surveillance: Secondary | ICD-10-CM

## 2017-07-20 DIAGNOSIS — J4520 Mild intermittent asthma, uncomplicated: Secondary | ICD-10-CM

## 2017-07-20 DIAGNOSIS — F5101 Primary insomnia: Secondary | ICD-10-CM | POA: Diagnosis not present

## 2017-07-20 DIAGNOSIS — F908 Attention-deficit hyperactivity disorder, other type: Secondary | ICD-10-CM

## 2017-07-20 DIAGNOSIS — I10 Essential (primary) hypertension: Secondary | ICD-10-CM | POA: Diagnosis not present

## 2017-07-20 MED ORDER — METHYLPHENIDATE HCL ER (OSM) 36 MG PO TBCR: 36.0000 mg | EXTENDED_RELEASE_TABLET | Freq: Every day | ORAL | 0 refills | Status: DC

## 2017-07-20 MED ORDER — LORCASERIN HCL ER 20 MG PO TB24: 20.0000 mg | ORAL_TABLET | Freq: Every day | ORAL | 2 refills | Status: DC

## 2017-07-20 MED ORDER — METHYLPHENIDATE HCL 20 MG PO TABS: 20.0000 mg | ORAL_TABLET | Freq: Every day | ORAL | 0 refills | Status: DC | PRN

## 2017-07-20 NOTE — Progress Notes (Signed)
Subjective:    Patient ID: Ashley Bradford, female    DOB: 1970/11/02, 46 y.o.   MRN: [id number]  Pt presents to office today for CPE without pap. Pt was going to [facility] and was prescribed Belviq and had lost 20 lbs. Pt states she had to follow up monthly and could not keep her appts because of work. Pt requesting refill today.  Hypertension  This is a chronic problem. The current episode started more than 1 year ago. The problem has been resolved since onset. The problem is controlled. Associated symptoms include anxiety and peripheral edema ("comes and goes"). Pertinent negatives include no headaches, malaise/fatigue or shortness of breath. Risk factors for coronary artery disease include dyslipidemia, obesity and sedentary lifestyle. The current treatment provides moderate improvement. There is no history of kidney disease, CAD/MI, CVA or heart failure.  Insomnia  Primary symptoms: difficulty falling asleep, frequent awakening, no malaise/fatigue.  The current episode started more than one year. The onset quality is gradual. The problem is unchanged. The treatment provided moderate relief.  Anxiety  Presents for follow-up visit. Symptoms include excessive worry, insomnia and nervous/anxious behavior. Patient reports no depressed mood or shortness of breath. Symptoms occur occasionally. The quality of sleep is good.   Her past medical history is significant for asthma.  Gastroesophageal Reflux  She complains of belching and heartburn. She reports no coughing, no sore throat or no wheezing. This is a chronic problem. The problem occurs occasionally. The symptoms are aggravated by lying down. Risk factors include obesity. She has tried a PPI for the symptoms. The treatment provided moderate relief.  Asthma  She complains of frequent throat clearing. There is no cough, difficulty breathing, shortness of breath or wheezing. This is a chronic problem. The current episode started more than 1  year ago. The problem occurs rarely. The problem has been waxing and waning. Associated symptoms include heartburn and rhinorrhea. Pertinent negatives include no dyspnea on exertion, ear congestion, ear pain, headaches, malaise/fatigue, sneezing or sore throat. She reports moderate improvement on treatment. Her past medical history is significant for asthma.  Peripheral Edema Pt taking Lasix as needed. Stable ADHD Pt taking Concerta 36 mg daily. Helps to stay focused at work and completing tasks. She has a Ritalin 20 mg that she takes as needed for a test. States this prescription of Ritalin will last her a year.   Review of Systems  Constitutional: Negative for malaise/fatigue.  HENT: Positive for rhinorrhea. Negative for ear pain, sneezing and sore throat.   Respiratory: Negative for cough, shortness of breath and wheezing.   Cardiovascular: Negative for dyspnea on exertion.  Gastrointestinal: Positive for heartburn.  Neurological: Negative for headaches.  Psychiatric/Behavioral: The patient is nervous/anxious and has insomnia.   All other systems reviewed and are negative.      Objective:   Physical Exam  Constitutional: She is oriented to person, place, and time. She appears well-developed and well-nourished. No distress.  Morbid obese  HENT:  Head: Normocephalic and atraumatic.  Right Ear: External ear normal.  Left Ear: External ear normal.  Nose: Nose normal.  Mouth/Throat: Oropharynx is clear and moist.  Eyes: Pupils are equal, round, and reactive to light.  Neck: Normal range of motion. Neck supple. No thyromegaly present.  Cardiovascular: Normal rate, regular rhythm, normal heart sounds and intact distal pulses.   No murmur heard. Pulmonary/Chest: Effort normal and breath sounds normal. No respiratory distress. She has no wheezes.  Abdominal: Soft. Bowel sounds are normal. She  exhibits no distension. There is no tenderness.  Musculoskeletal: Normal range of motion. She  exhibits no edema or tenderness.  Neurological: She is alert and oriented to person, place, and time. She has normal reflexes. No cranial nerve deficit.  Skin: Skin is warm and dry.  Psychiatric: She has a normal mood and affect. Her behavior is normal. Judgment and thought content normal.  Vitals reviewed.     BP 125/73   Pulse 64   Temp (!) 97 F (36.1 C) (Oral)   Ht 5' 4"  (1.626 m)   Wt 254 lb (115.2 kg)   BMI 43.60 kg/m      Assessment & Plan:  1. Essential hypertension - CMP14+EGFR  2. Gastroesophageal reflux disease without esophagitis - CMP14+EGFR  3. Primary insomnia - CMP14+EGFR  4. Peripheral edem - CMP14+EGFR  5. ADHD, adult residual type Meds as prescribed Behavior modification as needed Follow-up for recheck in 3 months - CMP14+EGFR - methylphenidate (RITALIN) 20 MG tablet; Take 1 tablet (20 mg total) by mouth daily as needed.  Dispense: 20 tablet; Refill: 0 - methylphenidate 36 MG PO CR tablet; Take 1 tablet (36 mg total) by mouth daily.  Dispense: 30 tablet; Refill: 0 - methylphenidate 36 MG PO CR tablet; Take 1 tablet (36 mg total) by mouth daily.  Dispense: 30 tablet; Refill: 0 - methylphenidate 36 MG PO CR tablet; Take 1 tablet (36 mg total) by mouth daily.  Dispense: 30 tablet; Refill: 0  6. Mild intermittent asthma without complication - PJK93+OIZT  7. Annual physical exam - Anemia Profile B - CMP14+EGFR - Lipid panel - TSH - VITAMIN D 25 Hydroxy (Vit-D Deficiency, Fractures)  8. Weight loss counseling, encounter for - Lorcaserin HCl ER (BELVIQ XR) 20 MG TB24; Take 20 mg by mouth daily.  Dispense: 30 tablet; Refill: 2  9. Morbid obesity (HCC) - Lorcaserin HCl ER (BELVIQ XR) 20 MG TB24; Take 20 mg by mouth daily.  Dispense: 30 tablet; Refill: 2   Continue all meds Labs pending Health Maintenance reviewed Diet and exercise encouraged RTO 3 months  Evelina Dun, FNP

## 2017-07-20 NOTE — Patient Instructions (Signed)
Exercising to Lose Weight Exercising can help you to lose weight. In order to lose weight through exercise, you need to do vigorous-intensity exercise. You can tell that you are exercising with vigorous intensity if you are breathing very hard and fast and cannot hold a conversation while exercising. Moderate-intensity exercise helps to maintain your current weight. You can tell that you are exercising at a moderate level if you have a higher heart rate and faster breathing, but you are still able to hold a conversation. How often should I exercise? Choose an activity that you enjoy and set realistic goals. Your health care provider can help you to make an activity plan that works for you. Exercise regularly as directed by your health care provider. This may include:  Doing resistance training twice each week, such as: ? Push-ups. ? Sit-ups. ? Lifting weights. ? Using resistance bands.  Doing a given intensity of exercise for a given amount of time. Choose from these options: ? 150 minutes of moderate-intensity exercise every week. ? 75 minutes of vigorous-intensity exercise every week. ? A mix of moderate-intensity and vigorous-intensity exercise every week.  Children, pregnant women, people who are out of shape, people who are overweight, and older adults may need to consult a health care provider for individual recommendations. If you have any sort of medical condition, be sure to consult your health care provider before starting a new exercise program. What are some activities that can help me to lose weight?  Walking at a rate of at least 4.5 miles an hour.  Jogging or running at a rate of 5 miles per hour.  Biking at a rate of at least 10 miles per hour.  Lap swimming.  Roller-skating or in-line skating.  Cross-country skiing.  Vigorous competitive sports, such as football, basketball, and soccer.  Jumping rope.  Aerobic dancing. How can I be more active in my day-to-day  activities?  Use the stairs instead of the elevator.  Take a walk during your lunch break.  If you drive, park your car farther away from work or school.  If you take public transportation, get off one stop early and walk the rest of the way.  Make all of your phone calls while standing up and walking around.  Get up, stretch, and walk around every 30 minutes throughout the day. What guidelines should I follow while exercising?  Do not exercise so much that you hurt yourself, feel dizzy, or get very short of breath.  Consult your health care provider prior to starting a new exercise program.  Wear comfortable clothes and shoes with good support.  Drink plenty of water while you exercise to prevent dehydration or heat stroke. Body water is lost during exercise and must be replaced.  Work out until you breathe faster and your heart beats faster. This information is not intended to replace advice given to you by your health care provider. Make sure you discuss any questions you have with your health care provider. Document Released: 10/14/2010 Document Revised: 02/17/2016 Document Reviewed: 02/12/2014 Elsevier Interactive Patient Education  2018 Elsevier Inc.  

## 2017-07-21 ENCOUNTER — Other Ambulatory Visit: Payer: Self-pay | Admitting: Family

## 2017-07-21 DIAGNOSIS — D509 Iron deficiency anemia, unspecified: Secondary | ICD-10-CM | POA: Insufficient documentation

## 2017-07-21 DIAGNOSIS — E559 Vitamin D deficiency, unspecified: Secondary | ICD-10-CM

## 2017-07-21 LAB — CMP14+EGFR
ALK PHOS: 97 IU/L (ref 39–117)
ALT: 14 IU/L (ref 0–32)
AST: 10 IU/L (ref 0–40)
Albumin/Globulin Ratio: 1.5 (ref 1.2–2.2)
Albumin: 3.9 g/dL (ref 3.5–5.5)
BUN/Creatinine Ratio: 13 (ref 9–23)
BUN: 10 mg/dL (ref 6–24)
Bilirubin Total: 0.4 mg/dL (ref 0.0–1.2)
CO2: 23 mmol/L (ref 20–29)
CREATININE: 0.8 mg/dL (ref 0.57–1.00)
Calcium: 8.8 mg/dL (ref 8.7–10.2)
Chloride: 105 mmol/L (ref 96–106)
GFR calc Af Amer: 103 mL/min/{1.73_m2} (ref >59)
GFR calc non Af Amer: 89 mL/min/{1.73_m2} (ref >59)
GLOBULIN, TOTAL: 2.6 g/dL (ref 1.5–4.5)
GLUCOSE: 82 mg/dL (ref 65–99)
Potassium: 4.5 mmol/L (ref 3.5–5.2)
SODIUM: 140 mmol/L (ref 134–144)
Total Protein: 6.5 g/dL (ref 6.0–8.5)

## 2017-07-21 LAB — LIPID PANEL
CHOLESTEROL TOTAL: 161 mg/dL (ref 100–199)
Chol/HDL Ratio: 2.6 ratio (ref 0.0–4.4)
HDL: 61 mg/dL (ref >39)
LDL CALC: 83 mg/dL (ref 0–99)
TRIGLYCERIDES: 85 mg/dL (ref 0–149)
VLDL Cholesterol Cal: 17 mg/dL (ref 5–40)

## 2017-07-21 LAB — ANEMIA PROFILE B
BASOS ABS: 0.1 10*3/uL (ref 0.0–0.2)
Basos: 1 %
EOS (ABSOLUTE): 0.2 10*3/uL (ref 0.0–0.4)
Eos: 2 %
FOLATE: 8.1 ng/mL (ref >3.0)
Ferritin: 7 ng/mL — ABNORMAL LOW (ref 15–150)
Hematocrit: 34.1 % (ref 34.0–46.6)
Hemoglobin: 10.6 g/dL — ABNORMAL LOW (ref 11.1–15.9)
IRON SATURATION: 11 % — AB (ref 15–55)
Immature Grans (Abs): 0 10*3/uL (ref 0.0–0.1)
Immature Granulocytes: 0 %
Iron: 45 ug/dL (ref 27–159)
LYMPHS ABS: 2 10*3/uL (ref 0.7–3.1)
LYMPHS: 22 %
MCH: 26.6 pg (ref 26.6–33.0)
MCHC: 31.1 g/dL — AB (ref 31.5–35.7)
MCV: 86 fL (ref 79–97)
MONOS ABS: 0.7 10*3/uL (ref 0.1–0.9)
Monocytes: 7 %
NEUTROS PCT: 68 %
Neutrophils Absolute: 6.4 10*3/uL (ref 1.4–7.0)
PLATELETS: 287 10*3/uL (ref 150–379)
RBC: 3.98 x10E6/uL (ref 3.77–5.28)
RDW: 16.4 % — AB (ref 12.3–15.4)
Retic Ct Pct: 2.1 % (ref 0.6–2.6)
Total Iron Binding Capacity: 416 ug/dL (ref 250–450)
UIBC: 371 ug/dL (ref 131–425)
Vitamin B-12: 492 pg/mL (ref 232–1245)
WBC: 9.3 10*3/uL (ref 3.4–10.8)

## 2017-07-21 LAB — TSH: TSH: 1.1 u[IU]/mL (ref 0.450–4.500)

## 2017-07-21 LAB — VITAMIN D 25 HYDROXY (VIT D DEFICIENCY, FRACTURES): Vit D, 25-Hydroxy: 26.5 ng/mL — ABNORMAL LOW (ref 30.0–100.0)

## 2017-07-21 MED ORDER — VITAMIN D (ERGOCALCIFEROL) 1.25 MG (50000 UNIT) PO CAPS: 50000.0000 [IU] | ORAL_CAPSULE | ORAL | 3 refills | Status: DC

## 2017-07-23 ENCOUNTER — Other Ambulatory Visit: Payer: Self-pay | Admitting: Nurse Practitioner

## 2017-07-23 DIAGNOSIS — I10 Essential (primary) hypertension: Secondary | ICD-10-CM

## 2017-07-30 ENCOUNTER — Encounter: Payer: Self-pay | Admitting: *Deleted

## 2017-09-21 ENCOUNTER — Other Ambulatory Visit: Payer: Self-pay | Admitting: Family

## 2017-10-16 DIAGNOSIS — Z1389 Encounter for screening for other disorder: Secondary | ICD-10-CM | POA: Diagnosis not present

## 2017-10-16 DIAGNOSIS — Z01419 Encounter for gynecological examination (general) (routine) without abnormal findings: Secondary | ICD-10-CM | POA: Diagnosis not present

## 2017-10-16 DIAGNOSIS — Z1231 Encounter for screening mammogram for malignant neoplasm of breast: Secondary | ICD-10-CM | POA: Diagnosis not present

## 2017-10-16 DIAGNOSIS — Z6841 Body Mass Index (BMI) 40.0 and over, adult: Secondary | ICD-10-CM | POA: Diagnosis not present

## 2017-10-16 DIAGNOSIS — N920 Excessive and frequent menstruation with regular cycle: Secondary | ICD-10-CM | POA: Diagnosis not present

## 2017-10-16 DIAGNOSIS — Z113 Encounter for screening for infections with a predominantly sexual mode of transmission: Secondary | ICD-10-CM | POA: Diagnosis not present

## 2017-10-16 DIAGNOSIS — Z130 Encounter for screening for diseases of the blood and blood-forming organs and certain disorders involving the immune mechanism: Secondary | ICD-10-CM | POA: Diagnosis not present

## 2017-10-18 ENCOUNTER — Ambulatory Visit: Payer: BLUE CROSS/BLUE SHIELD | Admitting: Family

## 2017-10-22 ENCOUNTER — Encounter: Payer: Self-pay | Admitting: Family

## 2017-10-22 ENCOUNTER — Ambulatory Visit (INDEPENDENT_AMBULATORY_CARE_PROVIDER_SITE_OTHER): Payer: BLUE CROSS/BLUE SHIELD | Admitting: Family

## 2017-10-22 VITALS — BP 115/74 | HR 64 | Temp 96.9°F | Ht 64.0 in | Wt 251.0 lb

## 2017-10-22 DIAGNOSIS — Z713 Dietary counseling and surveillance: Secondary | ICD-10-CM

## 2017-10-22 DIAGNOSIS — F908 Attention-deficit hyperactivity disorder, other type: Secondary | ICD-10-CM | POA: Diagnosis not present

## 2017-10-22 MED ORDER — LORCASERIN HCL ER 20 MG PO TB24: 20.0000 mg | ORAL_TABLET | Freq: Every day | ORAL | 2 refills | Status: DC

## 2017-10-22 MED ORDER — METHYLPHENIDATE HCL ER (OSM) 36 MG PO TBCR: 36.0000 mg | EXTENDED_RELEASE_TABLET | Freq: Every day | ORAL | 0 refills | Status: DC

## 2017-10-22 MED ORDER — METHYLPHENIDATE HCL 20 MG PO TABS: 20.0000 mg | ORAL_TABLET | Freq: Every day | ORAL | 0 refills | Status: DC | PRN

## 2017-10-22 MED ORDER — METHYLPHENIDATE HCL ER (OSM) 36 MG PO TBCR
36.0000 mg | EXTENDED_RELEASE_TABLET | Freq: Every day | ORAL | 0 refills | Status: DC
Start: 2017-10-22 — End: 2017-11-30

## 2017-10-22 NOTE — Progress Notes (Signed)
   Subjective:    Patient ID: Ashley Bradford, female    DOB: 08/24/1971, 47 y.o.   MRN: [id number]  HPI PT presents to the office today for ADHD follow up. Currently taking Methylphenidate 36 mg daily and Ritalin as needed for school exams.  PT states this is working well at this time with no complaints. No adverse effects .   PT requesting refill on her Belviq. PT reports lost about 8 pounds with the holidays the last few months she believes she has gained a few pounds. Pt reports she has not been as active as she should, but plans on becoming more active.    Review of Systems  All other systems reviewed and are negative.      Objective:   Physical Exam  Constitutional: She is oriented to person, place, and time. She appears well-developed and well-nourished. No distress.  Morbid obese   HENT:  Head: Normocephalic and atraumatic.  Right Ear: External ear normal.  Left Ear: External ear normal.  Nose: Nose normal.  Mouth/Throat: Oropharynx is clear and moist.  Eyes: Pupils are equal, round, and reactive to light.  Neck: Normal range of motion. Neck supple. No thyromegaly present.  Cardiovascular: Normal rate, regular rhythm, normal heart sounds and intact distal pulses.  No murmur heard. Pulmonary/Chest: Effort normal and breath sounds normal. No respiratory distress. She has no wheezes.  Abdominal: Soft. Bowel sounds are normal. She exhibits no distension. There is no tenderness.  Musculoskeletal: Normal range of motion. She exhibits no edema or tenderness.  Neurological: She is alert and oriented to person, place, and time.  Skin: Skin is warm and dry.  Psychiatric: She has a normal mood and affect. Her behavior is normal. Judgment and thought content normal.  Vitals reviewed.     BP 115/74   Pulse 64   Temp (!) 96.9 F (36.1 C) (Oral)   Ht _0  (1.626 m)   Wt 251 lb (113.9 kg)   BMI 43.08 kg/m      Assessment & Plan:  1. ADHD, adult residual type Meds as  prescribed Behavior modification as needed Follow-up for recheck in 3 months - methylphenidate 36 MG PO CR tablet; Take 1 tablet (36 mg total) by mouth daily.  Dispense: 30 tablet; Refill: 0 - methylphenidate 36 MG PO CR tablet; Take 1 tablet (36 mg total) by mouth daily.  Dispense: 30 tablet; Refill: 0 - methylphenidate 36 MG PO CR tablet; Take 1 tablet (36 mg total) by mouth daily.  Dispense: 30 tablet; Refill: 0 - methylphenidate (RITALIN) 20 MG tablet; Take 1 tablet (20 mg total) by mouth daily as needed.  Dispense: 20 tablet; Refill: 0 - CMP14+EGFR  2. Weight loss counseling, encounter for Encourage weight loss and healthy diet Discussed she will have to lose 5% of weight loss to continue Belviq  - CMP14+EGFR  3. Morbid obesity ([place]) - CMP14+EGFR    Evelina Dun, FNP

## 2017-10-22 NOTE — Patient Instructions (Signed)
Exercising to Lose Weight Exercising can help you to lose weight. In order to lose weight through exercise, you need to do vigorous-intensity exercise. You can tell that you are exercising with vigorous intensity if you are breathing very hard and fast and cannot hold a conversation while exercising. Moderate-intensity exercise helps to maintain your current weight. You can tell that you are exercising at a moderate level if you have a higher heart rate and faster breathing, but you are still able to hold a conversation. How often should I exercise? Choose an activity that you enjoy and set realistic goals. Your health care provider can help you to make an activity plan that works for you. Exercise regularly as directed by your health care provider. This may include:  Doing resistance training twice each week, such as: ? Push-ups. ? Sit-ups. ? Lifting weights. ? Using resistance bands.  Doing a given intensity of exercise for a given amount of time. Choose from these options: ? 150 minutes of moderate-intensity exercise every week. ? 75 minutes of vigorous-intensity exercise every week. ? A mix of moderate-intensity and vigorous-intensity exercise every week.  Children, pregnant women, people who are out of shape, people who are overweight, and older adults may need to consult a health care provider for individual recommendations. If you have any sort of medical condition, be sure to consult your health care provider before starting a new exercise program. What are some activities that can help me to lose weight?  Walking at a rate of at least 4.5 miles an hour.  Jogging or running at a rate of 5 miles per hour.  Biking at a rate of at least 10 miles per hour.  Lap swimming.  Roller-skating or in-line skating.  Cross-country skiing.  Vigorous competitive sports, such as football, basketball, and soccer.  Jumping rope.  Aerobic dancing. How can I be more active in my day-to-day  activities?  Use the stairs instead of the elevator.  Take a walk during your lunch break.  If you drive, park your car farther away from work or school.  If you take public transportation, get off one stop early and walk the rest of the way.  Make all of your phone calls while standing up and walking around.  Get up, stretch, and walk around every 30 minutes throughout the day. What guidelines should I follow while exercising?  Do not exercise so much that you hurt yourself, feel dizzy, or get very short of breath.  Consult your health care provider prior to starting a new exercise program.  Wear comfortable clothes and shoes with good support.  Drink plenty of water while you exercise to prevent dehydration or heat stroke. Body water is lost during exercise and must be replaced.  Work out until you breathe faster and your heart beats faster. This information is not intended to replace advice given to you by your health care provider. Make sure you discuss any questions you have with your health care provider. Document Released: 10/14/2010 Document Revised: 02/17/2016 Document Reviewed: 02/12/2014 Elsevier Interactive Patient Education  2018 Elsevier Inc.  

## 2017-10-23 LAB — CMP14+EGFR
A/G RATIO: 1.7 (ref 1.2–2.2)
ALT: 16 IU/L (ref 0–32)
AST: 12 IU/L (ref 0–40)
Albumin: 4.3 g/dL (ref 3.5–5.5)
Alkaline Phosphatase: 81 IU/L (ref 39–117)
BILIRUBIN TOTAL: 0.2 mg/dL (ref 0.0–1.2)
BUN/Creatinine Ratio: 21 (ref 9–23)
BUN: 17 mg/dL (ref 6–24)
CHLORIDE: 104 mmol/L (ref 96–106)
CO2: 24 mmol/L (ref 20–29)
Calcium: 9.2 mg/dL (ref 8.7–10.2)
Creatinine, Ser: 0.8 mg/dL (ref 0.57–1.00)
GFR calc Af Amer: 102 mL/min/{1.73_m2} (ref >59)
GFR calc non Af Amer: 89 mL/min/{1.73_m2} (ref >59)
GLUCOSE: 89 mg/dL (ref 65–99)
Globulin, Total: 2.5 g/dL (ref 1.5–4.5)
Potassium: 4.4 mmol/L (ref 3.5–5.2)
Sodium: 142 mmol/L (ref 134–144)
Total Protein: 6.8 g/dL (ref 6.0–8.5)

## 2017-10-28 ENCOUNTER — Other Ambulatory Visit: Payer: Self-pay | Admitting: Nurse Practitioner

## 2017-10-28 DIAGNOSIS — K219 Gastro-esophageal reflux disease without esophagitis: Secondary | ICD-10-CM

## 2017-11-01 DIAGNOSIS — Z79899 Other long term (current) drug therapy: Secondary | ICD-10-CM | POA: Diagnosis not present

## 2017-11-01 DIAGNOSIS — I10 Essential (primary) hypertension: Secondary | ICD-10-CM | POA: Diagnosis not present

## 2017-11-01 DIAGNOSIS — E559 Vitamin D deficiency, unspecified: Secondary | ICD-10-CM | POA: Diagnosis not present

## 2017-11-01 DIAGNOSIS — Z131 Encounter for screening for diabetes mellitus: Secondary | ICD-10-CM | POA: Diagnosis not present

## 2017-11-01 DIAGNOSIS — Z136 Encounter for screening for cardiovascular disorders: Secondary | ICD-10-CM | POA: Diagnosis not present

## 2017-11-01 DIAGNOSIS — D649 Anemia, unspecified: Secondary | ICD-10-CM | POA: Diagnosis not present

## 2017-11-01 DIAGNOSIS — Z6841 Body Mass Index (BMI) 40.0 and over, adult: Secondary | ICD-10-CM | POA: Diagnosis not present

## 2017-11-16 DIAGNOSIS — I10 Essential (primary) hypertension: Secondary | ICD-10-CM | POA: Diagnosis not present

## 2017-11-16 DIAGNOSIS — K219 Gastro-esophageal reflux disease without esophagitis: Secondary | ICD-10-CM | POA: Diagnosis not present

## 2017-11-16 DIAGNOSIS — Z6841 Body Mass Index (BMI) 40.0 and over, adult: Secondary | ICD-10-CM | POA: Diagnosis not present

## 2017-11-22 NOTE — Patient Instructions (Addendum)
Your procedure is scheduled on: THURSDAY, MARCH 14  [facility] through [facility] at: 6 AM  Pick up the phone at the desk and dial [phone].  Call this number if you have problems the morning of surgery: [phone].  Remember: Do NOT eat or Do NOT drink clear liquids (including water) after midnight Wednesday.  Take these medicines the morning of surgery with a SIP OF WATER:  Bystolic, zyrtec, protonix.  Bring albuterol inhaler with you on day of surgery.  Do Not smoke on the day of surgery.  Stop herbal medications and supplements at this time.  Do NOT wear jewelry (body piercing), metal hair clips/bobby pins, make-up, or nail polish. Do NOT wear lotions, powders, or perfumes.  You may wear deoderant. Do NOT shave for 48 hours prior to surgery. Do NOT bring valuables to the hospital. Contacts, dentures, or bridgework may not be worn into surgery.  Leave suitcase in car.  After surgery it may be brought to your room.  For patients admitted to the hospital, checkout time is 11:00 AM the day of discharge. Have a responsible adult drive you home and stay with you for 24 hours after your procedure

## 2017-11-30 ENCOUNTER — Other Ambulatory Visit: Payer: Self-pay

## 2017-11-30 ENCOUNTER — Encounter (HOSPITAL_COMMUNITY): Payer: Self-pay

## 2017-11-30 ENCOUNTER — Encounter (HOSPITAL_COMMUNITY)
Admission: RE | Admit: 2017-11-30 | Discharge: 2017-11-30 | Disposition: A | Discharge status: Home / Self Care | Payer: BLUE CROSS/BLUE SHIELD | Source: Ambulatory Visit | Attending: Obstetrics and Gynecology | Admitting: Obstetrics and Gynecology

## 2017-11-30 DIAGNOSIS — Z01812 Encounter for preprocedural laboratory examination: Secondary | ICD-10-CM | POA: Diagnosis not present

## 2017-11-30 HISTORY — DX: Anemia, unspecified: D64.9

## 2017-11-30 HISTORY — DX: Family history of other specified conditions: Z84.89

## 2017-11-30 LAB — CBC
HCT: 37 % (ref 36.0–46.0)
HEMOGLOBIN: 12.2 g/dL (ref 12.0–15.0)
MCH: 28.5 pg (ref 26.0–34.0)
MCHC: 33 g/dL (ref 30.0–36.0)
MCV: 86.4 fL (ref 78.0–100.0)
PLATELETS: 272 10*3/uL (ref 150–400)
RBC: 4.28 MIL/uL (ref 3.87–5.11)
RDW: 16.3 % — ABNORMAL HIGH (ref 11.5–15.5)
WBC: 9.9 10*3/uL (ref 4.0–10.5)

## 2017-11-30 LAB — BASIC METABOLIC PANEL
ANION GAP: 7 (ref 5–15)
BUN: 13 mg/dL (ref 6–20)
CALCIUM: 9 mg/dL (ref 8.9–10.3)
CO2: 24 mmol/L (ref 22–32)
CREATININE: 0.77 mg/dL (ref 0.44–1.00)
Chloride: 104 mmol/L (ref 101–111)
GFR calc non Af Amer: >60 mL/min (ref >60)
Glucose, Bld: 81 mg/dL (ref 65–99)
Potassium: 4.2 mmol/L (ref 3.5–5.1)
SODIUM: 135 mmol/L (ref 135–145)

## 2017-11-30 LAB — TYPE AND SCREEN
ABO/RH(D): A POS
Antibody Screen: NEGATIVE

## 2017-11-30 LAB — ABO/RH: ABO/RH(D): A POS

## 2017-12-05 NOTE — H&P (Signed)
Ashley Bradford is an 47 y.o. female G4P2 presenting for scheduled TLH/BS for a history of menorrhagia with attempted novasure ablation 4/18, but cavity was too narrow, benign sampling at that time. Menses is every 28 days and very heavy, has made her anemic and takes iron as much as her GI system lets her. h/o c-section x 2 and no vaginal deliveries.  She had a BTL years ago.  Pap smear 2016 negative and HPV negative.  Pertinent Gynecological History:  OB History: c-section x 2   Menstrual History:  No LMP recorded.    Past Medical History:  Diagnosis Date  . Anemia   . Asthma    EXERCISE INDUCED  . Family history of adverse reaction to anesthesia    HARD TO WAKE UP  . GERD (gastroesophageal reflux disease)   . Headache    MIGRAINES  . History of kidney stones   . Hypertension   . Left ureteral calculus   . Urgency of urination     Past Surgical History:  Procedure Laterality Date  . ABDOMINOPLASTY/PANNICULECTOMY  2004  . BREAST REDUCTION SURGERY  2005  . CESAREAN SECTION  07-02-2001 &   1997   W/  BILATERAL TUBAL LIGATION  . NEPHROLITHOTOMY Right 1996    Family History  Problem Relation Age of Onset  . Hypertension Mother   . Hypertension Father     Social History:  reports that  has never smoked. she has never used smokeless tobacco. She reports that she drinks alcohol. She reports that she does not use drugs.  Allergies:  Allergies  Allergen Reactions  . Peanut-Containing Drug Products Anaphylaxis  . Hydromorphone Itching  . Morphine And Related Itching  . Sulfa Antibiotics Rash    No medications prior to admission.    Review of Systems  Constitutional: Negative for fever.  Gastrointestinal: Negative for abdominal pain.  Neurological: Negative for headaches.    There were no vitals taken for this visit. Physical Exam  Constitutional:  overweight  Cardiovascular: Normal rate and regular rhythm.  Respiratory: Effort normal.  GI: Soft.   Genitourinary: Vagina normal and uterus normal.  Genitourinary Comments: Cervix with minimal descensus  Neurological: She is alert.  Psychiatric: She has a normal mood and affect.    No results found for this or any previous visit (from the past 24 hour(s)).  No results found.  Assessment/Plan: Pt was counseled on the risks and benfits of TLH/BS including bleeding, infection, and possible damage to bowel and bladder or ureters. She would accept a transfusion if needed. We reviewed the procedure in detail including laparoscopic vs vaginal closure of cuff. We discussed a possible incision in the event of any complication and possible delayed recovery from that. We discussed retaining her ovaries unless pathology noted but removing tubes for theoretical benefit of reduced ovarian cancer risk. She is ready to proceed.  Ashley Bradford 12/05/2017, 8:12 PM

## 2017-12-06 ENCOUNTER — Ambulatory Visit (HOSPITAL_COMMUNITY): Payer: BLUE CROSS/BLUE SHIELD | Admitting: Anesthesiology

## 2017-12-06 ENCOUNTER — Encounter (HOSPITAL_COMMUNITY)
Admission: AD | Disposition: A | Discharge status: Home / Self Care | Payer: Self-pay | Source: Ambulatory Visit | Attending: Obstetrics and Gynecology

## 2017-12-06 ENCOUNTER — Encounter (HOSPITAL_COMMUNITY): Payer: Self-pay

## 2017-12-06 ENCOUNTER — Ambulatory Visit (HOSPITAL_COMMUNITY)
Admission: AD | Admit: 2017-12-06 | Discharge: 2017-12-07 | Disposition: A | Discharge status: Home / Self Care | Payer: BLUE CROSS/BLUE SHIELD | Source: Ambulatory Visit | Attending: Obstetrics and Gynecology | Admitting: Obstetrics and Gynecology

## 2017-12-06 DIAGNOSIS — N7011 Chronic salpingitis: Secondary | ICD-10-CM | POA: Insufficient documentation

## 2017-12-06 DIAGNOSIS — G4700 Insomnia, unspecified: Secondary | ICD-10-CM | POA: Diagnosis not present

## 2017-12-06 DIAGNOSIS — K219 Gastro-esophageal reflux disease without esophagitis: Secondary | ICD-10-CM | POA: Diagnosis not present

## 2017-12-06 DIAGNOSIS — Z885 Allergy status to narcotic agent status: Secondary | ICD-10-CM | POA: Diagnosis not present

## 2017-12-06 DIAGNOSIS — J45909 Unspecified asthma, uncomplicated: Secondary | ICD-10-CM | POA: Insufficient documentation

## 2017-12-06 DIAGNOSIS — I10 Essential (primary) hypertension: Secondary | ICD-10-CM | POA: Insufficient documentation

## 2017-12-06 DIAGNOSIS — Z87442 Personal history of urinary calculi: Secondary | ICD-10-CM | POA: Diagnosis not present

## 2017-12-06 DIAGNOSIS — N946 Dysmenorrhea, unspecified: Secondary | ICD-10-CM | POA: Diagnosis not present

## 2017-12-06 DIAGNOSIS — N72 Inflammatory disease of cervix uteri: Secondary | ICD-10-CM | POA: Insufficient documentation

## 2017-12-06 DIAGNOSIS — Z6841 Body Mass Index (BMI) 40.0 and over, adult: Secondary | ICD-10-CM | POA: Diagnosis not present

## 2017-12-06 DIAGNOSIS — Z882 Allergy status to sulfonamides status: Secondary | ICD-10-CM | POA: Insufficient documentation

## 2017-12-06 DIAGNOSIS — N838 Other noninflammatory disorders of ovary, fallopian tube and broad ligament: Secondary | ICD-10-CM | POA: Diagnosis not present

## 2017-12-06 DIAGNOSIS — Z90710 Acquired absence of both cervix and uterus: Secondary | ICD-10-CM | POA: Diagnosis present

## 2017-12-06 DIAGNOSIS — N920 Excessive and frequent menstruation with regular cycle: Secondary | ICD-10-CM

## 2017-12-06 HISTORY — PX: TOTAL LAPAROSCOPIC HYSTERECTOMY WITH SALPINGECTOMY: SHX6742

## 2017-12-06 LAB — PREGNANCY, URINE: PREG TEST UR: NEGATIVE

## 2017-12-06 SURGERY — HYSTERECTOMY, TOTAL, LAPAROSCOPIC, WITH SALPINGECTOMY
Anesthesia: General | Laterality: Bilateral

## 2017-12-06 MED ORDER — ALBUTEROL SULFATE (2.5 MG/3ML) 0.083% IN NEBU: 3.0000 mL | INHALATION_SOLUTION | Freq: Four times a day (QID) | RESPIRATORY_TRACT | Status: DC | PRN

## 2017-12-06 MED ORDER — METHYLPHENIDATE HCL 20 MG PO TABS: 20.0000 mg | ORAL_TABLET | Freq: Every day | ORAL | Status: DC | PRN

## 2017-12-06 MED ORDER — FENTANYL CITRATE (PF) 250 MCG/5ML IJ SOLN
INTRAMUSCULAR | Status: AC
  Filled 2017-12-06: qty 5

## 2017-12-06 MED ORDER — SODIUM CHLORIDE 0.9 % IJ SOLN
INTRAMUSCULAR | Status: AC
  Filled 2017-12-06: qty 20

## 2017-12-06 MED ORDER — SCOPOLAMINE 1 MG/3DAYS TD PT72
1.0000 | MEDICATED_PATCH | Freq: Once | TRANSDERMAL | Status: DC
  Administered 2017-12-06: 1.5 mg via TRANSDERMAL

## 2017-12-06 MED ORDER — DIPHENHYDRAMINE HCL 50 MG/ML IJ SOLN
INTRAMUSCULAR | Status: DC | PRN
  Administered 2017-12-06: 25 mg via INTRAVENOUS

## 2017-12-06 MED ORDER — ONDANSETRON HCL 4 MG/2ML IJ SOLN
INTRAMUSCULAR | Status: AC
  Filled 2017-12-06: qty 2

## 2017-12-06 MED ORDER — FENTANYL CITRATE (PF) 100 MCG/2ML IJ SOLN
INTRAMUSCULAR | Status: AC
  Administered 2017-12-06: 50 ug via INTRAVENOUS
  Filled 2017-12-06: qty 2

## 2017-12-06 MED ORDER — SUGAMMADEX SODIUM 200 MG/2ML IV SOLN
INTRAVENOUS | Status: AC
  Filled 2017-12-06: qty 2

## 2017-12-06 MED ORDER — IBUPROFEN 600 MG PO TABS: 600.0000 mg | ORAL_TABLET | Freq: Four times a day (QID) | ORAL | Status: DC | PRN

## 2017-12-06 MED ORDER — KETOROLAC TROMETHAMINE 30 MG/ML IJ SOLN
30.0000 mg | Freq: Four times a day (QID) | INTRAMUSCULAR | Status: AC
  Administered 2017-12-06: 30 mg via INTRAVENOUS
  Filled 2017-12-06: qty 1

## 2017-12-06 MED ORDER — PROMETHAZINE HCL 25 MG PO TABS
12.5000 mg | ORAL_TABLET | Freq: Four times a day (QID) | ORAL | Status: DC | PRN
  Administered 2017-12-06: 12.5 mg via ORAL
  Filled 2017-12-06: qty 1

## 2017-12-06 MED ORDER — DEXAMETHASONE SODIUM PHOSPHATE 10 MG/ML IJ SOLN
INTRAMUSCULAR | Status: DC | PRN
  Administered 2017-12-06: 4 mg via INTRAVENOUS

## 2017-12-06 MED ORDER — PROPOFOL 10 MG/ML IV BOLUS
INTRAVENOUS | Status: DC | PRN
  Administered 2017-12-06: 180 mg via INTRAVENOUS

## 2017-12-06 MED ORDER — DEXAMETHASONE SODIUM PHOSPHATE 4 MG/ML IJ SOLN
INTRAMUSCULAR | Status: AC
  Filled 2017-12-06: qty 1

## 2017-12-06 MED ORDER — LACTATED RINGERS IV SOLN
INTRAVENOUS | Status: DC
  Administered 2017-12-06: 100 mL/h via INTRAVENOUS
  Administered 2017-12-06 (×3): via INTRAVENOUS

## 2017-12-06 MED ORDER — FENTANYL CITRATE (PF) 100 MCG/2ML IJ SOLN
25.0000 ug | INTRAMUSCULAR | Status: DC | PRN
  Administered 2017-12-06: 50 ug via INTRAVENOUS

## 2017-12-06 MED ORDER — METHYLPHENIDATE HCL ER (OSM) 36 MG PO TBCR: 36.0000 mg | EXTENDED_RELEASE_TABLET | Freq: Every day | ORAL | Status: DC

## 2017-12-06 MED ORDER — FENTANYL CITRATE (PF) 100 MCG/2ML IJ SOLN
INTRAMUSCULAR | Status: DC | PRN
  Administered 2017-12-06: 100 ug via INTRAVENOUS
  Administered 2017-12-06: 150 ug via INTRAVENOUS
  Administered 2017-12-06: 50 ug via INTRAVENOUS
  Administered 2017-12-06 (×2): 100 ug via INTRAVENOUS

## 2017-12-06 MED ORDER — ONDANSETRON HCL 4 MG/2ML IJ SOLN
4.0000 mg | Freq: Four times a day (QID) | INTRAMUSCULAR | Status: DC | PRN
  Administered 2017-12-06: 4 mg via INTRAVENOUS
  Filled 2017-12-06: qty 2

## 2017-12-06 MED ORDER — ONDANSETRON HCL 4 MG PO TABS: 4.0000 mg | ORAL_TABLET | Freq: Four times a day (QID) | ORAL | Status: DC | PRN

## 2017-12-06 MED ORDER — PANTOPRAZOLE SODIUM 40 MG PO TBEC
40.0000 mg | DELAYED_RELEASE_TABLET | Freq: Every day | ORAL | Status: DC
  Administered 2017-12-06 – 2017-12-07 (×2): 40 mg via ORAL
  Filled 2017-12-06 (×2): qty 1

## 2017-12-06 MED ORDER — MENTHOL 3 MG MT LOZG: 1.0000 | LOZENGE | OROMUCOSAL | Status: DC | PRN

## 2017-12-06 MED ORDER — KETOROLAC TROMETHAMINE 30 MG/ML IJ SOLN
INTRAMUSCULAR | Status: AC
  Filled 2017-12-06: qty 1

## 2017-12-06 MED ORDER — HYDROCODONE-ACETAMINOPHEN 5-325 MG PO TABS
1.0000 | ORAL_TABLET | ORAL | Status: DC | PRN
  Administered 2017-12-06: 2 via ORAL
  Administered 2017-12-06: 1 via ORAL
  Administered 2017-12-07 (×3): 2 via ORAL
  Filled 2017-12-06: qty 1
  Filled 2017-12-06 (×4): qty 2
  Filled 2017-12-06: qty 1

## 2017-12-06 MED ORDER — LIDOCAINE HCL (CARDIAC) 20 MG/ML IV SOLN
INTRAVENOUS | Status: AC
  Filled 2017-12-06: qty 5

## 2017-12-06 MED ORDER — CEFAZOLIN SODIUM-DEXTROSE 2-4 GM/100ML-% IV SOLN
2.0000 g | INTRAVENOUS | Status: AC
  Administered 2017-12-06: 2 g via INTRAVENOUS

## 2017-12-06 MED ORDER — MIDAZOLAM HCL 5 MG/5ML IJ SOLN
INTRAMUSCULAR | Status: DC | PRN
  Administered 2017-12-06: 2 mg via INTRAVENOUS

## 2017-12-06 MED ORDER — MEPERIDINE HCL 25 MG/ML IJ SOLN: 6.2500 mg | INTRAMUSCULAR | Status: DC | PRN

## 2017-12-06 MED ORDER — SODIUM CHLORIDE 0.9 % IJ SOLN
INTRAMUSCULAR | Status: DC | PRN
  Administered 2017-12-06: 10 mL

## 2017-12-06 MED ORDER — LIDOCAINE HCL 1 % IJ SOLN
INTRAMUSCULAR | Status: AC
  Filled 2017-12-06: qty 20

## 2017-12-06 MED ORDER — BUPIVACAINE HCL (PF) 0.25 % IJ SOLN
INTRAMUSCULAR | Status: AC
  Filled 2017-12-06: qty 30

## 2017-12-06 MED ORDER — KETOROLAC TROMETHAMINE 0.5 % OP SOLN
1.0000 [drp] | Freq: Four times a day (QID) | OPHTHALMIC | Status: DC
  Administered 2017-12-06 – 2017-12-07 (×3): 1 [drp] via OPHTHALMIC
  Filled 2017-12-06: qty 3

## 2017-12-06 MED ORDER — ROCURONIUM BROMIDE 100 MG/10ML IV SOLN
INTRAVENOUS | Status: DC | PRN
  Administered 2017-12-06: 20 mg via INTRAVENOUS
  Administered 2017-12-06: 50 mg via INTRAVENOUS
  Administered 2017-12-06 (×2): 20 mg via INTRAVENOUS
  Administered 2017-12-06: 10 mg via INTRAVENOUS

## 2017-12-06 MED ORDER — NEBIVOLOL HCL 10 MG PO TABS
10.0000 mg | ORAL_TABLET | Freq: Every morning | ORAL | Status: DC
  Filled 2017-12-06 (×2): qty 1

## 2017-12-06 MED ORDER — LACTATED RINGERS IV SOLN
INTRAVENOUS | Status: DC
  Administered 2017-12-06 – 2017-12-07 (×2): via INTRAVENOUS

## 2017-12-06 MED ORDER — DIPHENHYDRAMINE HCL 50 MG/ML IJ SOLN
INTRAMUSCULAR | Status: AC
  Filled 2017-12-06: qty 1

## 2017-12-06 MED ORDER — HYDROMORPHONE HCL 1 MG/ML IJ SOLN
INTRAMUSCULAR | Status: AC
  Filled 2017-12-06: qty 1

## 2017-12-06 MED ORDER — PROPOFOL 10 MG/ML IV BOLUS
INTRAVENOUS | Status: AC
  Filled 2017-12-06: qty 20

## 2017-12-06 MED ORDER — SODIUM CHLORIDE 0.9 % IR SOLN
Status: DC | PRN
  Administered 2017-12-06: 3000 mL

## 2017-12-06 MED ORDER — BUTORPHANOL TARTRATE 1 MG/ML IJ SOLN
1.0000 mg | INTRAMUSCULAR | Status: DC | PRN
  Administered 2017-12-06: 1 mg via INTRAVENOUS
  Filled 2017-12-06: qty 1

## 2017-12-06 MED ORDER — PHENYLEPHRINE 40 MCG/ML (10ML) SYRINGE FOR IV PUSH (FOR BLOOD PRESSURE SUPPORT)
PREFILLED_SYRINGE | INTRAVENOUS | Status: AC
  Filled 2017-12-06: qty 10

## 2017-12-06 MED ORDER — PHENYLEPHRINE HCL 10 MG/ML IJ SOLN
INTRAMUSCULAR | Status: DC | PRN
  Administered 2017-12-06: 80 ug via INTRAVENOUS
  Administered 2017-12-06: 40 ug via INTRAVENOUS

## 2017-12-06 MED ORDER — ONDANSETRON HCL 4 MG/2ML IJ SOLN
INTRAMUSCULAR | Status: DC | PRN
  Administered 2017-12-06: 4 mg via INTRAVENOUS

## 2017-12-06 MED ORDER — KETOROLAC TROMETHAMINE 30 MG/ML IJ SOLN: 30.0000 mg | Freq: Four times a day (QID) | INTRAMUSCULAR | Status: AC

## 2017-12-06 MED ORDER — SCOPOLAMINE 1 MG/3DAYS TD PT72
MEDICATED_PATCH | TRANSDERMAL | Status: AC
  Administered 2017-12-06: 1.5 mg via TRANSDERMAL
  Filled 2017-12-06: qty 1

## 2017-12-06 MED ORDER — BUPIVACAINE HCL (PF) 0.25 % IJ SOLN
INTRAMUSCULAR | Status: DC | PRN
  Administered 2017-12-06: 24 mg

## 2017-12-06 MED ORDER — KETOROLAC TROMETHAMINE 30 MG/ML IJ SOLN
INTRAMUSCULAR | Status: DC | PRN
  Administered 2017-12-06: 30 mg via INTRAVENOUS

## 2017-12-06 MED ORDER — SENNOSIDES-DOCUSATE SODIUM 8.6-50 MG PO TABS
1.0000 | ORAL_TABLET | Freq: Every evening | ORAL | Status: DC | PRN
Start: 2017-12-06 — End: 2017-12-07
  Administered 2017-12-06: 1 via ORAL
  Filled 2017-12-06: qty 1

## 2017-12-06 MED ORDER — MIDAZOLAM HCL 2 MG/2ML IJ SOLN
INTRAMUSCULAR | Status: AC
  Filled 2017-12-06: qty 2

## 2017-12-06 MED ORDER — ROCURONIUM BROMIDE 100 MG/10ML IV SOLN
INTRAVENOUS | Status: AC
  Filled 2017-12-06: qty 1

## 2017-12-06 MED ORDER — SUGAMMADEX SODIUM 200 MG/2ML IV SOLN
INTRAVENOUS | Status: DC | PRN
  Administered 2017-12-06: 200 mg via INTRAVENOUS

## 2017-12-06 MED ORDER — CEFAZOLIN SODIUM-DEXTROSE 2-4 GM/100ML-% IV SOLN
INTRAVENOUS | Status: AC
  Filled 2017-12-06: qty 100

## 2017-12-06 MED ORDER — PROMETHAZINE HCL 25 MG/ML IJ SOLN: 6.2500 mg | INTRAMUSCULAR | Status: DC | PRN

## 2017-12-06 MED ORDER — LIDOCAINE HCL (CARDIAC) 20 MG/ML IV SOLN
INTRAVENOUS | Status: DC | PRN
  Administered 2017-12-06: 60 mg via INTRAVENOUS

## 2017-12-06 MED ORDER — SIMETHICONE 80 MG PO CHEW
80.0000 mg | CHEWABLE_TABLET | Freq: Four times a day (QID) | ORAL | Status: DC | PRN
  Administered 2017-12-06: 80 mg via ORAL
  Filled 2017-12-06: qty 1

## 2017-12-06 MED ORDER — HYDROMORPHONE HCL 1 MG/ML IJ SOLN
INTRAMUSCULAR | Status: DC | PRN
  Administered 2017-12-06: 1 mg via INTRAVENOUS

## 2017-12-06 MED ORDER — MIDAZOLAM HCL 2 MG/2ML IJ SOLN: 0.5000 mg | Freq: Once | INTRAMUSCULAR | Status: DC | PRN

## 2017-12-06 MED ORDER — LORCASERIN HCL ER 20 MG PO TB24: 20.0000 mg | ORAL_TABLET | Freq: Every day | ORAL | Status: DC

## 2017-12-06 SURGICAL SUPPLY — 43 items
ADH SKN CLS APL DERMABOND .7 (GAUZE/BANDAGES/DRESSINGS) ×1
BARRIER ADHS 3X4 INTERCEED (GAUZE/BANDAGES/DRESSINGS) ×2 IMPLANT
BRR ADH 4X3 ABS CNTRL BYND (GAUZE/BANDAGES/DRESSINGS) ×1
CABLE HIGH FREQUENCY MONO STRZ (ELECTRODE) IMPLANT
CANISTER SUCT 3000ML PPV (MISCELLANEOUS) ×2 IMPLANT
DERMABOND ADVANCED (GAUZE/BANDAGES/DRESSINGS) ×1
DERMABOND ADVANCED .7 DNX12 (GAUZE/BANDAGES/DRESSINGS) ×1 IMPLANT
DEVICE SUTURE ENDOST 10MM (ENDOMECHANICALS) ×2 IMPLANT
DISSECTOR BLUNT TIP ENDO 5MM (MISCELLANEOUS) ×1 IMPLANT
DURAPREP 26ML APPLICATOR (WOUND CARE) ×2 IMPLANT
FILTER SMOKE EVAC LAPAROSHD (FILTER) ×2 IMPLANT
GAUZE 4X4 16PLY RFD (DISPOSABLE) ×1 IMPLANT
GLOVE BIO SURGEON STRL SZ 6.5 (GLOVE) ×4 IMPLANT
GLOVE BIOGEL PI IND STRL 7.0 (GLOVE) ×2 IMPLANT
GLOVE BIOGEL PI INDICATOR 7.0 (GLOVE) ×2
GOWN STRL REUS W/TWL LRG LVL3 (GOWN DISPOSABLE) ×6 IMPLANT
NS IRRIG 1000ML POUR BTL (IV SOLUTION) ×2 IMPLANT
OCCLUDER COLPOPNEUMO (BALLOONS) ×1 IMPLANT
PACK LAPAROSCOPY BASIN (CUSTOM PROCEDURE TRAY) ×2 IMPLANT
PACK TRENDGUARD 600 HYBRD PROC (MISCELLANEOUS) IMPLANT
PROTECTOR NERVE ULNAR (MISCELLANEOUS) ×4 IMPLANT
SCISSORS LAP 5X35 DISP (ENDOMECHANICALS) ×1 IMPLANT
SET CYSTO W/LG BORE CLAMP LF (SET/KITS/TRAYS/PACK) IMPLANT
SET IRRIG TUBING LAPAROSCOPIC (IRRIGATION / IRRIGATOR) ×1 IMPLANT
SHEARS HARMONIC ACE PLUS 36CM (ENDOMECHANICALS) ×1 IMPLANT
SLEEVE XCEL OPT CAN 5 100 (ENDOMECHANICALS) ×2 IMPLANT
SOLUTION ANTI FOG 6CC (MISCELLANEOUS) ×1 IMPLANT
SUT ENDO VLOC 180-0-8IN (SUTURE) ×3 IMPLANT
SUT VIC AB 0 CT1 27 (SUTURE) ×4
SUT VIC AB 0 CT1 27XBRD ANBCTR (SUTURE) ×2 IMPLANT
SUT VIC AB 3-0 PS2 18 (SUTURE) ×2
SUT VIC AB 3-0 PS2 18XBRD (SUTURE) ×1 IMPLANT
SUT VICRYL 0 UR6 27IN ABS (SUTURE) ×2 IMPLANT
SYR 10ML LL (SYRINGE) ×2 IMPLANT
SYR 50ML LL SCALE MARK (SYRINGE) ×2 IMPLANT
SYSTEM CARTER THOMASON II (TROCAR) ×2 IMPLANT
TIP UTERINE 6.7X8CM BLUE DISP (MISCELLANEOUS) ×1 IMPLANT
TOWEL OR 17X24 6PK STRL BLUE (TOWEL DISPOSABLE) ×6 IMPLANT
TRAY FOLEY CATH SILVER 14FR (SET/KITS/TRAYS/PACK) ×2 IMPLANT
TRENDGUARD 600 HYBRID PROC PK (MISCELLANEOUS) ×2
TROCAR XCEL NON-BLD 11X100MML (ENDOMECHANICALS) ×2 IMPLANT
TROCAR XCEL NON-BLD 5MMX100MML (ENDOMECHANICALS) ×2 IMPLANT
WARMER LAPAROSCOPE (MISCELLANEOUS) ×2 IMPLANT

## 2017-12-06 NOTE — Op Note (Signed)
Operative Note    Preoperative Diagnosis Menorrhagia Dysmenorrhea Prior c-section x 2  Postoperative Diagnosis Same with dense adhesions of bladder to anterior uterus  Procedure Total Laparoscopic Hysterectomy/Bilateral salpingectomies  Surgeon Huel CoteKathy Keontre Defino, MD Pryor Ochoaecilia Banga, DO  Anesthesia GETA  Fluids: EBL 500mL UOP 400mL IVF 2500mL  Findings Uterus was enlarged to about 12 weeks size and boggy.  Ovaries were WNL except for a 2cm  simple cyst on left. There were dense adhesions of the bladder and anterior peritoneum to the anterior fundus. Appendix appeared normal     Specimen Uterus, tubes, cervix  Procedure Note Patient was taken to the operating room where general anesthesia was obtained without difficulty she was prepped and draped in the normal sterile fashion in the dorsal lithotomy position. An appropriate time out was performed. A Rumi with cervical cup was placed after dilation and stay sutures placed to hold the cup onto the cervix at 3 and 6 o'clock.  The vaginal occluder was filled and a Foley catheter placed in the bladder. Attention was then turned to the abdomen where of infraumbilical incision was made after injection with quarter percent Marcaine approximate 1 cm in width. The verees needle was introduced and intraperitoneal placed by injection and aspiration with NS.  The 5mm optiview trocar was then placed under direct visualization.  The gas was appled and pneumoperitoneum obtained with 2.5L CO2. Two additional  ports were placed under direct visualization from the lateral upper quadrants. A 5mm port was placed on the right and an eleven mm port on the left.   Each port site was injected with quarter percent Marcaine prior placement.   With patient in Trendelenburg the uterus and tubes and ovaries were inspected with findings as previously stated. The Harmonic scalpel was then utilized to dissect the fallopian tubes from the mesosalpinx bilaterally down to  the level of the cornua.  The remainder of the uteroovarian ligament and the round ligament were then also taken down with the Harmonic to the level of the bladder flap.  The bladder flap was taken down from the lower uterine segment and pushed away to expose the cervix.   This was more time consuming than usual because the tissue was densely adherent from her prior c-sections.   The uterine arteries were then taken down bilaterally and required additional cautery with the Harmonic to gain hemostasis.   After the anterior cul-de-sac was extensively disssected to free the bladder, the vaginal cuff identified and opened over the vaginal ring.  The cuff was then completely opened over the ring until the uterus was completely free.  It was then pulled down into the vagina.  The vaginal cuff was then closed with a running v-lock suture with good closure.The cuff and pedicles were hemostatic and the ureters visualized and normal in appearance. A four quadrant view of the pelvis and abdomen was performed and found to be normal with no bleeding or injuries noted.  The instruments were removed from the abdomen as well as the lateral ports under visualization.  The pneumoperitoneum was reduced through the trocar. The trocar was finally removed and the infraumbilical incision and lateral incisions were closed.  The larger left port was closed at the fascia with the Carter-Thomasson device and 0-vicryl suture and with a subcuticular stitch of 3-0 Vicryl. The 5mm ports were closed with a subcuticular stitch. Dermabond and a bandage were placed. Patient was then awakened and taken to the recovery room in good condition.

## 2017-12-06 NOTE — Anesthesia Preprocedure Evaluation (Addendum)
Anesthesia Evaluation  Patient identified by MRN, date of birth, ID band Patient awake    Reviewed: Allergy & Precautions, NPO status , Patient's Chart, lab work & pertinent test results, reviewed documented beta blocker date and time   History of Anesthesia Complications Negative for: history of anesthetic complications  Airway Mallampati: II  TM Distance: >3 FB Neck ROM: Full    Dental  (+) Dental Advisory Given   Pulmonary COPD (has not needed inhaler in over a year),  COPD inhaler,    breath sounds clear to auscultation       Cardiovascular hypertension, Pt. on medications and Pt. on home beta blockers (-) angina Rhythm:Regular Rate:Normal     Neuro/Psych negative neurological ROS     GI/Hepatic Neg liver ROS, GERD  Medicated and Controlled,  Endo/Other  Morbid obesity  Renal/GU negative Renal ROS     Musculoskeletal   Abdominal (+) + obese,   Peds  Hematology negative hematology ROS (+)   Anesthesia Other Findings   Reproductive/Obstetrics                            Anesthesia Physical Anesthesia Plan  ASA: II  Anesthesia Plan: General   Post-op Pain Management:    Induction: Intravenous  PONV Risk Score and Plan: 4 or greater and Ondansetron, Dexamethasone, Scopolamine patch - Pre-op and Diphenhydramine  Airway Management Planned: Oral ETT  Additional Equipment:   Intra-op Plan:   Post-operative Plan: Extubation in OR  Informed Consent: I have reviewed the patients History and Physical, chart, labs and discussed the procedure including the risks, benefits and alternatives for the proposed anesthesia with the patient or authorized representative who has indicated his/her understanding and acceptance.   Dental advisory given  Plan Discussed with: CRNA and Surgeon  Anesthesia Plan Comments: (Plan routine monitors, GETA Pt itches with narcotics, accepts narcotic  analgesia with treatment of itching)        Anesthesia Quick Evaluation

## 2017-12-06 NOTE — Progress Notes (Signed)
Day of Surgery Procedure(s) (LRB): TOTAL LAPAROSCOPIC HYSTERECTOMY WITH SALPINGECTOMY (Bilateral)  Subjective: Patient reports had some nausea and left lower quadrant pain.  Nausea improved with phenergan. Pain still present in lower quadrant as sharp pain, but improved.  Has not yet ambulated, but about to do so.  Tolerated stadol without itching.  Has tolerated clears this PM.    Objective: I have reviewed patient's vital signs and intake and output. Good UOP.  General: alert and cooperative GI: incision: clean, dry and intact Vaginal Bleeding: minimal  Assessment: s/p Procedure(s) with comments: TOTAL LAPAROSCOPIC HYSTERECTOMY WITH SALPINGECTOMY (Bilateral) - MD request extra OR time. 2.5hrs total OR time: stable  Plan: Advance to PO medication as tolerated Stadol and vicodin for pain.   LOS: 0 days    Oliver PilaKathy W Jhamal Plucinski 12/06/2017, 5:40 PM

## 2017-12-06 NOTE — Anesthesia Postprocedure Evaluation (Signed)
Anesthesia Post Note  Patient: Ashley Bradford  Procedure(s) Performed: TOTAL LAPAROSCOPIC HYSTERECTOMY WITH SALPINGECTOMY (Bilateral )     Patient location during evaluation: PACU Anesthesia Type: General Level of consciousness: awake and alert, oriented and patient cooperative Pain management: pain level controlled (pain improving) Vital Signs Assessment: post-procedure vital signs reviewed and stable Respiratory status: spontaneous breathing, nonlabored ventilation, respiratory function stable and patient connected to nasal cannula oxygen Cardiovascular status: blood pressure returned to baseline and stable Postop Assessment: no apparent nausea or vomiting Anesthetic complications: no Comments: Pt has history of corneal injury R eye. She has not used her saline lubricant today, and the eye feels like it needs it. Saline flush and Ketorolac gtts ordered.    Last Vitals:  Vitals:   12/06/17 1508 12/06/17 1559  BP: 128/87 125/71  Pulse: 94 85  Resp: 18 18  Temp: 37 C 36.7 C  SpO2: 99% 95%    Last Pain:  Vitals:   12/06/17 1800  TempSrc:   PainSc: 8    Pain Goal: Patients Stated Pain Goal: 4 (12/06/17 1305)               Erling CruzJACKSON,E. Karron Alvizo

## 2017-12-06 NOTE — Anesthesia Procedure Notes (Signed)
Procedure Name: Intubation Date/Time: 12/06/2017 7:36 AM Performed by: Jonna Munro, CRNA Pre-anesthesia Checklist: Patient identified, Emergency Drugs available, Suction available, Patient being monitored and Timeout performed Patient Re-evaluated:Patient Re-evaluated prior to induction Oxygen Delivery Method: Circle system utilized Preoxygenation: Pre-oxygenation with 100% oxygen Induction Type: IV induction Ventilation: Mask ventilation without difficulty Laryngoscope Size: Mac and 3 Grade View: Grade II Tube type: Oral Tube size: 7.0 mm Number of attempts: 1 Airway Equipment and Method: Stylet Placement Confirmation: ETT inserted through vocal cords under direct vision,  positive ETCO2 and breath sounds checked- equal and bilateral Secured at: 22 cm Tube secured with: Tape Dental Injury: Teeth and Oropharynx as per pre-operative assessment

## 2017-12-06 NOTE — Transfer of Care (Signed)
Immediate Anesthesia Transfer of Care Note  Patient: Ashley Bradford  Procedure(s) Performed: TOTAL LAPAROSCOPIC HYSTERECTOMY WITH SALPINGECTOMY (Bilateral )  Patient Location: PACU  Anesthesia Type:General  Level of Consciousness: awake, alert  and oriented  Airway & Oxygen Therapy: Patient Spontanous Breathing and Patient connected to nasal cannula oxygen  Post-op Assessment: Report given to RN and Post -op Vital signs reviewed and stable  Post vital signs: Reviewed and stable  Last Vitals:  Vitals:   12/06/17 0630  BP: 123/79  Pulse: 69  Resp: 16  Temp: 36.7 C  SpO2: 99%    Last Pain:  Vitals:   12/06/17 0630  TempSrc: Oral      Patients Stated Pain Goal: 4 (12/06/17 0630)  Complications: No apparent anesthesia complications

## 2017-12-06 NOTE — Progress Notes (Signed)
Patient ID: Ashley Bradford, female   DOB: 07/15/1971, [age] y.o.   MRN: [id number] Per pt no changes in dictated H&P.  Brief exam WNL.  Ready to proceed.

## 2017-12-07 ENCOUNTER — Encounter (HOSPITAL_COMMUNITY): Payer: Self-pay | Admitting: Obstetrics and Gynecology

## 2017-12-07 ENCOUNTER — Other Ambulatory Visit: Payer: Self-pay

## 2017-12-07 DIAGNOSIS — N838 Other noninflammatory disorders of ovary, fallopian tube and broad ligament: Secondary | ICD-10-CM | POA: Diagnosis not present

## 2017-12-07 DIAGNOSIS — N72 Inflammatory disease of cervix uteri: Secondary | ICD-10-CM | POA: Diagnosis not present

## 2017-12-07 DIAGNOSIS — N946 Dysmenorrhea, unspecified: Secondary | ICD-10-CM | POA: Diagnosis not present

## 2017-12-07 DIAGNOSIS — N7011 Chronic salpingitis: Secondary | ICD-10-CM | POA: Diagnosis not present

## 2017-12-07 DIAGNOSIS — I10 Essential (primary) hypertension: Secondary | ICD-10-CM | POA: Diagnosis not present

## 2017-12-07 DIAGNOSIS — Z87442 Personal history of urinary calculi: Secondary | ICD-10-CM | POA: Diagnosis not present

## 2017-12-07 DIAGNOSIS — N920 Excessive and frequent menstruation with regular cycle: Secondary | ICD-10-CM | POA: Diagnosis not present

## 2017-12-07 DIAGNOSIS — K219 Gastro-esophageal reflux disease without esophagitis: Secondary | ICD-10-CM | POA: Diagnosis not present

## 2017-12-07 DIAGNOSIS — J45909 Unspecified asthma, uncomplicated: Secondary | ICD-10-CM | POA: Diagnosis not present

## 2017-12-07 DIAGNOSIS — Z885 Allergy status to narcotic agent status: Secondary | ICD-10-CM | POA: Diagnosis not present

## 2017-12-07 DIAGNOSIS — Z6841 Body Mass Index (BMI) 40.0 and over, adult: Secondary | ICD-10-CM | POA: Diagnosis not present

## 2017-12-07 DIAGNOSIS — Z882 Allergy status to sulfonamides status: Secondary | ICD-10-CM | POA: Diagnosis not present

## 2017-12-07 LAB — BASIC METABOLIC PANEL
ANION GAP: 9 (ref 5–15)
BUN: 15 mg/dL (ref 6–20)
CHLORIDE: 108 mmol/L (ref 101–111)
CO2: 21 mmol/L — AB (ref 22–32)
CREATININE: 0.95 mg/dL (ref 0.44–1.00)
Calcium: 7.8 mg/dL — ABNORMAL LOW (ref 8.9–10.3)
GFR calc Af Amer: >60 mL/min (ref >60)
Glucose, Bld: 99 mg/dL (ref 65–99)
POTASSIUM: 3.8 mmol/L (ref 3.5–5.1)
Sodium: 138 mmol/L (ref 135–145)

## 2017-12-07 LAB — CBC
HEMATOCRIT: 26.7 % — AB (ref 36.0–46.0)
HEMOGLOBIN: 8.6 g/dL — AB (ref 12.0–15.0)
MCH: 28.4 pg (ref 26.0–34.0)
MCHC: 32.2 g/dL (ref 30.0–36.0)
MCV: 88.1 fL (ref 78.0–100.0)
Platelets: 206 10*3/uL (ref 150–400)
RBC: 3.03 MIL/uL — ABNORMAL LOW (ref 3.87–5.11)
RDW: 16.4 % — AB (ref 11.5–15.5)
WBC: 10.5 10*3/uL (ref 4.0–10.5)

## 2017-12-07 MED ORDER — KETOROLAC TROMETHAMINE 0.5 % OP SOLN: 1.0000 [drp] | Freq: Four times a day (QID) | OPHTHALMIC | 0 refills | Status: DC

## 2017-12-07 MED ORDER — IBUPROFEN 600 MG PO TABS: 600.0000 mg | ORAL_TABLET | Freq: Four times a day (QID) | ORAL | 0 refills | Status: DC | PRN

## 2017-12-07 MED ORDER — HYDROCODONE-ACETAMINOPHEN 5-325 MG PO TABS: 1.0000 | ORAL_TABLET | ORAL | 0 refills | Status: DC | PRN

## 2017-12-07 NOTE — Progress Notes (Signed)
1 Day Post-Op Procedure(s) (LRB): TOTAL LAPAROSCOPIC HYSTERECTOMY WITH SALPINGECTOMY (Bilateral)  Subjective: Patient reports tolerating PO and no problems voiding.  Pain controlled on po meds  Objective: I have reviewed patient's vital signs, intake and output and labs.  General: alert and cooperative GI: soft nt and incisions clear Vaginal Bleeding: minimal  Assessment: s/p Procedure(s) with comments: TOTAL LAPAROSCOPIC HYSTERECTOMY WITH SALPINGECTOMY (Bilateral) - MD request extra OR time. 2.5hrs total OR time: stable  Plan: Discharge home  LOS: 0 days    Oliver PilaKathy W Keeleigh Terris 12/07/2017, 8:44 AM

## 2017-12-07 NOTE — Progress Notes (Signed)
Discharge instructions and prescriptions given to pt. Discussed upcoming appointments, signs and symptoms to report to the MD, and meds. Pt verbalized understanding and has no further questions or concerns at this time. IV taken out and pt tolerated well. Pt walked out in a stable condition.

## 2017-12-07 NOTE — Addendum Note (Signed)
Addendum  created 12/07/17 0807 by Rica Recordsickelton, Derrisha Foos, CRNA   Sign clinical note

## 2017-12-07 NOTE — Anesthesia Postprocedure Evaluation (Signed)
Anesthesia Post Note  Patient: Ashley Bradford  Procedure(s) Performed: TOTAL LAPAROSCOPIC HYSTERECTOMY WITH SALPINGECTOMY (Bilateral )     Patient location during evaluation: [facility] Anesthesia Type: General Level of consciousness: awake and alert Pain management: pain level controlled Vital Signs Assessment: post-procedure vital signs reviewed and stable Respiratory status: spontaneous breathing, nonlabored ventilation, respiratory function stable and patient connected to nasal cannula oxygen Cardiovascular status: blood pressure returned to baseline and stable Postop Assessment: no apparent nausea or vomiting Anesthetic complications: no    Last Vitals:  Vitals:   12/07/17 0005 12/07/17 0414  BP: (!) 111/54 (!) 99/49  Pulse: 93 72  Resp: 17 16  Temp: 37.5 C 36.6 C  SpO2: 99% 95%    Last Pain:  Vitals:   12/07/17 0739  TempSrc:   PainSc: 6    Pain Goal: Patients Stated Pain Goal: 2 (12/07/17 0739)               Rica RecordsICKELTON,Lilo [place]

## 2017-12-07 NOTE — Discharge Summary (Signed)
Physician Discharge Summary  Patient ID: Ashley Bradford MRN: [id number] DOB/AGE: 47/10/1970 47 y.o.  Admit date: 12/06/2017 Discharge date: 12/07/2017  Admission Diagnoses: menorrhagia                                         dysmenorrhea  Discharge Diagnoses:  Active Problems:   S/P laparoscopic hysterectomy   Discharged Condition: good  Hospital Course: Pt admitted for routine postoperative care s/p TLH/BS.  By post-op day #1, was tolerating regular diet, ambulating, and voiding without difficulty.  Labs were stable.  Consults: None  Significant Diagnostic Studies: labs: CBC, BMP  Treatments: surgery: TLH/BS  Discharge Exam: Blood pressure (!) 114/53, pulse 73, temperature 97.9 F (36.6 C), temperature source Oral, resp. rate 18, height 5\' 4"  (1.626 m), weight 253 lb (114.8 kg), last menstrual period 11/23/2017, SpO2 96 %. General appearance: alert and cooperative GI: soft NT Incision/Wound: well-approximated and clear  Disposition: 01-Home or Self Care  Discharge Instructions    Call MD for:  persistant nausea and vomiting   Complete by:  As directed    Call MD for:  redness, tenderness, or signs of infection (pain, swelling, redness, odor or green/yellow discharge around incision site)   Complete by:  As directed    Call MD for:  severe uncontrolled pain   Complete by:  As directed    Call MD for:  temperature >100.4   Complete by:  As directed    Diet - low sodium heart healthy   Complete by:  As directed    Discharge instructions   Complete by:  As directed    Avoid driving for at least 1-2 weeks or until off narcotic pain meds.  No heavy lifting greater than 10 lbs.  Nothing in vagina for 6 weeks.  May remove bandage in 1-2 days.  Shower over incisions and pat dry.   Increase activity slowly   Complete by:  As directed      Allergies as of 12/07/2017      Reactions   Peanut-containing Drug Products Anaphylaxis   Hydromorphone Itching   Morphine And Related  Itching   Sulfa Antibiotics Rash      Medication List    TAKE these medications   albuterol 108 (90 Base) MCG/ACT inhaler Commonly known as:  PROVENTIL HFA;VENTOLIN HFA Inhale 2 puffs into the lungs every 6 (six) hours as needed for wheezing or shortness of breath.   BYSTOLIC 10 MG tablet Generic drug:  nebivolol TAKE 1 TABLET (10 MG TOTAL) BY MOUTH EVERY MORNING.   cetirizine 10 MG tablet Commonly known as:  ZYRTEC Take 10 mg by mouth daily.   clobetasol cream 0.05 % Commonly known as:  TEMOVATE Apply 1 application topically as needed (HAND).   EPINEPHrine 0.3 mg/0.3 mL Soaj injection Commonly known as:  EPI-PEN Inject 0.3 mLs (0.3 mg total) into the muscle once.   furosemide 20 MG tablet Commonly known as:  LASIX TAKE 1 TABLET (20 MG TOTAL) BY MOUTH DAILY. What changed:    when to take this  reasons to take this   HYDROcodone-acetaminophen 5-325 MG tablet Commonly known as:  NORCO/VICODIN Take 1-2 tablets by mouth every 4 (four) hours as needed for moderate pain.   ibuprofen 600 MG tablet Commonly known as:  ADVIL,MOTRIN Take 1 tablet (600 mg total) by mouth every 6 (six) hours as needed (mild pain).   IRON  PO Take 1 tablet by mouth every other day.   ketorolac 0.5 % ophthalmic solution Commonly known as:  ACULAR Place 1 drop into the right eye 4 (four) times daily.   Lorcaserin HCl ER 20 MG Tb24 Commonly known as:  BELVIQ XR Take 20 mg by mouth daily.   methylphenidate 36 MG CR tablet Commonly known as:  CONCERTA Take 1 tablet (36 mg total) by mouth daily. What changed:  Another medication with the same name was changed. Make sure you understand how and when to take each.   methylphenidate 20 MG tablet Commonly known as:  RITALIN Take 1 tablet (20 mg total) by mouth daily as needed. What changed:  reasons to take this   pantoprazole 40 MG tablet Commonly known as:  PROTONIX TAKE 1 TABLET BY MOUTH TWO TIMES DAILY   Vitamin D (Ergocalciferol)  50000 units Caps capsule Commonly known as:  DRISDOL Take 1 capsule (50,000 Units total) by mouth every 7 (seven) days. What changed:  additional instructions   zolpidem 10 MG tablet Commonly known as:  AMBIEN Take 1 tablet (10 mg total) by mouth at bedtime as needed. for sleep      Follow-up Information    Huel Cote, MD. Schedule an appointment as soon as possible for a visit in 2 week(s).   Specialty:  Obstetrics and Gynecology Why:  incison check Contact information: [address] [place] [address] [phone]           Signed: Oliver Pila 12/07/2017, 8:51 AM

## 2017-12-12 ENCOUNTER — Inpatient Hospital Stay (HOSPITAL_COMMUNITY): Payer: BLUE CROSS/BLUE SHIELD

## 2017-12-12 ENCOUNTER — Other Ambulatory Visit: Payer: Self-pay

## 2017-12-12 ENCOUNTER — Inpatient Hospital Stay (HOSPITAL_COMMUNITY)
Admission: AD | Admit: 2017-12-12 | Discharge: 2017-12-14 | DRG: 863 | Disposition: A | Discharge status: Other Acute Inpatient Hospital | Payer: BLUE CROSS/BLUE SHIELD | Source: Ambulatory Visit | Attending: Obstetrics and Gynecology | Admitting: Obstetrics and Gynecology

## 2017-12-12 ENCOUNTER — Encounter (HOSPITAL_COMMUNITY): Payer: Self-pay | Admitting: *Deleted

## 2017-12-12 DIAGNOSIS — B9689 Other specified bacterial agents as the cause of diseases classified elsewhere: Secondary | ICD-10-CM | POA: Diagnosis not present

## 2017-12-12 DIAGNOSIS — N9989 Other postprocedural complications and disorders of genitourinary system: Secondary | ICD-10-CM | POA: Diagnosis not present

## 2017-12-12 DIAGNOSIS — K7689 Other specified diseases of liver: Secondary | ICD-10-CM | POA: Diagnosis present

## 2017-12-12 DIAGNOSIS — N739 Female pelvic inflammatory disease, unspecified: Secondary | ICD-10-CM

## 2017-12-12 DIAGNOSIS — R102 Pelvic and perineal pain: Secondary | ICD-10-CM | POA: Diagnosis not present

## 2017-12-12 DIAGNOSIS — R109 Unspecified abdominal pain: Secondary | ICD-10-CM

## 2017-12-12 DIAGNOSIS — T8141XA Infection following a procedure, superficial incisional surgical site, initial encounter: Secondary | ICD-10-CM | POA: Diagnosis not present

## 2017-12-12 DIAGNOSIS — Z882 Allergy status to sulfonamides status: Secondary | ICD-10-CM | POA: Diagnosis not present

## 2017-12-12 DIAGNOSIS — N281 Cyst of kidney, acquired: Secondary | ICD-10-CM | POA: Diagnosis not present

## 2017-12-12 DIAGNOSIS — N9981 Other intraoperative complications of genitourinary system: Secondary | ICD-10-CM | POA: Diagnosis not present

## 2017-12-12 DIAGNOSIS — G8918 Other acute postprocedural pain: Secondary | ICD-10-CM

## 2017-12-12 DIAGNOSIS — N132 Hydronephrosis with renal and ureteral calculous obstruction: Secondary | ICD-10-CM | POA: Diagnosis not present

## 2017-12-12 DIAGNOSIS — R5082 Postprocedural fever: Secondary | ICD-10-CM | POA: Diagnosis not present

## 2017-12-12 DIAGNOSIS — N764 Abscess of vulva: Secondary | ICD-10-CM | POA: Diagnosis not present

## 2017-12-12 DIAGNOSIS — I10 Essential (primary) hypertension: Secondary | ICD-10-CM | POA: Diagnosis not present

## 2017-12-12 DIAGNOSIS — R1084 Generalized abdominal pain: Secondary | ICD-10-CM | POA: Diagnosis not present

## 2017-12-12 DIAGNOSIS — Z885 Allergy status to narcotic agent status: Secondary | ICD-10-CM | POA: Diagnosis not present

## 2017-12-12 DIAGNOSIS — N760 Acute vaginitis: Secondary | ICD-10-CM | POA: Diagnosis not present

## 2017-12-12 DIAGNOSIS — Z91010 Allergy to peanuts: Secondary | ICD-10-CM | POA: Diagnosis not present

## 2017-12-12 LAB — CBC
HCT: 30.5 % — ABNORMAL LOW (ref 36.0–46.0)
HEMOGLOBIN: 9.9 g/dL — AB (ref 12.0–15.0)
MCH: 28.3 pg (ref 26.0–34.0)
MCHC: 32.5 g/dL (ref 30.0–36.0)
MCV: 87.1 fL (ref 78.0–100.0)
Platelets: 319 10*3/uL (ref 150–400)
RBC: 3.5 MIL/uL — AB (ref 3.87–5.11)
RDW: 15.5 % (ref 11.5–15.5)
WBC: 13.1 10*3/uL — AB (ref 4.0–10.5)

## 2017-12-12 LAB — COMPREHENSIVE METABOLIC PANEL
ALBUMIN: 3.5 g/dL (ref 3.5–5.0)
ALT: 21 U/L (ref 14–54)
AST: 19 U/L (ref 15–41)
Alkaline Phosphatase: 83 U/L (ref 38–126)
Anion gap: 10 (ref 5–15)
BUN: 7 mg/dL (ref 6–20)
CHLORIDE: 103 mmol/L (ref 101–111)
CO2: 22 mmol/L (ref 22–32)
Calcium: 8.9 mg/dL (ref 8.9–10.3)
Creatinine, Ser: 0.82 mg/dL (ref 0.44–1.00)
GFR calc Af Amer: >60 mL/min (ref >60)
GFR calc non Af Amer: >60 mL/min (ref >60)
Glucose, Bld: 101 mg/dL — ABNORMAL HIGH (ref 65–99)
POTASSIUM: 4.1 mmol/L (ref 3.5–5.1)
SODIUM: 135 mmol/L (ref 135–145)
Total Bilirubin: 0.8 mg/dL (ref 0.3–1.2)
Total Protein: 7 g/dL (ref 6.5–8.1)

## 2017-12-12 LAB — CBC WITH DIFFERENTIAL/PLATELET
BASOS ABS: 0 10*3/uL (ref 0.0–0.1)
BASOS PCT: 0 %
EOS ABS: 0.1 10*3/uL (ref 0.0–0.7)
EOS PCT: 1 %
HCT: 31.9 % — ABNORMAL LOW (ref 36.0–46.0)
Hemoglobin: 10.2 g/dL — ABNORMAL LOW (ref 12.0–15.0)
Lymphocytes Relative: 15 %
Lymphs Abs: 2 10*3/uL (ref 0.7–4.0)
MCH: 27.9 pg (ref 26.0–34.0)
MCHC: 32 g/dL (ref 30.0–36.0)
MCV: 87.4 fL (ref 78.0–100.0)
MONO ABS: 0.5 10*3/uL (ref 0.1–1.0)
Monocytes Relative: 4 %
Neutro Abs: 10.5 10*3/uL — ABNORMAL HIGH (ref 1.7–7.7)
Neutrophils Relative %: 80 %
PLATELETS: 309 10*3/uL (ref 150–400)
RBC: 3.65 MIL/uL — ABNORMAL LOW (ref 3.87–5.11)
RDW: 15.4 % (ref 11.5–15.5)
WBC: 13.2 10*3/uL — ABNORMAL HIGH (ref 4.0–10.5)

## 2017-12-12 LAB — URINALYSIS, ROUTINE W REFLEX MICROSCOPIC
Bilirubin Urine: NEGATIVE
Glucose, UA: NEGATIVE mg/dL
Hgb urine dipstick: NEGATIVE
Ketones, ur: NEGATIVE mg/dL
Leukocytes, UA: NEGATIVE
Nitrite: NEGATIVE
Protein, ur: NEGATIVE mg/dL
SPECIFIC GRAVITY, URINE: 1.005 (ref 1.005–1.030)
pH: 7 (ref 5.0–8.0)

## 2017-12-12 LAB — CREATININE, SERUM
CREATININE: 0.9 mg/dL (ref 0.44–1.00)
GFR calc non Af Amer: >60 mL/min (ref >60)

## 2017-12-12 MED ORDER — ZOLPIDEM TARTRATE 5 MG PO TABS
5.0000 mg | ORAL_TABLET | Freq: Every evening | ORAL | Status: DC | PRN
  Administered 2017-12-13 – 2017-12-14 (×2): 5 mg via ORAL
  Filled 2017-12-12 (×2): qty 1

## 2017-12-12 MED ORDER — SODIUM CHLORIDE 0.9% FLUSH: 3.0000 mL | INTRAVENOUS | Status: DC | PRN

## 2017-12-12 MED ORDER — SIMETHICONE 80 MG PO CHEW
80.0000 mg | CHEWABLE_TABLET | Freq: Four times a day (QID) | ORAL | Status: DC | PRN
Start: 2017-12-12 — End: 2017-12-14

## 2017-12-12 MED ORDER — ENOXAPARIN SODIUM 60 MG/0.6ML ~~LOC~~ SOLN
60.0000 mg | SUBCUTANEOUS | Status: DC
  Administered 2017-12-12 – 2017-12-13 (×2): 60 mg via SUBCUTANEOUS
  Filled 2017-12-12 (×2): qty 0.6

## 2017-12-12 MED ORDER — SODIUM CHLORIDE 0.9 % IV SOLN
3.0000 g | Freq: Four times a day (QID) | INTRAVENOUS | Status: DC
  Administered 2017-12-12 – 2017-12-14 (×7): 3 g via INTRAVENOUS
  Filled 2017-12-12 (×9): qty 3

## 2017-12-12 MED ORDER — LACTATED RINGERS IV SOLN
INTRAVENOUS | Status: DC
  Administered 2017-12-13 – 2017-12-14 (×2): via INTRAVENOUS

## 2017-12-12 MED ORDER — BUTALBITAL-APAP-CAFFEINE 50-325-40 MG PO TABS
1.0000 | ORAL_TABLET | Freq: Four times a day (QID) | ORAL | Status: DC | PRN
  Administered 2017-12-12: 1 via ORAL
  Filled 2017-12-12: qty 1

## 2017-12-12 MED ORDER — METHYLPHENIDATE HCL ER (OSM) 36 MG PO TBCR: 36.0000 mg | EXTENDED_RELEASE_TABLET | Freq: Every day | ORAL | Status: DC

## 2017-12-12 MED ORDER — ALBUTEROL SULFATE (2.5 MG/3ML) 0.083% IN NEBU: 3.0000 mL | INHALATION_SOLUTION | Freq: Four times a day (QID) | RESPIRATORY_TRACT | Status: DC | PRN

## 2017-12-12 MED ORDER — OXYCODONE-ACETAMINOPHEN 5-325 MG PO TABS
1.0000 | ORAL_TABLET | ORAL | Status: DC | PRN
  Administered 2017-12-13: 1 via ORAL
  Filled 2017-12-12: qty 1

## 2017-12-12 MED ORDER — PANTOPRAZOLE SODIUM 40 MG PO TBEC
40.0000 mg | DELAYED_RELEASE_TABLET | Freq: Two times a day (BID) | ORAL | Status: DC
  Administered 2017-12-12 – 2017-12-14 (×4): 40 mg via ORAL
  Filled 2017-12-12 (×5): qty 1

## 2017-12-12 MED ORDER — SODIUM CHLORIDE 0.9 % IV SOLN: 250.0000 mL | INTRAVENOUS | Status: DC | PRN

## 2017-12-12 MED ORDER — LORCASERIN HCL ER 20 MG PO TB24: 20.0000 mg | ORAL_TABLET | Freq: Every day | ORAL | Status: DC

## 2017-12-12 MED ORDER — PRENATAL MULTIVITAMIN CH
1.0000 | ORAL_TABLET | Freq: Every day | ORAL | Status: DC
  Administered 2017-12-13: 1 via ORAL
  Filled 2017-12-12: qty 1

## 2017-12-12 MED ORDER — NEBIVOLOL HCL 10 MG PO TABS
10.0000 mg | ORAL_TABLET | Freq: Every morning | ORAL | Status: DC
  Administered 2017-12-13 – 2017-12-14 (×2): 10 mg via ORAL
  Filled 2017-12-12 (×3): qty 1

## 2017-12-12 MED ORDER — DOCUSATE SODIUM 100 MG PO CAPS
100.0000 mg | ORAL_CAPSULE | Freq: Two times a day (BID) | ORAL | Status: DC
  Administered 2017-12-12 – 2017-12-14 (×4): 100 mg via ORAL
  Filled 2017-12-12 (×4): qty 1

## 2017-12-12 MED ORDER — KETOROLAC TROMETHAMINE 0.5 % OP SOLN
1.0000 [drp] | Freq: Four times a day (QID) | OPHTHALMIC | Status: DC
  Filled 2017-12-12: qty 3

## 2017-12-12 MED ORDER — SODIUM CHLORIDE 0.9% FLUSH
3.0000 mL | Freq: Two times a day (BID) | INTRAVENOUS | Status: DC
  Administered 2017-12-12: 3 mL via INTRAVENOUS

## 2017-12-12 MED ORDER — ACETAMINOPHEN 325 MG PO TABS
650.0000 mg | ORAL_TABLET | ORAL | Status: DC | PRN
  Administered 2017-12-13 (×4): 650 mg via ORAL
  Filled 2017-12-12 (×4): qty 2

## 2017-12-12 NOTE — MAU Note (Signed)
Urine in lab 

## 2017-12-12 NOTE — H&P (Signed)
Ashley Bradford is an 47 y.o. female s/p TLH/BS on 12/06/17 who presents with onset of fever and constipation.  Pt states went home 12/07/17 and did well until the night of 12/09/17 when started having some chills.  She had normal daily BM's until that same night and felt impacted so did an enema this AM.  Still felt feverish and chills with temp at home of 100.3.  She denies N/V or any pain, has not been on any pain meds.  She has noted some urinary frequency and burning. She also c/o significant HA, but no respiratory sx or congestion.  BP is elevated, but has not taken her BP meds at home because was worried she wa still running low.   Pertinent Gynecological History: Menorrhagia Dysmenorrhea  Past OBHx-C-section x 2  Menstrual History:  Patient's last menstrual period was 11/23/2017 (approximate).    Past Medical History:  Diagnosis Date  . Anemia   . Asthma    EXERCISE INDUCED  . Family history of adverse reaction to anesthesia    HARD TO WAKE UP  . GERD (gastroesophageal reflux disease)   . Headache    MIGRAINES  . History of kidney stones   . Hypertension   . Left ureteral calculus   . Urgency of urination     Past Surgical History:  Procedure Laterality Date  . ABDOMINOPLASTY/PANNICULECTOMY  2004  . BREAST REDUCTION SURGERY  2005  . CESAREAN SECTION  07-02-2001 &   1997   W/  BILATERAL TUBAL LIGATION  . NEPHROLITHOTOMY Right 1996  . TOTAL LAPAROSCOPIC HYSTERECTOMY WITH SALPINGECTOMY Bilateral 12/06/2017   Procedure: TOTAL LAPAROSCOPIC HYSTERECTOMY WITH SALPINGECTOMY;  Surgeon: Huel Cote, MD;  Location: [facility];  Service: Gynecology;  Laterality: Bilateral;  MD request extra OR time. 2.5hrs total OR time    Family History  Problem Relation Age of Onset  . Hypertension Mother   . Hypertension Father     Social History:  reports that  has never smoked. she has never used smokeless tobacco. She reports that she drinks alcohol. She reports that she does not use  drugs.  Allergies:  Allergies  Allergen Reactions  . Peanut-Containing Drug Products Anaphylaxis  . Hydromorphone Itching  . Morphine And Related Itching  . Sulfa Antibiotics Rash    Medications Prior to Admission  Medication Sig Dispense Refill Last Dose  . albuterol (PROVENTIL HFA;VENTOLIN HFA) 108 (90 Base) MCG/ACT inhaler Inhale 2 puffs into the lungs every 6 (six) hours as needed for wheezing or shortness of breath. 1 Inhaler 1 More than a month at Unknown time  . BYSTOLIC 10 MG tablet TAKE 1 TABLET (10 MG TOTAL) BY MOUTH EVERY MORNING. 90 tablet 1 12/06/2017 at 0545  . cetirizine (ZYRTEC) 10 MG tablet Take 10 mg by mouth daily.   12/06/2017 at 0515  . clobetasol cream (TEMOVATE) 0.05 % Apply 1 application topically as needed (HAND). 60 g 3 More than a month at Unknown time  . EPINEPHrine 0.3 mg/0.3 mL IJ SOAJ injection Inject 0.3 mLs (0.3 mg total) into the muscle once. 2 Device 1 Taking  . furosemide (LASIX) 20 MG tablet TAKE 1 TABLET (20 MG TOTAL) BY MOUTH DAILY. (Patient taking differently: Take 20 mg by mouth daily as needed for fluid or edema. ) 90 tablet 1 Unknown at Unknown time  . HYDROcodone-acetaminophen (NORCO/VICODIN) 5-325 MG tablet Take 1-2 tablets by mouth every 4 (four) hours as needed for moderate pain. 30 tablet 0   . ibuprofen (ADVIL,MOTRIN) 600 MG  tablet Take 1 tablet (600 mg total) by mouth every 6 (six) hours as needed (mild pain). 30 tablet 0   . IRON PO Take 1 tablet by mouth every other day.   Past Week at Unknown time  . ketorolac (ACULAR) 0.5 % ophthalmic solution Place 1 drop into the right eye 4 (four) times daily. 5 mL 0   . Lorcaserin HCl ER (BELVIQ XR) 20 MG TB24 Take 20 mg by mouth daily. 30 tablet 2 12/05/2017 at Unknown time  . methylphenidate (RITALIN) 20 MG tablet Take 1 tablet (20 mg total) by mouth daily as needed. (Patient taking differently: Take 20 mg by mouth daily as needed (for add). ) 20 tablet 0 More than a month at Unknown time  .  methylphenidate 36 MG PO CR tablet Take 1 tablet (36 mg total) by mouth daily. 30 tablet 0 12/05/2017 at Unknown time  . pantoprazole (PROTONIX) 40 MG tablet TAKE 1 TABLET BY MOUTH TWO TIMES DAILY 180 tablet 1 12/06/2017 at 0515  . Vitamin D, Ergocalciferol, (DRISDOL) 50000 units CAPS capsule Take 1 capsule (50,000 Units total) by mouth every 7 (seven) days. (Patient taking differently: Take 50,000 Units by mouth every 7 (seven) days. saturdays) 12 capsule 3 Past Week at Unknown time  . zolpidem (AMBIEN) 10 MG tablet Take 1 tablet (10 mg total) by mouth at bedtime as needed. for sleep 30 tablet 0 Past Week at Unknown time    Review of Systems  Constitutional: Positive for chills and fever.  Gastrointestinal: Positive for constipation.    Blood pressure (!) 156/86, pulse 96, temperature 99.2 F (37.3 C), temperature source Oral, resp. rate 18, last menstrual period 11/23/2017, SpO2 99 %. Physical Exam  Constitutional: She appears well-developed.  Cardiovascular: Normal rate.  Respiratory: Effort normal.  GI: Soft.  Neurological: She is alert.  Psychiatric: She has a normal mood and affect.    Results for orders placed or performed during the hospital encounter of 12/12/17 (from the past 24 hour(s))  Urinalysis, Routine w reflex microscopic     Status: Abnormal   Collection Time: 12/12/17  2:22 PM  Result Value Ref Range   Color, Urine STRAW (A) YELLOW   APPearance CLEAR CLEAR   Specific Gravity, Urine 1.005 1.005 - 1.030   pH 7.0 5.0 - 8.0   Glucose, UA NEGATIVE NEGATIVE mg/dL   Hgb urine dipstick NEGATIVE NEGATIVE   Bilirubin Urine NEGATIVE NEGATIVE   Ketones, ur NEGATIVE NEGATIVE mg/dL   Protein, ur NEGATIVE NEGATIVE mg/dL   Nitrite NEGATIVE NEGATIVE   Leukocytes, UA NEGATIVE NEGATIVE  CBC with Differential/Platelet     Status: Abnormal   Collection Time: 12/12/17  3:06 PM  Result Value Ref Range   WBC 13.2 (H) 4.0 - 10.5 K/uL   RBC 3.65 (L) 3.87 - 5.11 MIL/uL   Hemoglobin  10.2 (L) 12.0 - 15.0 g/dL   HCT 16.131.9 (L) 09.636.0 - 04.546.0 %   MCV 87.4 78.0 - 100.0 fL   MCH 27.9 26.0 - 34.0 pg   MCHC 32.0 30.0 - 36.0 g/dL   RDW 40.915.4 81.111.5 - 91.415.5 %   Platelets 309 150 - 400 K/uL   Neutrophils Relative % 80 %   Neutro Abs 10.5 (H) 1.7 - 7.7 K/uL   Lymphocytes Relative 15 %   Lymphs Abs 2.0 0.7 - 4.0 K/uL   Monocytes Relative 4 %   Monocytes Absolute 0.5 0.1 - 1.0 K/uL   Eosinophils Relative 1 %   Eosinophils Absolute 0.1 0.0 -  0.7 K/uL   Basophils Relative 0 %   Basophils Absolute 0.0 0.0 - 0.1 K/uL  Comprehensive metabolic panel     Status: Abnormal   Collection Time: 12/12/17  3:06 PM  Result Value Ref Range   Sodium 135 135 - 145 mmol/L   Potassium 4.1 3.5 - 5.1 mmol/L   Chloride 103 101 - 111 mmol/L   CO2 22 22 - 32 mmol/L   Glucose, Bld 101 (H) 65 - 99 mg/dL   BUN 7 6 - 20 mg/dL   Creatinine, Ser 1.61 0.44 - 1.00 mg/dL   Calcium 8.9 8.9 - 09.6 mg/dL   Total Protein 7.0 6.5 - 8.1 g/dL   Albumin 3.5 3.5 - 5.0 g/dL   AST 19 15 - 41 U/L   ALT 21 14 - 54 U/L   Alkaline Phosphatase 83 38 - 126 U/L   Total Bilirubin 0.8 0.3 - 1.2 mg/dL   GFR calc non Af Amer >60 >60 mL/min   GFR calc Af Amer >60 >60 mL/min   Anion gap 10 5 - 15    Dg Abd 1 View  Result Date: 12/12/2017 CLINICAL DATA:  Diffuse lower abdominal pain since recent hysterectomy EXAM: ABDOMEN - 1 VIEW COMPARISON:  03/02/2015 FINDINGS: Nonobstructive bowel gas pattern. No evidence of free air under the diaphragm on the upright view. Visualized osseous structures are within normal limits. IMPRESSION: No evidence of small bowel obstruction or free air. Electronically Signed   By: Charline Bills M.D.   On: 12/12/2017 16:06    Assessment/Plan: Pt admitted to observation for postoperative fever and symptoms of chills, body aches.  Her blood work is relatively normal except for a slightly elevated WBC of 13.2.  Abdominal film with no free air or obstructive pattern to gas.  Urine dipstick unremarkable  but will send for culture as patient has some urinary symptoms and place on empiric antibiotics.  D/w pt if symptoms and temperature do not improve with antibiotics will proceed with imaging abdomen and pelvis with CT scan.    Oliver Pila 12/12/2017, 5:49 PM

## 2017-12-12 NOTE — MAU Provider Note (Signed)
Pt seen and examined in MAU C/o headache. Hasn't eaten all day because worried about "impaction". Had a large BM earlier today. Had chills earlier as well. +flatus, voiding copiously ( per pt). No bloating  Gen: anxious, no acute distress ABD: soft, ND, NTTP, no guarding,                      incisions c/d/i  EXT - +pedal pulses b/l, no edema  Plan per H/P: will obtain urine culture prior to starting antibx   - Advise pt to eat dinner - may help HA   - Pain control prn for HA

## 2017-12-12 NOTE — MAU Provider Note (Signed)
History     CSN: [id number]  Arrival date and time: 12/12/17 1402   First Provider Initiated Contact with Patient 12/12/17 1452      Chief Complaint  Patient presents with  . Headache  . Fever  . Generalized Body Aches  . Abdominal Pain   47 y.o. female 6 days post-op from TLH/BS here with LAP, body aches, chills, and malaise. Abd pain in bilateral in lower abdomen. Sx started 3 days ago. She became "impacted" today and did a fleet enema which yielded a large BM but began feeling worse after. She endorses dysuria that started today. No other urinary sx. Having some low back pain but this started before her surgery. She had a fever of 100.3 this am. Denies VB or vaginal discharge. No respiratory sx. Endorses frontal HA that started today. Has not taken anything for it.   Past Medical History:  Diagnosis Date  . Anemia   . Asthma    EXERCISE INDUCED  . Family history of adverse reaction to anesthesia    HARD TO WAKE UP  . GERD (gastroesophageal reflux disease)   . Headache    MIGRAINES  . History of kidney stones   . Hypertension   . Left ureteral calculus   . Urgency of urination     Past Surgical History:  Procedure Laterality Date  . ABDOMINOPLASTY/PANNICULECTOMY  2004  . BREAST REDUCTION SURGERY  2005  . CESAREAN SECTION  07-02-2001 &   1997   W/  BILATERAL TUBAL LIGATION  . NEPHROLITHOTOMY Right 1996  . TOTAL LAPAROSCOPIC HYSTERECTOMY WITH SALPINGECTOMY Bilateral 12/06/2017   Procedure: TOTAL LAPAROSCOPIC HYSTERECTOMY WITH SALPINGECTOMY;  Surgeon: Huel Cote, MD;  Location: [facility];  Service: Gynecology;  Laterality: Bilateral;  MD request extra OR time. 2.5hrs total OR time    Family History  Problem Relation Age of Onset  . Hypertension Mother   . Hypertension Father     Social History   Tobacco Use  . Smoking status: Never Smoker  . Smokeless tobacco: Never Used  Substance Use Topics  . Alcohol use: Yes    Comment: occasionally  . Drug use: No      Allergies:  Allergies  Allergen Reactions  . Peanut-Containing Drug Products Anaphylaxis  . Hydromorphone Itching  . Morphine And Related Itching  . Sulfa Antibiotics Rash    Medications Prior to Admission  Medication Sig Dispense Refill Last Dose  . albuterol (PROVENTIL HFA;VENTOLIN HFA) 108 (90 Base) MCG/ACT inhaler Inhale 2 puffs into the lungs every 6 (six) hours as needed for wheezing or shortness of breath. 1 Inhaler 1 More than a month at Unknown time  . BYSTOLIC 10 MG tablet TAKE 1 TABLET (10 MG TOTAL) BY MOUTH EVERY MORNING. 90 tablet 1 12/06/2017 at 0545  . cetirizine (ZYRTEC) 10 MG tablet Take 10 mg by mouth daily.   12/06/2017 at 0515  . clobetasol cream (TEMOVATE) 0.05 % Apply 1 application topically as needed (HAND). 60 g 3 More than a month at Unknown time  . EPINEPHrine 0.3 mg/0.3 mL IJ SOAJ injection Inject 0.3 mLs (0.3 mg total) into the muscle once. 2 Device 1 Taking  . furosemide (LASIX) 20 MG tablet TAKE 1 TABLET (20 MG TOTAL) BY MOUTH DAILY. (Patient taking differently: Take 20 mg by mouth daily as needed for fluid or edema. ) 90 tablet 1 Unknown at Unknown time  . HYDROcodone-acetaminophen (NORCO/VICODIN) 5-325 MG tablet Take 1-2 tablets by mouth every 4 (four) hours as needed for moderate pain.  30 tablet 0   . ibuprofen (ADVIL,MOTRIN) 600 MG tablet Take 1 tablet (600 mg total) by mouth every 6 (six) hours as needed (mild pain). 30 tablet 0   . IRON PO Take 1 tablet by mouth every other day.   Past Week at Unknown time  . ketorolac (ACULAR) 0.5 % ophthalmic solution Place 1 drop into the right eye 4 (four) times daily. 5 mL 0   . Lorcaserin HCl ER (BELVIQ XR) 20 MG TB24 Take 20 mg by mouth daily. 30 tablet 2 12/05/2017 at Unknown time  . methylphenidate (RITALIN) 20 MG tablet Take 1 tablet (20 mg total) by mouth daily as needed. (Patient taking differently: Take 20 mg by mouth daily as needed (for add). ) 20 tablet 0 More than a month at Unknown time  .  methylphenidate 36 MG PO CR tablet Take 1 tablet (36 mg total) by mouth daily. 30 tablet 0 12/05/2017 at Unknown time  . pantoprazole (PROTONIX) 40 MG tablet TAKE 1 TABLET BY MOUTH TWO TIMES DAILY 180 tablet 1 12/06/2017 at 0515  . Vitamin D, Ergocalciferol, (DRISDOL) 50000 units CAPS capsule Take 1 capsule (50,000 Units total) by mouth every 7 (seven) days. (Patient taking differently: Take 50,000 Units by mouth every 7 (seven) days. saturdays) 12 capsule 3 Past Week at Unknown time  . zolpidem (AMBIEN) 10 MG tablet Take 1 tablet (10 mg total) by mouth at bedtime as needed. for sleep 30 tablet 0 Past Week at Unknown time    Review of Systems  Constitutional: Positive for chills and fever.  HENT: Negative for sore throat.   Respiratory: Negative for cough.   Gastrointestinal: Positive for abdominal pain and constipation. Negative for diarrhea, nausea and vomiting.  Genitourinary: Positive for dysuria. Negative for frequency, hematuria, urgency, vaginal bleeding and vaginal discharge.   Physical Exam   Blood pressure (!) 156/86, pulse 96, temperature 99.2 F (37.3 C), temperature source Oral, resp. rate 18, last menstrual period 11/23/2017, SpO2 99 %.  Physical Exam  Nursing note and vitals reviewed. Constitutional: She is oriented to person, place, and time. She appears well-developed and well-nourished. No distress.  HENT:  Head: Normocephalic and atraumatic.  Neck: Normal range of motion.  Cardiovascular: Normal rate, regular rhythm and normal heart sounds.  Respiratory: Effort normal and breath sounds normal. No respiratory distress. She has no wheezes. She has no rales.  GI: Soft. Bowel sounds are normal. She exhibits no distension and no mass. There is tenderness in the right lower quadrant, suprapubic area and left lower quadrant. There is no rebound, no guarding and no CVA tenderness.  Genitourinary:  Genitourinary Comments: External: no lesions or erythema Vagina: rugated, pink,  moist, scant brown discharge, cuff intact   Musculoskeletal: Normal range of motion.       Cervical back: Normal.       Thoracic back: Normal.       Lumbar back: She exhibits tenderness. She exhibits no deformity. Bony tenderness: bilateral.  Neurological: She is alert and oriented to person, place, and time.  Skin: Skin is warm and dry.     Psychiatric: She has a normal mood and affect.   Results for orders placed or performed during the hospital encounter of 12/12/17 (from the past 24 hour(s))  Urinalysis, Routine w reflex microscopic     Status: Abnormal   Collection Time: 12/12/17  2:22 PM  Result Value Ref Range   Color, Urine STRAW (A) YELLOW   APPearance CLEAR CLEAR   Specific Gravity,  Urine 1.005 1.005 - 1.030   pH 7.0 5.0 - 8.0   Glucose, UA NEGATIVE NEGATIVE mg/dL   Hgb urine dipstick NEGATIVE NEGATIVE   Bilirubin Urine NEGATIVE NEGATIVE   Ketones, ur NEGATIVE NEGATIVE mg/dL   Protein, ur NEGATIVE NEGATIVE mg/dL   Nitrite NEGATIVE NEGATIVE   Leukocytes, UA NEGATIVE NEGATIVE  CBC with Differential/Platelet     Status: Abnormal   Collection Time: 12/12/17  3:06 PM  Result Value Ref Range   WBC 13.2 (H) 4.0 - 10.5 K/uL   RBC 3.65 (L) 3.87 - 5.11 MIL/uL   Hemoglobin 10.2 (L) 12.0 - 15.0 g/dL   HCT 16.131.9 (L) 09.636.0 - 04.546.0 %   MCV 87.4 78.0 - 100.0 fL   MCH 27.9 26.0 - 34.0 pg   MCHC 32.0 30.0 - 36.0 g/dL   RDW 40.915.4 81.111.5 - 91.415.5 %   Platelets 309 150 - 400 K/uL   Neutrophils Relative % 80 %   Neutro Abs 10.5 (H) 1.7 - 7.7 K/uL   Lymphocytes Relative 15 %   Lymphs Abs 2.0 0.7 - 4.0 K/uL   Monocytes Relative 4 %   Monocytes Absolute 0.5 0.1 - 1.0 K/uL   Eosinophils Relative 1 %   Eosinophils Absolute 0.1 0.0 - 0.7 K/uL   Basophils Relative 0 %   Basophils Absolute 0.0 0.0 - 0.1 K/uL   Dg Abd 1 View  Result Date: 12/12/2017 CLINICAL DATA:  Diffuse lower abdominal pain since recent hysterectomy EXAM: ABDOMEN - 1 VIEW COMPARISON:  03/02/2015 FINDINGS: Nonobstructive  bowel gas pattern. No evidence of free air under the diaphragm on the upright view. Visualized osseous structures are within normal limits. IMPRESSION: No evidence of small bowel obstruction or free air. Electronically Signed   By: Charline BillsSriyesh  Krishnan M.D.   On: 12/12/2017 16:06   MAU Course  Procedures Fioricet  MDM Labs and KUB ordered. Minimal relief from HA. No evidence of acute abdominal process. Consult with Dr. Senaida Oresichardson. Plan to admit for obs.  Assessment and Plan   1. Post-op pain   2. Abdominal pain    Admit to [facility] per Dr. Kathi Simpersichardson   Sabriyah Wilcher, CNM 12/12/2017, 3:16 PM

## 2017-12-12 NOTE — Progress Notes (Signed)
Reported temperature and abdominal pain to Dr.Banga. Verbal order given for fluids and percocet.

## 2017-12-12 NOTE — Progress Notes (Signed)
Spoke with Lottie DawsonAnne Mason pharmacist about running LR with [facility]. Pharmacist okay to run together.

## 2017-12-12 NOTE — MAU Note (Signed)
Had a hysterectomy on Thurs.  On Sunday, started aching like crazy - hot and cold. Last night became impacted, did enemas, still feels sick, has a HA, fever 100.3, just doesn't feel right.

## 2017-12-13 ENCOUNTER — Inpatient Hospital Stay (HOSPITAL_COMMUNITY): Payer: BLUE CROSS/BLUE SHIELD

## 2017-12-13 ENCOUNTER — Encounter (HOSPITAL_COMMUNITY): Payer: Self-pay | Admitting: Radiology

## 2017-12-13 LAB — BASIC METABOLIC PANEL
ANION GAP: 13 (ref 5–15)
BUN: 7 mg/dL (ref 6–20)
CO2: 20 mmol/L — ABNORMAL LOW (ref 22–32)
Calcium: 8.7 mg/dL — ABNORMAL LOW (ref 8.9–10.3)
Chloride: 104 mmol/L (ref 101–111)
Creatinine, Ser: 0.78 mg/dL (ref 0.44–1.00)
GFR calc non Af Amer: >60 mL/min (ref >60)
Glucose, Bld: 91 mg/dL (ref 65–99)
POTASSIUM: 3.5 mmol/L (ref 3.5–5.1)
SODIUM: 137 mmol/L (ref 135–145)

## 2017-12-13 LAB — CBC WITH DIFFERENTIAL/PLATELET
BASOS ABS: 0 10*3/uL (ref 0.0–0.1)
Basophils Relative: 0 %
Eosinophils Absolute: 0.2 10*3/uL (ref 0.0–0.7)
Eosinophils Relative: 2 %
HCT: 32 % — ABNORMAL LOW (ref 36.0–46.0)
Hemoglobin: 10.4 g/dL — ABNORMAL LOW (ref 12.0–15.0)
LYMPHS PCT: 21 %
Lymphs Abs: 2.4 10*3/uL (ref 0.7–4.0)
MCH: 28 pg (ref 26.0–34.0)
MCHC: 32.5 g/dL (ref 30.0–36.0)
MCV: 86 fL (ref 78.0–100.0)
Monocytes Absolute: 0.8 10*3/uL (ref 0.1–1.0)
Monocytes Relative: 7 %
NEUTROS ABS: 8.2 10*3/uL — AB (ref 1.7–7.7)
Neutrophils Relative %: 70 %
PLATELETS: 329 10*3/uL (ref 150–400)
RBC: 3.72 MIL/uL — AB (ref 3.87–5.11)
RDW: 15.3 % (ref 11.5–15.5)
WBC: 11.6 10*3/uL — AB (ref 4.0–10.5)

## 2017-12-13 LAB — URINE CULTURE: Culture: NO GROWTH

## 2017-12-13 LAB — INFLUENZA PANEL BY PCR (TYPE A & B)
Influenza A By PCR: NEGATIVE
Influenza B By PCR: NEGATIVE

## 2017-12-13 MED ORDER — IOPAMIDOL (ISOVUE-300) INJECTION 61%
100.0000 mL | Freq: Once | INTRAVENOUS | Status: AC | PRN
  Administered 2017-12-13: 100 mL via INTRAVENOUS

## 2017-12-13 MED ORDER — IOPAMIDOL (ISOVUE-300) INJECTION 61%
30.0000 mL | Freq: Once | INTRAVENOUS | Status: AC
  Administered 2017-12-13: 30 mL via ORAL

## 2017-12-13 MED ORDER — OSELTAMIVIR PHOSPHATE 75 MG PO CAPS
75.0000 mg | ORAL_CAPSULE | Freq: Two times a day (BID) | ORAL | Status: DC
  Administered 2017-12-13: 75 mg via ORAL
  Filled 2017-12-13: qty 1

## 2017-12-13 NOTE — Progress Notes (Addendum)
Patient ID: Ashley Bradford, female   DOB: 02/12/1971, 47 y.o.   MRN: [id number]  Reviewed chart and noted nurse note regarding pt's partner with elevated temp as well. Pt reported improvement of symptoms with ivf and percocet T 98.7  Plan:  -screen for flu ( pt reports received flu shot) -start tamiflu treatment ; if neg then stop

## 2017-12-13 NOTE — Progress Notes (Signed)
Patient ID: Ashley Bradford, female   DOB: 02/17/1971, 47 y.o.   MRN: [id number] HD #1/2 Pt reports they had difficulty getting IV last PM so did not start antibiotics until after 10pm.  She tolerated po last PM (mashed potatoes) with no nausea or vomiting.  Had some lower abdominal pain overnight that resolved with one dose of percocet and slept well.  No HA today.  She reports had chills overnight again, then sweats.  Also c/o some rectal pressure and pain since did enema yesterday AM. Has not had another BM since yesterday  Max 100.7 vss otherwise  Abdomen soft NT incisions clear No evidence of DVT  WBC decreased to 11.6, all other labs WNL    Pt with post-operative fever of unknown etiology.  Still having some intermittent lower abdominal pain, so will get CT of abdomen and pelvis to ensure no delayed surgical injury (is one week out from surgery today)  Unfortunately, her boyfriend is also sick with temperature, so complicates picture a bit.  Pt flu test negative and she has not been around him much as he has been dealing with a family emergency.    D/w pt if CT scan negative, will observe temp today to determine length of antibiotic treatment.  Urine cx will return tomorrow

## 2017-12-13 NOTE — Progress Notes (Addendum)
Patient ID: Darrel Hooverara W Montejano, female   DOB: 01/21/1971, 47 y.o.   MRN: [id number] Pt states has had chills again this PM, temperature taken at that time and no fever noted.  Had some diarrhea s/p contrast, no significant pain with it. Tolerated soft diet with no nausea.  Tmax 100.7 at 0016, afeb today Abd soft NT  CT scan reviewed with interventional radiology:  10.175mm gallstone Small non-obstructing renal calculi bilaterally Inflammation around ureters with mild left hydronephrosis, no obtruction 6.5cm fluid collection in pelvis c/w abscess, surrounding edema of rectum and sigmoid.    Pt on unasyn with some clinical response, but still having chills  D/w Dr.Shick of interventional radiology and he feels abscess amenable to drainage.  Reviewed process with patient and she would like to proceed tomorrow if still having chills.  Will get repeat CBC and creatinine in AM and [facility]

## 2017-12-13 NOTE — Progress Notes (Signed)
Patient ID: Ashley Bradford, female   DOB: 09/09/1971, 47 y.o.   MRN: [id number]  Flu panel neg  D/C tamiflu

## 2017-12-13 NOTE — Progress Notes (Signed)
Patient reports partner has fever of 102.9.

## 2017-12-14 DIAGNOSIS — N739 Female pelvic inflammatory disease, unspecified: Secondary | ICD-10-CM

## 2017-12-14 DIAGNOSIS — N760 Acute vaginitis: Secondary | ICD-10-CM | POA: Diagnosis not present

## 2017-12-14 DIAGNOSIS — Z91010 Allergy to peanuts: Secondary | ICD-10-CM | POA: Diagnosis not present

## 2017-12-14 DIAGNOSIS — I10 Essential (primary) hypertension: Secondary | ICD-10-CM | POA: Diagnosis not present

## 2017-12-14 DIAGNOSIS — N9989 Other postprocedural complications and disorders of genitourinary system: Secondary | ICD-10-CM | POA: Diagnosis not present

## 2017-12-14 DIAGNOSIS — Z885 Allergy status to narcotic agent status: Secondary | ICD-10-CM | POA: Diagnosis not present

## 2017-12-14 DIAGNOSIS — Z882 Allergy status to sulfonamides status: Secondary | ICD-10-CM | POA: Diagnosis not present

## 2017-12-14 LAB — CBC
HCT: 27.8 % — ABNORMAL LOW (ref 36.0–46.0)
HEMOGLOBIN: 8.9 g/dL — AB (ref 12.0–15.0)
MCH: 28.1 pg (ref 26.0–34.0)
MCHC: 32 g/dL (ref 30.0–36.0)
MCV: 87.7 fL (ref 78.0–100.0)
Platelets: 285 10*3/uL (ref 150–400)
RBC: 3.17 MIL/uL — AB (ref 3.87–5.11)
RDW: 15.3 % (ref 11.5–15.5)
WBC: 11.5 10*3/uL — AB (ref 4.0–10.5)

## 2017-12-14 LAB — PROTIME-INR
INR: 1.14
Prothrombin Time: 14.5 seconds (ref 11.4–15.2)

## 2017-12-14 LAB — BASIC METABOLIC PANEL
ANION GAP: 8 (ref 5–15)
BUN: 9 mg/dL (ref 6–20)
CALCIUM: 8.4 mg/dL — AB (ref 8.9–10.3)
CHLORIDE: 106 mmol/L (ref 101–111)
CO2: 22 mmol/L (ref 22–32)
Creatinine, Ser: 0.86 mg/dL (ref 0.44–1.00)
GFR calc non Af Amer: >60 mL/min (ref >60)
Glucose, Bld: 96 mg/dL (ref 65–99)
POTASSIUM: 3.4 mmol/L — AB (ref 3.5–5.1)
Sodium: 136 mmol/L (ref 135–145)

## 2017-12-14 MED ORDER — KETOROLAC TROMETHAMINE 30 MG/ML IJ SOLN
30.0000 mg | Freq: Four times a day (QID) | INTRAMUSCULAR | Status: DC | PRN
  Administered 2017-12-14: 30 mg via INTRAVENOUS
  Filled 2017-12-14: qty 1

## 2017-12-14 MED ORDER — ENOXAPARIN SODIUM 60 MG/0.6ML ~~LOC~~ SOLN: 60.0000 mg | SUBCUTANEOUS | Status: DC

## 2017-12-14 NOTE — Progress Notes (Signed)
Patient ID: Ashley Bradford, female   DOB: 08/20/1971, 47 y.o.   MRN: [id number]   Scheduled for abscess drain placement in [facility] today  [facility] We will consent pt upon arrival  RN aware

## 2017-12-14 NOTE — Progress Notes (Signed)
Patient ID: Ashley Bradford, female   DOB: 12/12/1970, 47 y.o.   MRN: [id number] IR procedure had been scheduled for 100pm and patient transport arrived to take her and patient let RN know she has decided to [facility].  I d/w patient by phone and confirmed her wishes.  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  Advised patient I will try to facilitate this, but that since [facility], she will have to be accepted on the Gyn service there and I am unclear if they will be able logistically to get a drain placed today, or if they would want to reevaluate her treatment plan.  Pt states she understands these obstacles, but [facility] and work on transfer.  RN notified to let pt transport and the IR department know to cancel the procedure

## 2017-12-14 NOTE — Discharge Summary (Signed)
Transfer Summary     Patient Name: Ashley Bradford DOB: 05/10/1971 MRN: [id number]  Date of admission: 12/12/2017  Date of transfer: 12/14/2017  Admitting diagnosis: POST OP FEVER    Secondary diagnosis:  Active Problems:   Postoperative fever   Pelvic abscess in female Pelvic abscess s/p total laparoscopic hysterectomy/bilateral salpingectomies  Additional problems: none     Discharge diagnosis: Postoperative fever                                       S/p TLH/BS 12/06/17                                       Pelvic abscess on CT scan 12/13/17                                                                                       Hospital course:   Pt admitted on 12/12/17, six days s/p TLH/BS with low grade fever, malaise, and mild lower abdominal pain.  Abdomen was not acute on exam but WBC count was elevated to 13.2 and she had a low grade temperature. (highest temperature is 100.7 this hospitalization.) She was admitted on empiric antibiotics with IV unasyn for possible cuff cellulitis. Urine culture was obtained and negative.  On HD #2 she still felt malaise and chills, though no high temperature spikes, and I recommended we get a CT scan to r/o abscess.  The CT scan was significant for 1) a 6.5 cm postoperative abscess and surrounding inflammations/edema/inflammatory phlegmon. (near cuff and rectum) 2. Small scattered probable inflammatory abdominal and pelvic lymph nodes. 3. Bilateral renal calculi. 4. Cholelithiasis. 5. Simple appearing bilateral renal cysts and hepatic cysts. 6. Predominantly left-sided hydroureteronephrosis probably due to inflammation/edema involving the left ureter near the abscess. (per review with radiologist, mild and no evidence of obstruction or injury.    Physical exam  Vitals:   12/14/17 0109 12/14/17 0419 12/14/17 0755 12/14/17 1213  BP:  127/70 (!) 142/70 140/83  Pulse:  77 75 67  Resp:  16 16 16   Temp: 98.3 F (36.8 C) 98.8 F (37.1 C) 99.1  F (37.3 C) 98.8 F (37.1 C)  TempSrc: Oral Oral Oral Oral  SpO2:  98% 100% 98%  Weight:      Height:       General: alert and cooperative CV RRR Lungs CTA bilaterally Incision: Healing well with no significant drainage Minimal vaginal discharge DVT Evaluation: No evidence of DVT seen on physical exam.  Labs: Lab Results  Component Value Date   WBC 11.5 (H) 12/14/2017   HGB 8.9 (L) 12/14/2017   HCT 27.8 (L) 12/14/2017   MCV 87.7 12/14/2017   PLT 285 12/14/2017   CMP Latest Ref Rng & Units 12/14/2017  Glucose 65 - 99 mg/dL 96  BUN 6 - 20 mg/dL 9  Creatinine 6.570.44 - 8.461.00 mg/dL 9.620.86  Sodium 952135 - 841145 mmol/L 136  Potassium 3.5 - 5.1 mmol/L 3.4(L)  Chloride  101 - 111 mmol/L 106  CO2 22 - 32 mmol/L 22  Calcium 8.9 - 10.3 mg/dL 1.6(X)  Total Protein 6.5 - 8.1 g/dL -  Total Bilirubin 0.3 - 1.2 mg/dL -  Alkaline Phos 38 - 096 U/L -  AST 15 - 41 U/L -  ALT 14 - 54 U/L -     Allergies as of 12/14/2017      Reactions   Peanut-containing Drug Products Anaphylaxis   Hydromorphone Itching   Morphine And Related Itching   Sulfa Antibiotics Rash      Medication List    STOP taking these medications   albuterol 108 (90 Base) MCG/ACT inhaler Commonly known as:  PROVENTIL HFA;VENTOLIN HFA   BYSTOLIC 10 MG tablet Generic drug:  nebivolol   cetirizine 10 MG tablet Commonly known as:  ZYRTEC   clobetasol cream 0.05 % Commonly known as:  TEMOVATE   EPINEPHrine 0.3 mg/0.3 mL Soaj injection Commonly known as:  EPI-PEN   furosemide 20 MG tablet Commonly known as:  LASIX   HYDROcodone-acetaminophen 5-325 MG tablet Commonly known as:  NORCO/VICODIN   ibuprofen 600 MG tablet Commonly known as:  ADVIL,MOTRIN   IRON PO   ketorolac 0.5 % ophthalmic solution Commonly known as:  ACULAR   Lorcaserin HCl ER 20 MG Tb24 Commonly known as:  BELVIQ XR   methylphenidate 20 MG tablet Commonly known as:  RITALIN   methylphenidate 36 MG CR tablet Commonly known as:  CONCERTA    pantoprazole 40 MG tablet Commonly known as:  PROTONIX   Vitamin D (Ergocalciferol) 50000 units Caps capsule Commonly known as:  DRISDOL   zolpidem 10 MG tablet Commonly known as:  AMBIEN       Diet: NPO for possible IR procedure  Activity: OOB as tolerated    Follow up Appt: Future Appointments  Date Time Provider Department Center  01/18/2018  4:25 PM Junie Spencer, FNP [facility] None   Follow up Visit:To be decided at discharge from accepting facility   12/14/2017 Oliver Pila, MD

## 2017-12-14 NOTE — Progress Notes (Signed)
Rn called report to Carm[facility]. Pt to be placed in bed 3 on unit 5C. [facility] to transport pt, per pt request.

## 2017-12-14 NOTE — Progress Notes (Signed)
Patient ID: Ashley Bradford, female   DOB: 05/13/1971, 47 y.o.   MRN: [id number] Pt states feeling a bit worse today.  More sore in lower abdomen but no N/V or further diarrhea/BM  NPO since midnight for pelvic drain palcement by IR today  Tmax 100.2  Abdome soft, mild tenderness in lower abdomen Incisions clear  WBC 11.5  Unasyn Day 2/3 Pt still uncomfortable and having low grade temps so ready to proceed with pelvic drain. Toradol for discomfort this AM since NPO for procedure Lovenox held for procedure

## 2017-12-17 DIAGNOSIS — Z79899 Other long term (current) drug therapy: Secondary | ICD-10-CM | POA: Diagnosis not present

## 2017-12-17 DIAGNOSIS — I10 Essential (primary) hypertension: Secondary | ICD-10-CM | POA: Diagnosis not present

## 2017-12-17 DIAGNOSIS — N828 Other female genital tract fistulae: Secondary | ICD-10-CM | POA: Diagnosis not present

## 2017-12-17 DIAGNOSIS — T83192A Other mechanical complication of urinary stent, initial encounter: Secondary | ICD-10-CM | POA: Diagnosis not present

## 2017-12-17 DIAGNOSIS — N3289 Other specified disorders of bladder: Secondary | ICD-10-CM | POA: Diagnosis not present

## 2017-12-17 DIAGNOSIS — N739 Female pelvic inflammatory disease, unspecified: Secondary | ICD-10-CM | POA: Diagnosis not present

## 2017-12-17 DIAGNOSIS — N821 Other female urinary-genital tract fistulae: Secondary | ICD-10-CM | POA: Diagnosis not present

## 2017-12-18 DIAGNOSIS — Z79899 Other long term (current) drug therapy: Secondary | ICD-10-CM | POA: Diagnosis not present

## 2017-12-18 DIAGNOSIS — N3289 Other specified disorders of bladder: Secondary | ICD-10-CM | POA: Diagnosis not present

## 2017-12-18 DIAGNOSIS — N828 Other female genital tract fistulae: Secondary | ICD-10-CM | POA: Diagnosis not present

## 2017-12-18 DIAGNOSIS — I10 Essential (primary) hypertension: Secondary | ICD-10-CM | POA: Diagnosis not present

## 2017-12-18 DIAGNOSIS — N821 Other female urinary-genital tract fistulae: Secondary | ICD-10-CM | POA: Diagnosis not present

## 2017-12-18 DIAGNOSIS — N739 Female pelvic inflammatory disease, unspecified: Secondary | ICD-10-CM | POA: Diagnosis not present

## 2017-12-19 DIAGNOSIS — N739 Female pelvic inflammatory disease, unspecified: Secondary | ICD-10-CM | POA: Diagnosis not present

## 2017-12-19 DIAGNOSIS — N828 Other female genital tract fistulae: Secondary | ICD-10-CM | POA: Diagnosis not present

## 2017-12-19 DIAGNOSIS — Z79899 Other long term (current) drug therapy: Secondary | ICD-10-CM | POA: Diagnosis not present

## 2017-12-19 DIAGNOSIS — I10 Essential (primary) hypertension: Secondary | ICD-10-CM | POA: Diagnosis not present

## 2017-12-19 DIAGNOSIS — N821 Other female urinary-genital tract fistulae: Secondary | ICD-10-CM | POA: Diagnosis not present

## 2017-12-19 DIAGNOSIS — N3289 Other specified disorders of bladder: Secondary | ICD-10-CM | POA: Diagnosis not present

## 2017-12-21 DIAGNOSIS — Z79891 Long term (current) use of opiate analgesic: Secondary | ICD-10-CM | POA: Diagnosis not present

## 2017-12-21 DIAGNOSIS — Z791 Long term (current) use of non-steroidal anti-inflammatories (NSAID): Secondary | ICD-10-CM | POA: Diagnosis not present

## 2017-12-21 DIAGNOSIS — Z960 Presence of urogenital implants: Secondary | ICD-10-CM | POA: Diagnosis not present

## 2017-12-21 DIAGNOSIS — N828 Other female genital tract fistulae: Secondary | ICD-10-CM | POA: Diagnosis not present

## 2017-12-25 DIAGNOSIS — Z791 Long term (current) use of non-steroidal anti-inflammatories (NSAID): Secondary | ICD-10-CM | POA: Diagnosis not present

## 2017-12-25 DIAGNOSIS — Z79891 Long term (current) use of opiate analgesic: Secondary | ICD-10-CM | POA: Diagnosis not present

## 2017-12-25 DIAGNOSIS — N828 Other female genital tract fistulae: Secondary | ICD-10-CM | POA: Diagnosis not present

## 2017-12-25 DIAGNOSIS — Z960 Presence of urogenital implants: Secondary | ICD-10-CM | POA: Diagnosis not present

## 2017-12-27 ENCOUNTER — Encounter: Payer: Self-pay | Admitting: *Deleted

## 2017-12-27 DIAGNOSIS — S3710XD Unspecified injury of ureter, subsequent encounter: Secondary | ICD-10-CM | POA: Diagnosis not present

## 2017-12-28 DIAGNOSIS — Z6841 Body Mass Index (BMI) 40.0 and over, adult: Secondary | ICD-10-CM | POA: Diagnosis not present

## 2017-12-28 DIAGNOSIS — Z713 Dietary counseling and surveillance: Secondary | ICD-10-CM | POA: Diagnosis not present

## 2018-01-01 ENCOUNTER — Other Ambulatory Visit: Payer: Self-pay | Admitting: Nurse Practitioner

## 2018-01-01 ENCOUNTER — Other Ambulatory Visit: Payer: Self-pay | Admitting: Family

## 2018-01-01 DIAGNOSIS — Z791 Long term (current) use of non-steroidal anti-inflammatories (NSAID): Secondary | ICD-10-CM | POA: Diagnosis not present

## 2018-01-01 DIAGNOSIS — N828 Other female genital tract fistulae: Secondary | ICD-10-CM | POA: Diagnosis not present

## 2018-01-01 DIAGNOSIS — Z960 Presence of urogenital implants: Secondary | ICD-10-CM | POA: Diagnosis not present

## 2018-01-01 DIAGNOSIS — Z79891 Long term (current) use of opiate analgesic: Secondary | ICD-10-CM | POA: Diagnosis not present

## 2018-01-02 DIAGNOSIS — D500 Iron deficiency anemia secondary to blood loss (chronic): Secondary | ICD-10-CM | POA: Diagnosis not present

## 2018-01-02 DIAGNOSIS — N9989 Other postprocedural complications and disorders of genitourinary system: Secondary | ICD-10-CM | POA: Diagnosis not present

## 2018-01-02 DIAGNOSIS — S3710XS Unspecified injury of ureter, sequela: Secondary | ICD-10-CM | POA: Diagnosis not present

## 2018-01-03 ENCOUNTER — Ambulatory Visit (INDEPENDENT_AMBULATORY_CARE_PROVIDER_SITE_OTHER): Payer: BLUE CROSS/BLUE SHIELD

## 2018-01-03 DIAGNOSIS — Z791 Long term (current) use of non-steroidal anti-inflammatories (NSAID): Secondary | ICD-10-CM | POA: Diagnosis not present

## 2018-01-03 DIAGNOSIS — Z79891 Long term (current) use of opiate analgesic: Secondary | ICD-10-CM

## 2018-01-03 DIAGNOSIS — Z960 Presence of urogenital implants: Secondary | ICD-10-CM | POA: Diagnosis not present

## 2018-01-03 DIAGNOSIS — N828 Other female genital tract fistulae: Secondary | ICD-10-CM

## 2018-01-08 ENCOUNTER — Telehealth: Payer: Self-pay

## 2018-01-08 MED ORDER — METOPROLOL SUCCINATE ER 50 MG PO TB24: 50.0000 mg | ORAL_TABLET | Freq: Every day | ORAL | 3 refills | Status: DC

## 2018-01-08 NOTE — Telephone Encounter (Signed)
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 We will change to Metoprolol 50 XL. Prescription sent to pharmacy. Needs to monitor BP at home and report any lows or highs.

## 2018-01-08 NOTE — Telephone Encounter (Signed)
Insurance denied Bystolic  Two alternatives need to be tried  Atenolol, bisoprolol, carvedilol, metroprolol succinate

## 2018-01-10 DIAGNOSIS — S3710XD Unspecified injury of ureter, subsequent encounter: Secondary | ICD-10-CM | POA: Diagnosis not present

## 2018-01-10 DIAGNOSIS — F54 Psychological and behavioral factors associated with disorders or diseases classified elsewhere: Secondary | ICD-10-CM | POA: Diagnosis not present

## 2018-01-10 DIAGNOSIS — Z6841 Body Mass Index (BMI) 40.0 and over, adult: Secondary | ICD-10-CM | POA: Diagnosis not present

## 2018-01-10 DIAGNOSIS — S3210XA Unspecified fracture of sacrum, initial encounter for closed fracture: Secondary | ICD-10-CM | POA: Diagnosis not present

## 2018-01-10 DIAGNOSIS — Z7189 Other specified counseling: Secondary | ICD-10-CM | POA: Diagnosis not present

## 2018-01-10 DIAGNOSIS — R390 Extravasation of urine: Secondary | ICD-10-CM | POA: Diagnosis not present

## 2018-01-15 DIAGNOSIS — N828 Other female genital tract fistulae: Secondary | ICD-10-CM | POA: Diagnosis not present

## 2018-01-15 DIAGNOSIS — S3710XA Unspecified injury of ureter, initial encounter: Secondary | ICD-10-CM | POA: Diagnosis not present

## 2018-01-15 DIAGNOSIS — Z436 Encounter for attention to other artificial openings of urinary tract: Secondary | ICD-10-CM | POA: Diagnosis not present

## 2018-01-15 DIAGNOSIS — N739 Female pelvic inflammatory disease, unspecified: Secondary | ICD-10-CM | POA: Diagnosis not present

## 2018-01-15 DIAGNOSIS — E669 Obesity, unspecified: Secondary | ICD-10-CM | POA: Diagnosis not present

## 2018-01-15 DIAGNOSIS — T192XXA Foreign body in vulva and vagina, initial encounter: Secondary | ICD-10-CM | POA: Diagnosis not present

## 2018-01-15 DIAGNOSIS — S3719XA Other injury of ureter, initial encounter: Secondary | ICD-10-CM | POA: Diagnosis not present

## 2018-01-15 DIAGNOSIS — T191XXA Foreign body in bladder, initial encounter: Secondary | ICD-10-CM | POA: Diagnosis not present

## 2018-01-15 DIAGNOSIS — Z6841 Body Mass Index (BMI) 40.0 and over, adult: Secondary | ICD-10-CM | POA: Diagnosis not present

## 2018-01-16 DIAGNOSIS — S3719XA Other injury of ureter, initial encounter: Secondary | ICD-10-CM | POA: Diagnosis not present

## 2018-01-16 DIAGNOSIS — T192XXA Foreign body in vulva and vagina, initial encounter: Secondary | ICD-10-CM | POA: Diagnosis not present

## 2018-01-16 DIAGNOSIS — Z436 Encounter for attention to other artificial openings of urinary tract: Secondary | ICD-10-CM | POA: Diagnosis not present

## 2018-01-16 DIAGNOSIS — N828 Other female genital tract fistulae: Secondary | ICD-10-CM | POA: Diagnosis not present

## 2018-01-16 DIAGNOSIS — N200 Calculus of kidney: Secondary | ICD-10-CM | POA: Diagnosis not present

## 2018-01-16 DIAGNOSIS — Z6841 Body Mass Index (BMI) 40.0 and over, adult: Secondary | ICD-10-CM | POA: Diagnosis not present

## 2018-01-16 DIAGNOSIS — E669 Obesity, unspecified: Secondary | ICD-10-CM | POA: Diagnosis not present

## 2018-01-17 ENCOUNTER — Ambulatory Visit: Payer: BLUE CROSS/BLUE SHIELD | Admitting: Family

## 2018-01-17 DIAGNOSIS — S3719XA Other injury of ureter, initial encounter: Secondary | ICD-10-CM | POA: Diagnosis not present

## 2018-01-17 DIAGNOSIS — E669 Obesity, unspecified: Secondary | ICD-10-CM | POA: Diagnosis not present

## 2018-01-17 DIAGNOSIS — N828 Other female genital tract fistulae: Secondary | ICD-10-CM | POA: Diagnosis not present

## 2018-01-17 DIAGNOSIS — Z436 Encounter for attention to other artificial openings of urinary tract: Secondary | ICD-10-CM | POA: Diagnosis not present

## 2018-01-17 DIAGNOSIS — T192XXA Foreign body in vulva and vagina, initial encounter: Secondary | ICD-10-CM | POA: Diagnosis not present

## 2018-01-17 DIAGNOSIS — Z6841 Body Mass Index (BMI) 40.0 and over, adult: Secondary | ICD-10-CM | POA: Diagnosis not present

## 2018-01-18 ENCOUNTER — Ambulatory Visit: Payer: BLUE CROSS/BLUE SHIELD | Admitting: Family

## 2018-01-21 ENCOUNTER — Encounter: Payer: Self-pay | Admitting: Family

## 2018-01-22 DIAGNOSIS — N739 Female pelvic inflammatory disease, unspecified: Secondary | ICD-10-CM | POA: Diagnosis not present

## 2018-01-22 DIAGNOSIS — N821 Other female urinary-genital tract fistulae: Secondary | ICD-10-CM | POA: Diagnosis not present

## 2018-01-23 DIAGNOSIS — Z885 Allergy status to narcotic agent status: Secondary | ICD-10-CM | POA: Diagnosis not present

## 2018-01-23 DIAGNOSIS — J45909 Unspecified asthma, uncomplicated: Secondary | ICD-10-CM | POA: Diagnosis not present

## 2018-01-23 DIAGNOSIS — K219 Gastro-esophageal reflux disease without esophagitis: Secondary | ICD-10-CM | POA: Diagnosis not present

## 2018-01-23 DIAGNOSIS — K317 Polyp of stomach and duodenum: Secondary | ICD-10-CM | POA: Diagnosis not present

## 2018-01-23 DIAGNOSIS — K2970 Gastritis, unspecified, without bleeding: Secondary | ICD-10-CM | POA: Diagnosis not present

## 2018-01-23 DIAGNOSIS — I10 Essential (primary) hypertension: Secondary | ICD-10-CM | POA: Diagnosis not present

## 2018-01-23 DIAGNOSIS — Z882 Allergy status to sulfonamides status: Secondary | ICD-10-CM | POA: Diagnosis not present

## 2018-01-23 DIAGNOSIS — K319 Disease of stomach and duodenum, unspecified: Secondary | ICD-10-CM | POA: Diagnosis not present

## 2018-01-23 DIAGNOSIS — F419 Anxiety disorder, unspecified: Secondary | ICD-10-CM | POA: Diagnosis not present

## 2018-01-23 DIAGNOSIS — Z6841 Body Mass Index (BMI) 40.0 and over, adult: Secondary | ICD-10-CM | POA: Diagnosis not present

## 2018-01-23 DIAGNOSIS — Z01818 Encounter for other preprocedural examination: Secondary | ICD-10-CM | POA: Diagnosis not present

## 2018-01-23 DIAGNOSIS — Z880 Allergy status to penicillin: Secondary | ICD-10-CM | POA: Diagnosis not present

## 2018-01-23 DIAGNOSIS — F329 Major depressive disorder, single episode, unspecified: Secondary | ICD-10-CM | POA: Diagnosis not present

## 2018-01-27 DIAGNOSIS — R000 Tachycardia, unspecified: Secondary | ICD-10-CM | POA: Diagnosis not present

## 2018-01-27 DIAGNOSIS — N12 Tubulo-interstitial nephritis, not specified as acute or chronic: Secondary | ICD-10-CM | POA: Diagnosis not present

## 2018-01-27 DIAGNOSIS — N1330 Unspecified hydronephrosis: Secondary | ICD-10-CM | POA: Diagnosis not present

## 2018-01-27 DIAGNOSIS — N821 Other female urinary-genital tract fistulae: Secondary | ICD-10-CM | POA: Diagnosis not present

## 2018-01-27 DIAGNOSIS — R509 Fever, unspecified: Secondary | ICD-10-CM | POA: Diagnosis not present

## 2018-01-27 DIAGNOSIS — R1084 Generalized abdominal pain: Secondary | ICD-10-CM | POA: Diagnosis not present

## 2018-01-27 DIAGNOSIS — F909 Attention-deficit hyperactivity disorder, unspecified type: Secondary | ICD-10-CM | POA: Diagnosis not present

## 2018-01-27 DIAGNOSIS — G4700 Insomnia, unspecified: Secondary | ICD-10-CM | POA: Diagnosis not present

## 2018-01-27 DIAGNOSIS — D649 Anemia, unspecified: Secondary | ICD-10-CM | POA: Diagnosis not present

## 2018-01-27 DIAGNOSIS — Z6841 Body Mass Index (BMI) 40.0 and over, adult: Secondary | ICD-10-CM | POA: Diagnosis not present

## 2018-01-27 DIAGNOSIS — S3710XA Unspecified injury of ureter, initial encounter: Secondary | ICD-10-CM | POA: Diagnosis not present

## 2018-01-30 DIAGNOSIS — N1330 Unspecified hydronephrosis: Secondary | ICD-10-CM | POA: Diagnosis not present

## 2018-02-01 DIAGNOSIS — S3710XD Unspecified injury of ureter, subsequent encounter: Secondary | ICD-10-CM | POA: Diagnosis not present

## 2018-02-01 DIAGNOSIS — N821 Other female urinary-genital tract fistulae: Secondary | ICD-10-CM | POA: Diagnosis not present

## 2018-02-05 ENCOUNTER — Telehealth: Payer: Self-pay

## 2018-02-05 NOTE — Telephone Encounter (Signed)
Aware. 

## 2018-02-05 NOTE — Telephone Encounter (Signed)
Please let patient know insurance denied prior auth for Ashley Bradford

## 2018-02-05 NOTE — Telephone Encounter (Signed)
INsurance denied prior [place] for [employer]

## 2018-02-07 ENCOUNTER — Telehealth: Payer: Self-pay | Admitting: Family

## 2018-02-08 DIAGNOSIS — N821 Other female urinary-genital tract fistulae: Secondary | ICD-10-CM | POA: Diagnosis not present

## 2018-02-11 MED ORDER — IBUPROFEN 800 MG PO TABS: 800.0000 mg | ORAL_TABLET | Freq: Two times a day (BID) | ORAL | 3 refills | Status: DC | PRN

## 2018-02-11 NOTE — Telephone Encounter (Signed)
Prescription sent to pharmacy.

## 2018-02-22 DIAGNOSIS — N821 Other female urinary-genital tract fistulae: Secondary | ICD-10-CM | POA: Diagnosis not present

## 2018-02-26 ENCOUNTER — Other Ambulatory Visit: Payer: Self-pay | Admitting: Family

## 2018-02-26 DIAGNOSIS — N132 Hydronephrosis with renal and ureteral calculous obstruction: Secondary | ICD-10-CM | POA: Diagnosis not present

## 2018-02-26 DIAGNOSIS — N390 Urinary tract infection, site not specified: Secondary | ICD-10-CM | POA: Diagnosis not present

## 2018-02-26 DIAGNOSIS — T83511A Infection and inflammatory reaction due to indwelling urethral catheter, initial encounter: Secondary | ICD-10-CM | POA: Diagnosis not present

## 2018-02-26 DIAGNOSIS — N1330 Unspecified hydronephrosis: Secondary | ICD-10-CM | POA: Diagnosis not present

## 2018-02-26 DIAGNOSIS — B999 Unspecified infectious disease: Secondary | ICD-10-CM | POA: Diagnosis not present

## 2018-03-07 DIAGNOSIS — N200 Calculus of kidney: Secondary | ICD-10-CM | POA: Diagnosis not present

## 2018-03-07 DIAGNOSIS — N821 Other female urinary-genital tract fistulae: Secondary | ICD-10-CM | POA: Diagnosis not present

## 2018-03-07 DIAGNOSIS — Z466 Encounter for fitting and adjustment of urinary device: Secondary | ICD-10-CM | POA: Diagnosis not present

## 2018-03-07 DIAGNOSIS — K219 Gastro-esophageal reflux disease without esophagitis: Secondary | ICD-10-CM | POA: Diagnosis not present

## 2018-03-07 DIAGNOSIS — I10 Essential (primary) hypertension: Secondary | ICD-10-CM | POA: Diagnosis not present

## 2018-03-07 DIAGNOSIS — E669 Obesity, unspecified: Secondary | ICD-10-CM | POA: Diagnosis not present

## 2018-03-07 DIAGNOSIS — S3710XA Unspecified injury of ureter, initial encounter: Secondary | ICD-10-CM | POA: Diagnosis not present

## 2018-03-07 DIAGNOSIS — N210 Calculus in bladder: Secondary | ICD-10-CM | POA: Diagnosis not present

## 2018-03-07 DIAGNOSIS — N209 Urinary calculus, unspecified: Secondary | ICD-10-CM | POA: Diagnosis not present

## 2018-03-07 DIAGNOSIS — N828 Other female genital tract fistulae: Secondary | ICD-10-CM | POA: Diagnosis not present

## 2018-03-07 DIAGNOSIS — Z6841 Body Mass Index (BMI) 40.0 and over, adult: Secondary | ICD-10-CM | POA: Diagnosis not present

## 2018-03-07 DIAGNOSIS — J45909 Unspecified asthma, uncomplicated: Secondary | ICD-10-CM | POA: Diagnosis not present

## 2018-03-12 ENCOUNTER — Ambulatory Visit (INDEPENDENT_AMBULATORY_CARE_PROVIDER_SITE_OTHER): Payer: BLUE CROSS/BLUE SHIELD | Admitting: Family Medicine

## 2018-03-12 ENCOUNTER — Encounter: Payer: Self-pay | Admitting: Family Medicine

## 2018-03-12 VITALS — BP 131/90 | HR 82 | Temp 97.3°F | Ht 64.0 in | Wt 235.2 lb

## 2018-03-12 DIAGNOSIS — F908 Attention-deficit hyperactivity disorder, other type: Secondary | ICD-10-CM | POA: Diagnosis not present

## 2018-03-12 DIAGNOSIS — I10 Essential (primary) hypertension: Secondary | ICD-10-CM | POA: Diagnosis not present

## 2018-03-12 DIAGNOSIS — R82998 Other abnormal findings in urine: Secondary | ICD-10-CM | POA: Diagnosis not present

## 2018-03-12 DIAGNOSIS — Z960 Presence of urogenital implants: Secondary | ICD-10-CM | POA: Diagnosis not present

## 2018-03-12 MED ORDER — METHYLPHENIDATE HCL ER (OSM) 36 MG PO TBCR: 36.0000 mg | EXTENDED_RELEASE_TABLET | Freq: Every day | ORAL | 0 refills | Status: DC

## 2018-03-12 MED ORDER — CARVEDILOL 3.125 MG PO TABS: 3.1250 mg | ORAL_TABLET | Freq: Two times a day (BID) | ORAL | 3 refills | Status: DC

## 2018-03-12 NOTE — Progress Notes (Signed)
   HPI  Patient presents today for concern about blood pressure, also for refill of Concerta.  Patient states Concerta is effective for ADHD, she needs a refill. She will see her PCP for further refills.  Hypertension Patient reports that she was recently changed from Bystolic to metoprolol due to insurance preference, she feels that the metoprolol caused her to bottom out.  She felt very weak and tired and sick, she did not take it today.  She has a mild headache today after not taking her medication  PMH: Smoking status noted ROS: Per HPI  Objective: BP 131/90   Pulse 82   Temp (!) 97.3 F (36.3 C) (Oral)   Ht 5\' 4"  (1.626 m)   Wt 235 lb 3.2 oz (106.7 kg)   LMP 11/23/2017 (Approximate)   BMI 40.37 kg/m  Gen: NAD, alert, cooperative with exam HEENT: NCAT CV: RRR, good S1/S2, no murmur Resp: CTABL, no wheezes, non-labored Ext: No edema, warm Neuro: Alert and oriented, No gross deficits  Assessment and plan:  # HTN Change toprol to coreg, lowest dose See PCP within 1 month to discuss ADHD and recheck BP  # ADHD Refill concerta, discuss future refills with PCP   Meds ordered this encounter  Medications  . carvedilol (COREG) 3.125 MG tablet    Sig: Take 1 tablet (3.125 mg total) by mouth 2 (two) times daily with a meal.    Dispense:  60 tablet    Refill:  3  . methylphenidate 36 MG PO CR tablet    Sig: Take 1 tablet (36 mg total) by mouth daily.    Dispense:  30 tablet    Refill:  0    Murtis SinkSam Bradshaw, MD [facility] 03/12/2018, 4:53 PM

## 2018-03-12 NOTE — Patient Instructions (Signed)
Great to see you!  Stop metoprolol, start coreg 1 pill twice daily

## 2018-03-19 ENCOUNTER — Ambulatory Visit (INDEPENDENT_AMBULATORY_CARE_PROVIDER_SITE_OTHER): Payer: BLUE CROSS/BLUE SHIELD | Admitting: Family

## 2018-03-19 ENCOUNTER — Encounter: Payer: Self-pay | Admitting: Family

## 2018-03-19 VITALS — BP 140/93 | HR 87 | Temp 97.0°F | Ht 64.0 in | Wt 236.8 lb

## 2018-03-19 DIAGNOSIS — I10 Essential (primary) hypertension: Secondary | ICD-10-CM | POA: Diagnosis not present

## 2018-03-19 DIAGNOSIS — F908 Attention-deficit hyperactivity disorder, other type: Secondary | ICD-10-CM | POA: Diagnosis not present

## 2018-03-19 DIAGNOSIS — F411 Generalized anxiety disorder: Secondary | ICD-10-CM | POA: Diagnosis not present

## 2018-03-19 DIAGNOSIS — R6889 Other general symptoms and signs: Secondary | ICD-10-CM | POA: Diagnosis not present

## 2018-03-19 DIAGNOSIS — D509 Iron deficiency anemia, unspecified: Secondary | ICD-10-CM | POA: Diagnosis not present

## 2018-03-19 MED ORDER — METHYLPHENIDATE HCL ER (OSM) 36 MG PO TBCR: 36.0000 mg | EXTENDED_RELEASE_TABLET | Freq: Every day | ORAL | 0 refills | Status: DC

## 2018-03-19 MED ORDER — ESCITALOPRAM OXALATE 10 MG PO TABS: 10.0000 mg | ORAL_TABLET | Freq: Every day | ORAL | 3 refills | Status: DC

## 2018-03-19 NOTE — Patient Instructions (Signed)

## 2018-03-19 NOTE — Progress Notes (Signed)
Subjective:    Patient ID: Ashley Bradford, female    DOB: June 18, 1971, 47 y.o.   MRN: [id number]  Chief Complaint  Patient presents with  . ADHD    medication refill   Pt presents to the office today for ADHD follow up. She reports she has recently returned to work after her hysterectomy. States her boss has changed and "seems to be targeting her". States she has never been written up at work and has been written up since returning to work.   States she is having a great deal of anxiety when she is at work.   Hypertension  This is a chronic problem. The current episode started more than 1 year ago. The problem has been waxing and waning since onset. The problem is uncontrolled. Associated symptoms include anxiety and malaise/fatigue. Pertinent negatives include no peripheral edema or shortness of breath. Risk factors for coronary artery disease include obesity. Past treatments include beta blockers. The current treatment provides mild improvement.  Anxiety  Presents for follow-up visit. Symptoms include excessive worry, irritability and nervous/anxious behavior. Patient reports no shortness of breath. Symptoms occur most days. The severity of symptoms is moderate.   Her past medical history is significant for anemia.  Anemia  Presents for follow-up visit. Symptoms include malaise/fatigue. There has been no bruising/bleeding easily or fever.  ADHD Pt states her anxiety is worse and having trouble focusing at work.    Review of Systems  Constitutional: Positive for irritability and malaise/fatigue. Negative for fever.  Respiratory: Negative for shortness of breath.   Hematological: Does not bruise/bleed easily.  Psychiatric/Behavioral: The patient is nervous/anxious.   All other systems reviewed and are negative.      Objective:   Physical Exam  Constitutional: She is oriented to person, place, and time. She appears well-developed and well-nourished. No distress.  HENT:  Head:  Normocephalic and atraumatic.  Eyes: Pupils are equal, round, and reactive to light.  Neck: Normal range of motion. Neck supple. No thyromegaly present.  Cardiovascular: Normal rate, regular rhythm, normal heart sounds and intact distal pulses.  No murmur heard. Pulmonary/Chest: Effort normal and breath sounds normal. No respiratory distress. She has no wheezes.  Abdominal: Soft. Bowel sounds are normal. She exhibits no distension. There is no tenderness.  Musculoskeletal: Normal range of motion. She exhibits no edema or tenderness.  Neurological: She is alert and oriented to person, place, and time. She has normal reflexes. No cranial nerve deficit.  Skin: Skin is warm and dry.  Psychiatric: She has a normal mood and affect. Her behavior is normal. Judgment and thought content normal.  Tearful     Vitals reviewed.     BP (!) 140/93   Pulse 87   Temp (!) 97 F (36.1 C) (Oral)   Ht _0  (1.626 m)   Wt 236 lb 12.8 oz (107.4 kg)   LMP 11/23/2017 (Approximate)   BMI 40.65 kg/m      Assessment & Plan:  Ashley Bradford comes in today with chief complaint of ADHD (medication refill)   Diagnosis and orders addressed:  1. ADHD, adult residual type Meds as prescribed Behavior modification as needed Follow-up for recheck in 3 months - methylphenidate 36 MG PO CR tablet; Take 1 tablet (36 mg total) by mouth daily.  Dispense: 30 tablet; Refill: 0 - methylphenidate 36 MG PO CR tablet; Take 1 tablet (36 mg total) by mouth daily.  Dispense: 30 tablet; Refill: 0 - methylphenidate 36 MG PO CR tablet;  Take 1 tablet (36 mg total) by mouth daily.  Dispense: 30 tablet; Refill: 0 - CMP14+EGFR  2. Morbid obesity ([place]) - CMP14+EGFR  3. Essential hypertension - CMP14+EGFR  4. GAD (generalized anxiety disorder) Will start Lexapro 10 mg today Stress management discussed - escitalopram (LEXAPRO) 10 MG tablet; Take 1 tablet (10 mg total) by mouth daily.  Dispense: 90 tablet; Refill: 3 -  CMP14+EGFR  5. Iron deficiency anemia, unspecified iron deficiency anemia type - Anemia Profile B - CMP14+EGFR   Labs pending Health Maintenance reviewed Diet and exercise encouraged  Follow up plan: 6 weeks to recheck GAD   Evelina Dun, FNP

## 2018-03-20 LAB — CMP14+EGFR
ALK PHOS: 106 IU/L (ref 39–117)
ALT: 34 IU/L — ABNORMAL HIGH (ref 0–32)
AST: 22 IU/L (ref 0–40)
Albumin/Globulin Ratio: 1.5 (ref 1.2–2.2)
Albumin: 4.3 g/dL (ref 3.5–5.5)
BUN/Creatinine Ratio: 16 (ref 9–23)
BUN: 12 mg/dL (ref 6–24)
Bilirubin Total: 0.5 mg/dL (ref 0.0–1.2)
CO2: 23 mmol/L (ref 20–29)
CREATININE: 0.77 mg/dL (ref 0.57–1.00)
Calcium: 9.4 mg/dL (ref 8.7–10.2)
Chloride: 102 mmol/L (ref 96–106)
GFR calc Af Amer: 107 mL/min/{1.73_m2} (ref >59)
GFR calc non Af Amer: 93 mL/min/{1.73_m2} (ref >59)
Globulin, Total: 2.9 g/dL (ref 1.5–4.5)
Glucose: 89 mg/dL (ref 65–99)
Potassium: 3.5 mmol/L (ref 3.5–5.2)
Sodium: 139 mmol/L (ref 134–144)
Total Protein: 7.2 g/dL (ref 6.0–8.5)

## 2018-03-20 LAB — ANEMIA PROFILE B
BASOS ABS: 0.1 10*3/uL (ref 0.0–0.2)
Basos: 1 %
EOS (ABSOLUTE): 0.2 10*3/uL (ref 0.0–0.4)
Eos: 2 %
FOLATE: 11.1 ng/mL (ref >3.0)
Ferritin: 19 ng/mL (ref 15–150)
Hematocrit: 34.2 % (ref 34.0–46.6)
Hemoglobin: 10.9 g/dL — ABNORMAL LOW (ref 11.1–15.9)
IMMATURE GRANULOCYTES: 0 %
Immature Grans (Abs): 0 10*3/uL (ref 0.0–0.1)
Iron Saturation: 9 % — CL (ref 15–55)
Iron: 31 ug/dL (ref 27–159)
LYMPHS ABS: 2.1 10*3/uL (ref 0.7–3.1)
Lymphs: 22 %
MCH: 26.3 pg — AB (ref 26.6–33.0)
MCHC: 31.9 g/dL (ref 31.5–35.7)
MCV: 82 fL (ref 79–97)
Monocytes Absolute: 0.5 10*3/uL (ref 0.1–0.9)
Monocytes: 6 %
NEUTROS PCT: 69 %
Neutrophils Absolute: 6.4 10*3/uL (ref 1.4–7.0)
PLATELETS: 346 10*3/uL (ref 150–450)
RBC: 4.15 x10E6/uL (ref 3.77–5.28)
RDW: 16.1 % — ABNORMAL HIGH (ref 12.3–15.4)
Retic Ct Pct: 2.2 % (ref 0.6–2.6)
TIBC: 357 ug/dL (ref 250–450)
UIBC: 326 ug/dL (ref 131–425)
Vitamin B-12: 690 pg/mL (ref 232–1245)
WBC: 9.4 10*3/uL (ref 3.4–10.8)

## 2018-03-21 ENCOUNTER — Other Ambulatory Visit: Payer: Self-pay | Admitting: Family

## 2018-03-21 DIAGNOSIS — D509 Iron deficiency anemia, unspecified: Secondary | ICD-10-CM

## 2018-04-03 ENCOUNTER — Telehealth: Payer: Self-pay | Admitting: Family

## 2018-04-03 MED ORDER — SUVOREXANT 10 MG PO TABS: 10.0000 mg | ORAL_TABLET | Freq: Every day | ORAL | 2 refills | Status: DC

## 2018-04-03 NOTE — Telephone Encounter (Signed)
Aware.  Script sent in. 

## 2018-04-03 NOTE — Telephone Encounter (Signed)
Belsomra Prescription sent to pharmacy

## 2018-04-12 ENCOUNTER — Telehealth: Payer: Self-pay | Admitting: Family

## 2018-04-12 MED ORDER — TRAZODONE HCL 50 MG PO TABS: 25.0000 mg | ORAL_TABLET | Freq: Every evening | ORAL | 3 refills | Status: DC | PRN

## 2018-04-12 NOTE — Telephone Encounter (Signed)
We will try Trazodone. Prescription sent to pharmacy.

## 2018-04-12 NOTE — Telephone Encounter (Signed)
The sleeping medicine is not working and is giving her migraines would like [facility] [place]

## 2018-04-12 NOTE — Telephone Encounter (Signed)
Pt aware rx sent into pharmacy. 

## 2018-04-23 DIAGNOSIS — R82998 Other abnormal findings in urine: Secondary | ICD-10-CM | POA: Diagnosis not present

## 2018-04-23 DIAGNOSIS — N821 Other female urinary-genital tract fistulae: Secondary | ICD-10-CM | POA: Diagnosis not present

## 2018-05-02 ENCOUNTER — Ambulatory Visit: Payer: BLUE CROSS/BLUE SHIELD | Admitting: Family

## 2018-06-13 ENCOUNTER — Encounter: Payer: Self-pay | Admitting: Family

## 2018-06-13 ENCOUNTER — Ambulatory Visit: Payer: BLUE CROSS/BLUE SHIELD | Admitting: Family

## 2018-06-13 VITALS — BP 138/86 | HR 65 | Temp 98.1°F | Ht 64.0 in | Wt 222.2 lb

## 2018-06-13 DIAGNOSIS — T782XXD Anaphylactic shock, unspecified, subsequent encounter: Secondary | ICD-10-CM

## 2018-06-13 DIAGNOSIS — I10 Essential (primary) hypertension: Secondary | ICD-10-CM

## 2018-06-13 DIAGNOSIS — F5101 Primary insomnia: Secondary | ICD-10-CM | POA: Diagnosis not present

## 2018-06-13 MED ORDER — EPINEPHRINE 0.3 MG/0.3ML IJ SOAJ: 0.3000 mg | Freq: Once | INTRAMUSCULAR | 2 refills | Status: AC

## 2018-06-13 MED ORDER — CARVEDILOL 12.5 MG PO TABS: 12.5000 mg | ORAL_TABLET | Freq: Two times a day (BID) | ORAL | 3 refills | Status: DC

## 2018-06-13 MED ORDER — ZOLPIDEM TARTRATE 10 MG PO TABS: 10.0000 mg | ORAL_TABLET | Freq: Every evening | ORAL | 3 refills | Status: DC | PRN

## 2018-06-13 NOTE — Patient Instructions (Signed)

## 2018-06-13 NOTE — Progress Notes (Signed)
Subjective:    Patient ID: Ashley Bradford, female    DOB: 04/21/1971, 47 y.o.   MRN: [id number]  Chief Complaint  Patient presents with  . Hypertension    pt here today c/o elevated BP since switching from bystolic to Carvedilol due to insurance   PT presents to the office today to check BP. States her Bystolic was changed per insurance to metoprolol. She reports the metoprolol made her "feel bad" and dizzy. She was then switched to Coreg 3.125 mg BID. She states her BP has been running 160'-180's/104.   She was worried about this and took one of her Bystolic's last night.   Pt states she was eating Honey 2-3 weeks ago and had anaphylaxis. Requesting refill on Epi.  Hypertension  This is a chronic problem. The current episode started more than 1 year ago. The problem has been rapidly worsening since onset. The problem is uncontrolled. Associated symptoms include headaches and malaise/fatigue. Pertinent negatives include no peripheral edema or shortness of breath. The current treatment provides moderate improvement. There is no history of kidney disease, CAD/MI, CVA or heart failure.  Insomnia  Primary symptoms: difficulty falling asleep, frequent awakening, malaise/fatigue.  The current episode started more than one year. The onset quality is gradual. The problem occurs intermittently. The symptoms are aggravated by anxiety. The treatment provided moderate relief.      Review of Systems  Constitutional: Positive for malaise/fatigue.  Respiratory: Negative for shortness of breath.   Neurological: Positive for headaches.  Psychiatric/Behavioral: The patient has insomnia.   All other systems reviewed and are negative.      Objective:   Physical Exam  Constitutional: She is oriented to person, place, and time. She appears well-developed and well-nourished. No distress.  HENT:  Head: Normocephalic.  Eyes: Pupils are equal, round, and reactive to light.  Neck: Normal range of motion.  Neck supple. No thyromegaly present.  Cardiovascular: Normal rate, regular rhythm, normal heart sounds and intact distal pulses.  No murmur heard. Pulmonary/Chest: Effort normal and breath sounds normal. No respiratory distress. She has no wheezes.  Abdominal: Soft. Bowel sounds are normal. She exhibits no distension. There is no tenderness.  Musculoskeletal: Normal range of motion. She exhibits no edema or tenderness.  Neurological: She is alert and oriented to person, place, and time. She has normal reflexes. No cranial nerve deficit.  Skin: Skin is warm and dry.  Psychiatric: She has a normal mood and affect. Her behavior is normal. Judgment and thought content normal.  Vitals reviewed.     BP 138/86   Pulse 65   Temp 98.1 F (36.7 C) (Oral)   Ht 5\' 4"  (1.626 m)   Wt 222 lb 4 oz (100.8 kg)   LMP 11/23/2017 (Approximate)   BMI 38.15 kg/m      Assessment & Plan:  Ashley Bradford comes in today with chief complaint of Hypertension (pt here today c/o elevated BP since switching from bystolic to Carvedilol due to insurance)   Diagnosis and orders addressed:  1. Essential hypertension Will increase Coreg to 12.5 mg BID from 3.125 mg  Continue to monitor BP RTO in 2 weeks  - carvedilol (COREG) 12.5 MG tablet; Take 1 tablet (12.5 mg total) by mouth 2 (two) times daily with a meal.  Dispense: 60 tablet; Refill: 3  2. Primary insomnia - zolpidem (AMBIEN) 10 MG tablet; Take 1 tablet (10 mg total) by mouth at bedtime as needed for sleep.  Dispense: 30 tablet; Refill: 3  3. Anaphylaxis, subsequent encounter Avoid Honey and Peanuts  - EPINEPHrine 0.3 mg/0.3 mL IJ SOAJ injection; Inject 0.3 mLs (0.3 mg total) into the muscle once for 1 dose.  Dispense: 2 Device; Refill: 2   Health Maintenance reviewed Diet and exercise encouraged  Follow up plan: 2 weeks to recheck HTN  Jannifer Rodney, FNP

## 2018-07-02 ENCOUNTER — Ambulatory Visit: Payer: BLUE CROSS/BLUE SHIELD | Admitting: Family

## 2018-07-04 ENCOUNTER — Ambulatory Visit: Payer: BLUE CROSS/BLUE SHIELD | Admitting: Family

## 2018-07-04 ENCOUNTER — Encounter: Payer: Self-pay | Admitting: Family

## 2018-07-04 ENCOUNTER — Telehealth: Payer: Self-pay | Admitting: Family

## 2018-07-04 VITALS — BP 148/91 | HR 63 | Temp 97.2°F | Ht 64.0 in | Wt 224.0 lb

## 2018-07-04 DIAGNOSIS — F908 Attention-deficit hyperactivity disorder, other type: Secondary | ICD-10-CM | POA: Diagnosis not present

## 2018-07-04 DIAGNOSIS — F411 Generalized anxiety disorder: Secondary | ICD-10-CM

## 2018-07-04 DIAGNOSIS — I10 Essential (primary) hypertension: Secondary | ICD-10-CM

## 2018-07-04 DIAGNOSIS — G44209 Tension-type headache, unspecified, not intractable: Secondary | ICD-10-CM

## 2018-07-04 MED ORDER — METHYLPHENIDATE HCL ER (OSM) 36 MG PO TBCR: 36.0000 mg | EXTENDED_RELEASE_TABLET | Freq: Every day | ORAL | 0 refills | Status: DC

## 2018-07-04 MED ORDER — NEBIVOLOL HCL 10 MG PO TABS: 10.0000 mg | ORAL_TABLET | Freq: Every day | ORAL | 1 refills | Status: DC

## 2018-07-04 NOTE — Progress Notes (Addendum)
Subjective:    Patient ID: Ashley Bradford, female    DOB: 1970-12-03, 47 y.o.   MRN: [id number]  Chief Complaint  Patient presents with  . Hypertension   Pt presents to the office today for elevated hypertension. PT states she has been on Bystolic and her hypertension was controlled. However, in July her insurance stopped covering it. She was given metoprolol and could not tolerate it because she felt "werid and sick". She then was placed on Coreg and her BP has not been controlled and having headaches.  She does state she is under a great deal of stress at work with her boss.  Hypertension  This is a chronic problem. The current episode started more than 1 year ago. The problem has been waxing and waning since onset. The problem is uncontrolled. Associated symptoms include anxiety, headaches and neck pain. Pertinent negatives include no palpitations, peripheral edema or shortness of breath. Risk factors for coronary artery disease include dyslipidemia and obesity. The current treatment provides no improvement. There is no history of CAD/MI, CVA or heart failure.  Headache   This is a recurrent problem. The current episode started 1 to 4 weeks ago. The problem occurs constantly. The problem has been waxing and waning. The pain is located in the left unilateral region. The pain is moderate. Associated symptoms include neck pain. She has tried Excedrin and NSAIDs for the symptoms. The treatment provided mild relief. Her past medical history is significant for hypertension.  Anxiety  Presents for follow-up visit. Symptoms include excessive worry, irritability and nervous/anxious behavior. Patient reports no palpitations or shortness of breath. Symptoms occur most days. The severity of symptoms is moderate. The quality of sleep is good.    ADHD Pt states her anxiety is worse and having trouble focusing at work. She is currently in the process of filing harassment charges against her boss.      Review of Systems  Constitutional: Positive for irritability.  HENT: Negative.   Eyes: Negative.   Respiratory: Negative.  Negative for shortness of breath.   Cardiovascular: Negative.  Negative for palpitations.  Gastrointestinal: Negative.   Endocrine: Negative.   Genitourinary: Negative.   Musculoskeletal: Positive for neck pain.  Neurological: Positive for headaches.  Hematological: Negative.   Psychiatric/Behavioral: The patient is nervous/anxious.   All other systems reviewed and are negative.      Objective:   Physical Exam  Constitutional: She is oriented to person, place, and time. She appears well-developed and well-nourished. No distress.  HENT:  Head: Normocephalic and atraumatic.  Right Ear: External ear normal.  Left Ear: External ear normal.  Mouth/Throat: Oropharynx is clear and moist.  Eyes: Pupils are equal, round, and reactive to light.  Neck: Normal range of motion. Neck supple. No thyromegaly present.  Cardiovascular: Normal rate, regular rhythm, normal heart sounds and intact distal pulses.  No murmur heard. Pulmonary/Chest: Effort normal and breath sounds normal. No respiratory distress. She has no wheezes.  Abdominal: Soft. Bowel sounds are normal. She exhibits no distension. There is no tenderness.  Musculoskeletal: Normal range of motion. She exhibits no edema or tenderness.  Neurological: She is alert and oriented to person, place, and time. She has normal reflexes. No cranial nerve deficit.  Skin: Skin is warm and dry.  Psychiatric: Her behavior is normal. Judgment and thought content normal. Her mood appears anxious.  Vitals reviewed.     BP (!) 157/90   Pulse 69   Temp (!) 97.2 F (36.2 C) (Oral)  Ht 5\' 4"  (1.626 m)   Wt 224 lb (101.6 kg)   LMP 11/23/2017 (Approximate)   BMI 38.45 kg/m      Assessment & Plan:  Ashley Bradford comes in today with chief complaint of Hypertension   Diagnosis and orders addressed:  1. Essential  hypertension Will try to restart Bystolic 10 mg and stop Coreg -Dash diet information given -Exercise encouraged - Stress Management  -Continue current meds - nebivolol (BYSTOLIC) 10 MG tablet; Take 1 tablet (10 mg total) by mouth daily.  Dispense: 90 tablet; Refill: 1   2. Morbid obesity ([facility])   3. ADHD, adult residual type Meds as prescribed Behavior modification as needed Follow-up for recheck in 2 months  - methylphenidate 36 MG PO CR tablet; Take 1 tablet (36 mg total) by mouth daily.  Dispense: 30 tablet; Refill: 0 - methylphenidate 36 MG PO CR tablet; Take 1 tablet (36 mg total) by mouth daily.  Dispense: 30 tablet; Refill: 0 - methylphenidate 36 MG PO CR tablet; Take 1 tablet (36 mg total) by mouth daily.  Dispense: 30 tablet; Refill: 0  4. Tension-type headache, not intractable, unspecified chronicity pattern  5. GAD (generalized anxiety disorder)      Jannifer Rodney, FNP

## 2018-07-04 NOTE — Patient Instructions (Signed)

## 2018-07-04 NOTE — Telephone Encounter (Signed)
appt scheduled Pt notified 

## 2018-07-04 NOTE — Addendum Note (Signed)
Addended by: Jannifer Rodney A on: 07/04/2018 06:30 PM   Modules accepted: Orders, Level of Service

## 2018-07-11 ENCOUNTER — Encounter: Payer: Self-pay | Admitting: Family

## 2018-07-12 ENCOUNTER — Ambulatory Visit: Payer: BLUE CROSS/BLUE SHIELD | Admitting: Family

## 2018-07-18 ENCOUNTER — Telehealth: Payer: Self-pay | Admitting: Family

## 2018-07-18 NOTE — Telephone Encounter (Signed)
Please advise concerning excuse note. 

## 2018-07-22 NOTE — Telephone Encounter (Signed)
NA, voicemail full 

## 2018-07-22 NOTE — Telephone Encounter (Signed)
What type of note does she need? A note stating she has high blood pressure. If so, I can write that.

## 2018-07-26 NOTE — Telephone Encounter (Signed)
No response. Note filed. 

## 2018-07-30 ENCOUNTER — Encounter: Payer: Self-pay | Admitting: *Deleted

## 2018-08-01 ENCOUNTER — Encounter: Payer: Self-pay | Admitting: Family

## 2018-10-09 ENCOUNTER — Ambulatory Visit: Payer: BLUE CROSS/BLUE SHIELD | Admitting: Family

## 2018-10-09 ENCOUNTER — Other Ambulatory Visit: Payer: Self-pay | Admitting: Family

## 2018-10-09 ENCOUNTER — Encounter: Payer: Self-pay | Admitting: Family

## 2018-10-09 VITALS — BP 118/81 | HR 72 | Temp 96.8°F | Ht 64.0 in | Wt 233.8 lb

## 2018-10-09 DIAGNOSIS — L259 Unspecified contact dermatitis, unspecified cause: Secondary | ICD-10-CM | POA: Diagnosis not present

## 2018-10-09 DIAGNOSIS — R350 Frequency of micturition: Secondary | ICD-10-CM

## 2018-10-09 DIAGNOSIS — D509 Iron deficiency anemia, unspecified: Secondary | ICD-10-CM | POA: Diagnosis not present

## 2018-10-09 DIAGNOSIS — J4520 Mild intermittent asthma, uncomplicated: Secondary | ICD-10-CM | POA: Diagnosis not present

## 2018-10-09 DIAGNOSIS — F908 Attention-deficit hyperactivity disorder, other type: Secondary | ICD-10-CM

## 2018-10-09 DIAGNOSIS — F5101 Primary insomnia: Secondary | ICD-10-CM

## 2018-10-09 DIAGNOSIS — I10 Essential (primary) hypertension: Secondary | ICD-10-CM

## 2018-10-09 DIAGNOSIS — K219 Gastro-esophageal reflux disease without esophagitis: Secondary | ICD-10-CM

## 2018-10-09 DIAGNOSIS — F411 Generalized anxiety disorder: Secondary | ICD-10-CM

## 2018-10-09 LAB — URINALYSIS, COMPLETE
Bilirubin, UA: NEGATIVE
Glucose, UA: NEGATIVE
Ketones, UA: NEGATIVE
Leukocytes, UA: NEGATIVE
Nitrite, UA: NEGATIVE
PROTEIN UA: NEGATIVE
Specific Gravity, UA: 1.015 (ref 1.005–1.030)
Urobilinogen, Ur: 0.2 mg/dL (ref 0.2–1.0)
pH, UA: 5.5 (ref 5.0–7.5)

## 2018-10-09 LAB — MICROSCOPIC EXAMINATION: Renal Epithel, UA: NONE SEEN /hpf

## 2018-10-09 MED ORDER — METHYLPHENIDATE HCL ER (OSM) 36 MG PO TBCR: 36.0000 mg | EXTENDED_RELEASE_TABLET | Freq: Every day | ORAL | 0 refills | Status: DC

## 2018-10-09 MED ORDER — PANTOPRAZOLE SODIUM 40 MG PO TBEC: 40.0000 mg | DELAYED_RELEASE_TABLET | Freq: Two times a day (BID) | ORAL | 1 refills | Status: DC

## 2018-10-09 MED ORDER — IBUPROFEN 800 MG PO TABS: 800.0000 mg | ORAL_TABLET | Freq: Two times a day (BID) | ORAL | 3 refills | Status: DC | PRN

## 2018-10-09 MED ORDER — ESCITALOPRAM OXALATE 20 MG PO TABS: 20.0000 mg | ORAL_TABLET | Freq: Every day | ORAL | 5 refills | Status: DC

## 2018-10-09 MED ORDER — TRIAMCINOLONE ACETONIDE 0.5 % EX OINT: 1.0000 "application " | TOPICAL_OINTMENT | Freq: Two times a day (BID) | CUTANEOUS | 0 refills | Status: DC

## 2018-10-09 MED ORDER — METHYLPHENIDATE HCL 20 MG PO TABS: 20.0000 mg | ORAL_TABLET | Freq: Two times a day (BID) | ORAL | 0 refills | Status: DC

## 2018-10-09 MED ORDER — NEBIVOLOL HCL 10 MG PO TABS: 10.0000 mg | ORAL_TABLET | Freq: Every day | ORAL | 1 refills | Status: DC

## 2018-10-09 MED ORDER — ZOLPIDEM TARTRATE 10 MG PO TABS: 10.0000 mg | ORAL_TABLET | Freq: Every evening | ORAL | 3 refills | Status: DC | PRN

## 2018-10-09 NOTE — Patient Instructions (Signed)
Contact Dermatitis  Dermatitis is redness, soreness, and swelling (inflammation) of the skin. Contact dermatitis is a reaction to certain substances that touch the skin. Many different substances can cause contact dermatitis. There are two types of contact dermatitis:   Irritant contact dermatitis. This type is caused by something that irritates your skin, such as having dry hands from washing them too often with soap. This type does not require previous exposure to the substance for a reaction to occur. This is the most common type.   Allergic contact dermatitis. This type is caused by a substance that you are allergic to, such as poison ivy. This type occurs when you have been exposed to the substance (allergen) and develop a sensitivity to it. Dermatitis may develop soon after your first exposure to the allergen, or it may not develop until the next time you are exposed and every time thereafter.  What are the causes?  Irritant contact dermatitis is most commonly caused by exposure to:   Makeup.   Soaps.   Detergents.   Bleaches.   Acids.   Metal salts, such as nickel.  Allergic contact dermatitis is most commonly caused by exposure to:   Poisonous plants.   Chemicals.   Jewelry.   Latex.   Medicines.   Preservatives in products, such as clothing.  What increases the risk?  You are more likely to develop this condition if you have:   A job that exposes you to irritants or allergens.   Certain medical conditions, such as asthma or eczema.  What are the signs or symptoms?  Symptoms of this condition may occur on your body anywhere the irritant has touched you or is touched by you.   Symptoms include:  ? Dryness or flaking.  ? Redness.  ? Cracks.  ? Itching.  ? Pain or a burning feeling.  ? Blisters.  ? Drainage of small amounts of blood or clear fluid from skin cracks.  With allergic contact dermatitis, there may also be swelling in areas such as the eyelids, mouth, or genitals.  How is this  diagnosed?  This condition is diagnosed with a medical history and physical exam.   A patch skin test may be performed to help determine the cause.   If the condition is related to your job, you may need to see an occupational medicine specialist.  How is this treated?  This condition is treated by checking for the cause of the reaction and protecting your skin from further contact. Treatment may also include:   Steroid creams or ointments. Oral steroid medicines may be needed in more severe cases.   Antibiotic medicines or antibacterial ointments, if a skin infection is present.   Antihistamine lotion or an antihistamine taken by mouth to ease itching.   A bandage (dressing).  Follow these instructions at home:  Skin care   Moisturize your skin as needed.   Apply cool compresses to the affected areas.   Try applying baking soda paste to your skin. Stir water into baking soda until it reaches a paste-like consistency.   Do not scratch your skin, and avoid friction to the affected area.   Avoid the use of soaps, perfumes, and dyes.  Medicines   Take or apply over-the-counter and prescription medicines only as told by your health care provider.   If you were prescribed an antibiotic medicine, take or apply the antibiotic as told by your health care provider. Do not stop using the antibiotic even if your condition   improves.  Bathing   Try taking a bath with:  ? Epsom salts. Follow the instructions on the packaging. You can get these at your local pharmacy or grocery store.  ? Baking soda. Pour a small amount into the bath as directed by your health care provider.  ? Colloidal oatmeal. Follow the instructions on the packaging. You can get this at your local pharmacy or grocery store.   Bathe less frequently, such as every other day.   Bathe in lukewarm water. Avoid using hot water.  Bandage care   If you were given a bandage (dressing), change it as told by your health care provider.   Wash your hands  with soap and water before and after you change your dressing. If soap and water are not available, use hand sanitizer.  General instructions   Avoid the substance that caused your reaction. If you do not know what caused it, keep a journal to try to track what caused it. Write down:  ? What you eat.  ? What cosmetic products you use.  ? What you drink.  ? What you wear in the affected area. This includes jewelry.   Check the affected areas every day for signs of infection. Check for:  ? More redness, swelling, or pain.  ? More fluid or blood.  ? Warmth.  ? Pus or a bad smell.   Keep all follow-up visits as told by your health care provider. This is important.  Contact a health care provider if:   Your condition does not improve with treatment.   Your condition gets worse.   You have signs of infection such as swelling, tenderness, redness, soreness, or warmth in the affected area.   You have a fever.   You have new symptoms.  Get help right away if:   You have a severe headache, neck pain, or neck stiffness.   You vomit.   You feel very sleepy.   You notice red streaks coming from the affected area.   Your bone or joint underneath the affected area becomes painful after the skin has healed.   The affected area turns darker.   You have difficulty breathing.  Summary   Dermatitis is redness, soreness, and swelling (inflammation) of the skin. Contact dermatitis is a reaction to certain substances that touch the skin.   Symptoms of this condition may occur on your body anywhere the irritant has touched you or is touched by you.   This condition is treated by figuring out what caused the reaction and protecting your skin from further contact. Treatment may also include medicines and skin care.   Avoid the substance that caused your reaction. If you do not know what caused it, keep a journal to try to track what caused it.   Contact a health care provider if your condition gets worse or you have signs  of infection such as swelling, tenderness, redness, soreness, or warmth in the affected area.  This information is not intended to replace advice given to you by your health care provider. Make sure you discuss any questions you have with your health care provider.  Document Released: 09/08/2000 Document Revised: 03/27/2018 Document Reviewed: 03/27/2018  [employer]vier Interactive Patient Education  2019 [employer]

## 2018-10-09 NOTE — Addendum Note (Signed)
Addended by: Jannifer Rodney A on: 10/09/2018 04:19 PM   Modules accepted: Orders

## 2018-10-09 NOTE — Progress Notes (Addendum)
Subjective:    Patient ID: Ashley Bradford, female    DOB: 1970/10/07, 48 y.o.   MRN: [id number]  Chief Complaint  Patient presents with  . Rash    Rash  This is a new problem. The current episode started in the past 7 days. The problem has been gradually worsening since onset. The affected locations include the right arm. The rash is characterized by redness and itchiness. It is unknown if there was an exposure to a precipitant. Pertinent negatives include no congestion, cough, fatigue, fever, shortness of breath or sore throat. Past treatments include anti-itch cream and topical steroids. The treatment provided mild relief. Her past medical history is significant for asthma.  Anxiety  Presents for follow-up visit. Symptoms include excessive worry, irritability, nervous/anxious behavior and restlessness. Patient reports no shortness of breath. Symptoms occur most days. The severity of symptoms is moderate.   Her past medical history is significant for anemia and asthma.  Hypertension  This is a chronic problem. The current episode started more than 1 year ago. The problem has been resolved since onset. The problem is controlled. Associated symptoms include anxiety and malaise/fatigue. Pertinent negatives include no peripheral edema or shortness of breath. The current treatment provides moderate improvement.  Asthma  There is no cough or shortness of breath. This is a chronic problem. The current episode started more than 1 year ago. The problem occurs intermittently. Associated symptoms include malaise/fatigue. Pertinent negatives include no fever, heartburn or sore throat. She reports moderate improvement on treatment. Her past medical history is significant for asthma.  Anemia  Presents for follow-up visit. Symptoms include bruises/bleeds easily and malaise/fatigue. There has been no fever.  Gastroesophageal Reflux  She reports no coughing, no heartburn or no sore throat. This is a chronic  problem. The current episode started more than 1 year ago. The problem occurs occasionally. The problem has been waxing and waning. The symptoms are aggravated by certain foods. Pertinent negatives include no fatigue. Risk factors include obesity. She has tried a PPI for the symptoms. The treatment provided moderate relief.  Urinary Frequency   This is a new problem. The current episode started in the past 7 days. The problem occurs intermittently. The problem has been waxing and waning. The patient is experiencing no pain. Associated symptoms include frequency.  ADHD PT taking methylphenidate 36 mg daily. States this is helping her stay focused at work.    Review of Systems  Constitutional: Positive for irritability and malaise/fatigue. Negative for fatigue and fever.  HENT: Negative for congestion and sore throat.   Respiratory: Negative for cough and shortness of breath.   Gastrointestinal: Negative for heartburn.  Genitourinary: Positive for frequency.  Skin: Positive for rash.  Hematological: Bruises/bleeds easily.  Psychiatric/Behavioral: The patient is nervous/anxious.   All other systems reviewed and are negative.      Objective:   Physical Exam Vitals signs reviewed.  Constitutional:      General: She is not in acute distress.    Appearance: She is well-developed.  Neck:     Musculoskeletal: Normal range of motion and neck supple.     Thyroid: No thyromegaly.  Cardiovascular:     Rate and Rhythm: Normal rate and regular rhythm.     Heart sounds: Normal heart sounds. No murmur.  Pulmonary:     Effort: Pulmonary effort is normal. No respiratory distress.     Breath sounds: Normal breath sounds. No wheezing.  Abdominal:     General: Bowel sounds are  normal. There is no distension.     Palpations: Abdomen is soft.     Tenderness: There is no abdominal tenderness.  Musculoskeletal: Normal range of motion.        General: No tenderness.  Skin:    General: Skin is warm and  dry.     Findings: Erythema (papule, pruritic rash) present.       Neurological:     Mental Status: She is alert and oriented to person, place, and time.     Cranial Nerves: No cranial nerve deficit.     Deep Tendon Reflexes: Reflexes are normal and symmetric.  Psychiatric:        Behavior: Behavior normal.        Thought Content: Thought content normal.        Judgment: Judgment normal.       BP 118/81   Pulse 72   Temp (!) 96.8 F (36 C) (Oral)   Ht 5\' 4"  (1.626 m)   Wt 233 lb 12.8 oz (106.1 kg)   LMP 11/23/2017 (Approximate)   BMI 40.13 kg/m      Assessment & Plan:  Ashley Bradford comes in today with chief complaint of Rash (on forearm with itching)   Diagnosis and orders addressed:  1. Contact dermatitis, unspecified contact dermatitis type, unspecified trigger -Do not scratch  Cool compresses as needed - triamcinolone ointment (KENALOG) 0.5 %; Apply 1 application topically 2 (two) times daily.  Dispense: 30 g; Refill: 0  2. Essential hypertension - nebivolol (BYSTOLIC) 10 MG tablet; Take 1 tablet (10 mg total) by mouth daily.  Dispense: 90 tablet; Refill: 1  3. Mild intermittent asthma without complication  4. Morbid obesity (HCC)  5. ADHD, adult residual type Meds as prescribed Behavior modification as needed Follow-up for recheck in 3 months - methylphenidate (RITALIN) 20 MG tablet; Take 1 tablet (20 mg total) by mouth 2 (two) times daily.  Dispense: 30 tablet; Refill: 0 - methylphenidate 36 MG PO CR tablet; Take 1 tablet (36 mg total) by mouth daily.  Dispense: 30 tablet; Refill: 0 - methylphenidate 36 MG PO CR tablet; Take 1 tablet (36 mg total) by mouth daily.  Dispense: 30 tablet; Refill: 0 - methylphenidate 36 MG PO CR tablet; Take 1 tablet (36 mg total) by mouth daily.  Dispense: 30 tablet; Refill: 0  6. GAD (generalized anxiety disorder) - escitalopram (LEXAPRO) 20 MG tablet; Take 1 tablet (20 mg total) by mouth daily.  Dispense: 30 tablet; Refill:  5  7. Gastroesophageal reflux disease without esophagitis - pantoprazole (PROTONIX) 40 MG tablet; Take 1 tablet (40 mg total) by mouth 2 (two) times daily.  Dispense: 90 tablet; Refill: 1  8. Iron deficiency anemia, unspecified iron deficiency anemia type  10. Urinary frequency - Urinalysis, Complete  Labs pending Health Maintenance reviewed Diet and exercise encouraged  Follow up plan: 3 months   Jannifer Rodney, FNP

## 2018-10-10 LAB — ANEMIA PROFILE B
Basophils Absolute: 0.1 10*3/uL (ref 0.0–0.2)
Basos: 1 %
EOS (ABSOLUTE): 0.1 10*3/uL (ref 0.0–0.4)
Eos: 1 %
FERRITIN: 37 ng/mL (ref 15–150)
Folate: 17.1 ng/mL (ref >3.0)
Hematocrit: 39.1 % (ref 34.0–46.6)
Hemoglobin: 13.3 g/dL (ref 11.1–15.9)
Immature Grans (Abs): 0 10*3/uL (ref 0.0–0.1)
Immature Granulocytes: 0 %
Iron Saturation: 21 % (ref 15–55)
Iron: 70 ug/dL (ref 27–159)
Lymphocytes Absolute: 2.3 10*3/uL (ref 0.7–3.1)
Lymphs: 21 %
MCH: 31 pg (ref 26.6–33.0)
MCHC: 34 g/dL (ref 31.5–35.7)
MCV: 91 fL (ref 79–97)
MONOCYTES: 7 %
Monocytes Absolute: 0.7 10*3/uL (ref 0.1–0.9)
Neutrophils Absolute: 7.5 10*3/uL — ABNORMAL HIGH (ref 1.4–7.0)
Neutrophils: 70 %
Platelets: 281 10*3/uL (ref 150–450)
RBC: 4.29 x10E6/uL (ref 3.77–5.28)
RDW: 13 % (ref 11.7–15.4)
Retic Ct Pct: 2.1 % (ref 0.6–2.6)
TIBC: 330 ug/dL (ref 250–450)
UIBC: 260 ug/dL (ref 131–425)
Vitamin B-12: 578 pg/mL (ref 232–1245)
WBC: 10.7 10*3/uL (ref 3.4–10.8)

## 2018-10-10 LAB — CMP14+EGFR
A/G RATIO: 2 (ref 1.2–2.2)
ALT: 19 IU/L (ref 0–32)
AST: 14 IU/L (ref 0–40)
Albumin: 4.5 g/dL (ref 3.5–5.5)
Alkaline Phosphatase: 87 IU/L (ref 39–117)
BILIRUBIN TOTAL: 0.3 mg/dL (ref 0.0–1.2)
BUN/Creatinine Ratio: 16 (ref 9–23)
BUN: 13 mg/dL (ref 6–24)
CHLORIDE: 104 mmol/L (ref 96–106)
CO2: 22 mmol/L (ref 20–29)
Calcium: 9.8 mg/dL (ref 8.7–10.2)
Creatinine, Ser: 0.83 mg/dL (ref 0.57–1.00)
GFR calc Af Amer: 97 mL/min/{1.73_m2} (ref >59)
GFR calc non Af Amer: 84 mL/min/{1.73_m2} (ref >59)
Globulin, Total: 2.2 g/dL (ref 1.5–4.5)
Glucose: 86 mg/dL (ref 65–99)
Potassium: 4.2 mmol/L (ref 3.5–5.2)
Sodium: 140 mmol/L (ref 134–144)
Total Protein: 6.7 g/dL (ref 6.0–8.5)

## 2018-10-21 ENCOUNTER — Other Ambulatory Visit: Payer: Self-pay

## 2018-10-22 ENCOUNTER — Other Ambulatory Visit: Payer: Self-pay | Admitting: *Deleted

## 2018-10-22 MED ORDER — EPINEPHRINE 0.3 MG/0.3ML IJ SOAJ: 0.3000 mg | Freq: Once | INTRAMUSCULAR | 0 refills | Status: DC | PRN

## 2018-11-18 ENCOUNTER — Other Ambulatory Visit: Payer: Self-pay | Admitting: Family

## 2019-01-29 ENCOUNTER — Telehealth: Payer: Self-pay | Admitting: Family

## 2019-06-18 ENCOUNTER — Telehealth: Payer: Self-pay | Admitting: *Deleted

## 2019-06-18 NOTE — Telephone Encounter (Signed)
Pt came in to inquire about patient assistance medication  Per pt, medication Bystolic, was sent to our office 05/08/2019 Called [facility] to confirm delivery Medication was not found Reordered thru [facility]   Reorder number [id number] Per representative, medication should be delivered in 7-10 days

## 2019-06-19 ENCOUNTER — Other Ambulatory Visit: Payer: Self-pay | Admitting: Family

## 2019-06-20 ENCOUNTER — Telehealth: Payer: Self-pay | Admitting: Family

## 2019-06-20 NOTE — Telephone Encounter (Signed)
What is the name of the medication? Vitamin D, Ergocalciferol, (DRISDOL) 1.25 MG (50000 UT) CAPS capsule   Have you contacted your pharmacy to request a refill? yes  Which pharmacy would you like this sent to? [facility] Pt is positive for COVID she cannot come in for ov or do labs. Pt denied televisit because she does not have insurance.    Patient notified that their request is being sent to the clinical staff for review and that they should receive a call once it is complete. If they do not receive a call within 24 hours they can check with their pharmacy or our office.

## 2019-06-23 MED ORDER — VITAMIN D (ERGOCALCIFEROL) 1.25 MG (50000 UNIT) PO CAPS: 50000.0000 [IU] | ORAL_CAPSULE | ORAL | 3 refills | Status: DC

## 2019-06-23 NOTE — Telephone Encounter (Signed)
Prescription sent to pharmacy.

## 2019-07-22 ENCOUNTER — Telehealth: Payer: Self-pay | Admitting: Family

## 2019-07-23 NOTE — Telephone Encounter (Signed)
Pt aware medicine at front desk. [facility] will not ship to pt's house. She will check to see if they will send to a small independent pharmacy, ie [facility] or [facility], if not we will continue as is

## 2019-10-29 ENCOUNTER — Telehealth: Payer: Self-pay

## 2019-10-29 NOTE — Telephone Encounter (Signed)
Patient called requesting her meds from patient assistance. She states that it is time to be refilled. Called assistance program and advised to send med. Advised patient we would call her when they were ready for pick up. Patient verbalized understanding

## 2020-03-04 IMAGING — CT CT ABD-PELV W/ CM
1 of 3 series · 13 of 32 positions shown, 18 images · IV contrast (OMNIPAQUE)
Comparison: None.

CLINICAL DATA: Recent hysterectomy and bilateral salpingectomy
(12/06/2017. Low pelvic pain and fever for several days.

EXAM:
CT ABDOMEN AND PELVIS WITH CONTRAST
TECHNIQUE: Multidetector CT imaging of the abdomen and pelvis was performed
using the standard protocol following bolus administration of
intravenous contrast.
CONTRAST:  100mL 5TQ48T-366 IOPAMIDOL (5TQ48T-366) INJECTION 61%

[Series 2: routine abdomen/pelvis with · axial · 0.92mm/px · z∈[+609,+1029]mm · 13 of 97 slices shown, 18 images]
[im 7/97  soft-tissue]
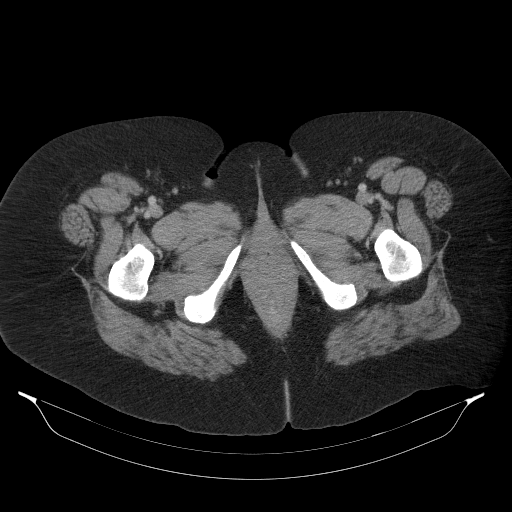
[im 7/97  bone]
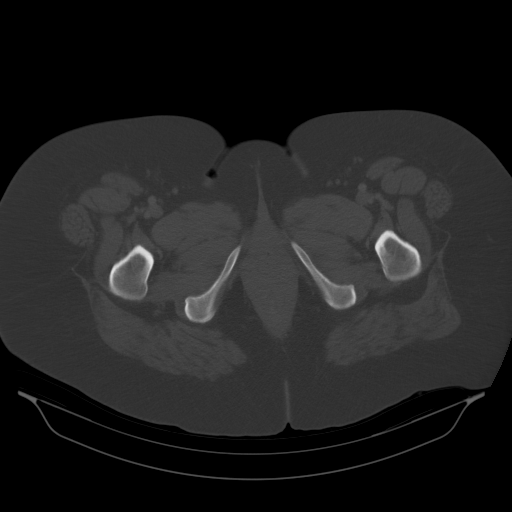
[im 13/97  soft-tissue]
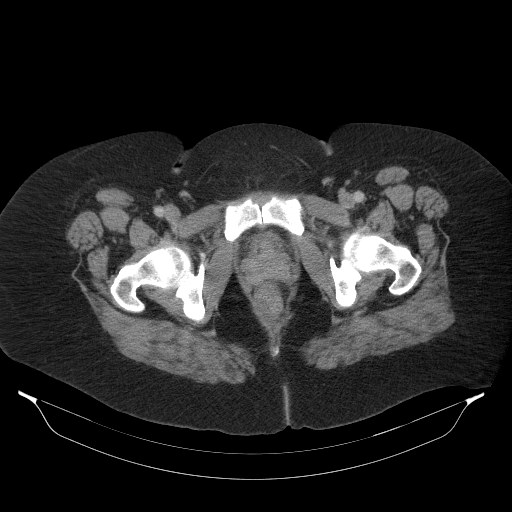
[im 25/97  soft-tissue]
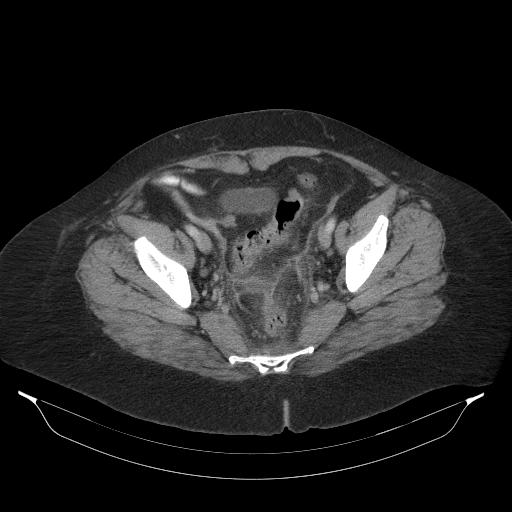
[im 31/97  soft-tissue]
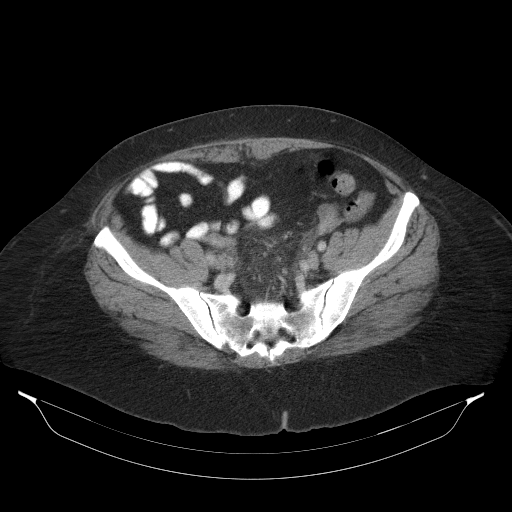
[im 37/97  soft-tissue]
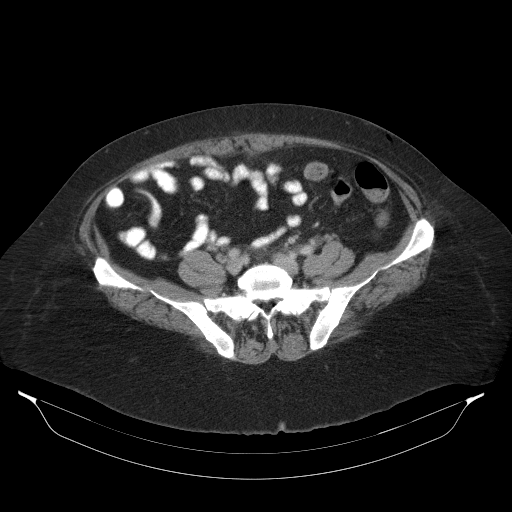
[im 43/97  soft-tissue]
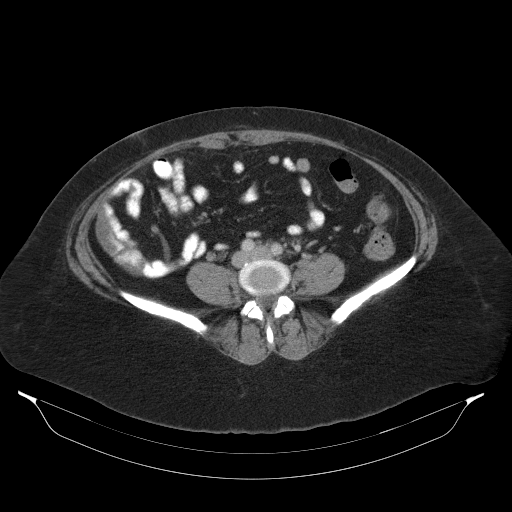
[im 55/97  soft-tissue]
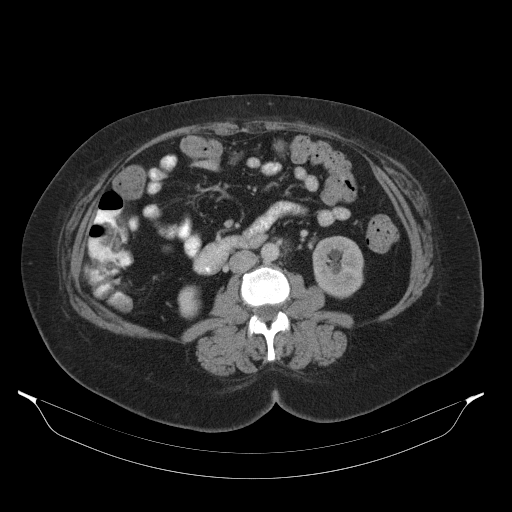
[im 61/97  soft-tissue]
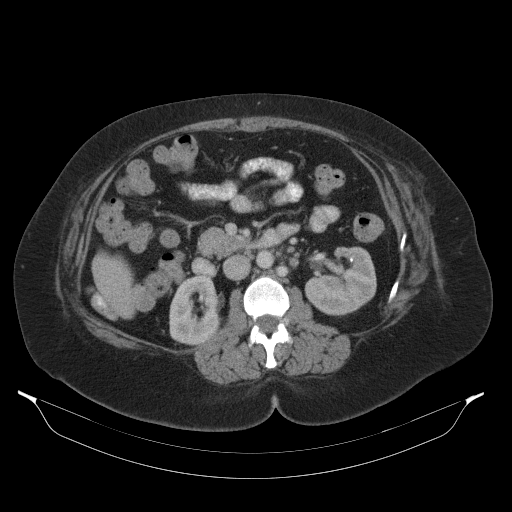
[im 67/97  soft-tissue]
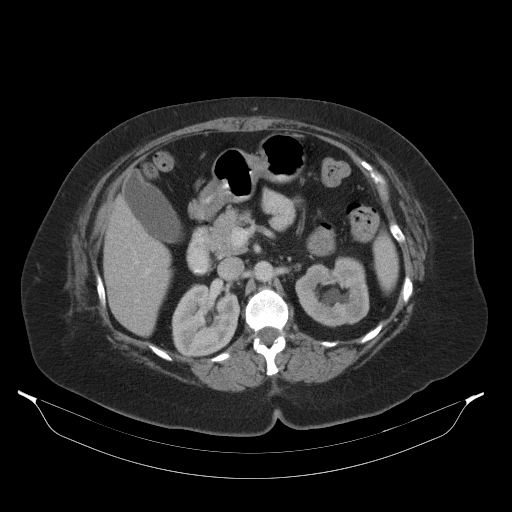
[im 67/97  bone]
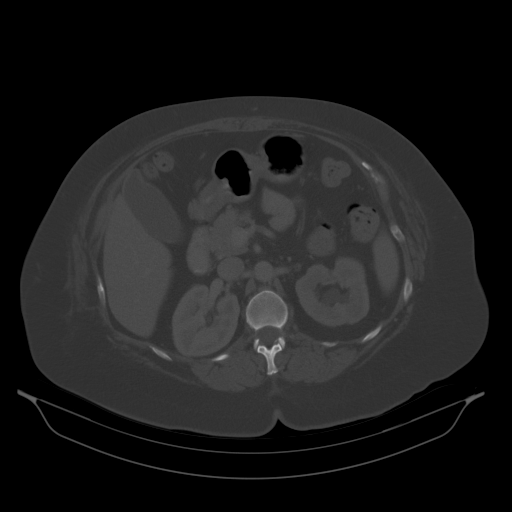
[im 73/97  soft-tissue]
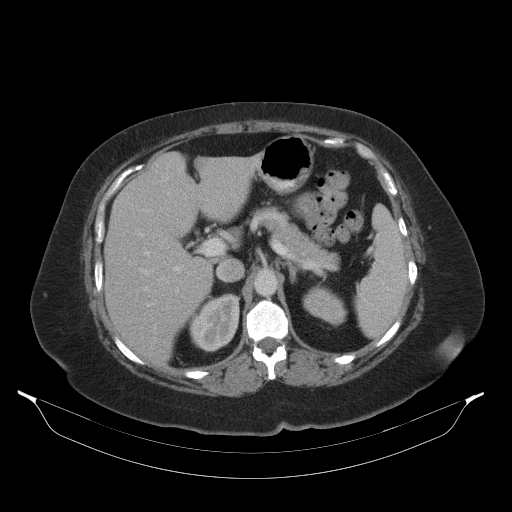
[im 73/97  lung]
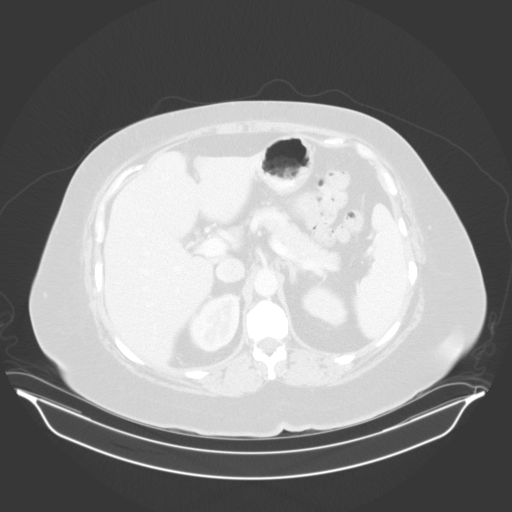
[im 79/97  lung]
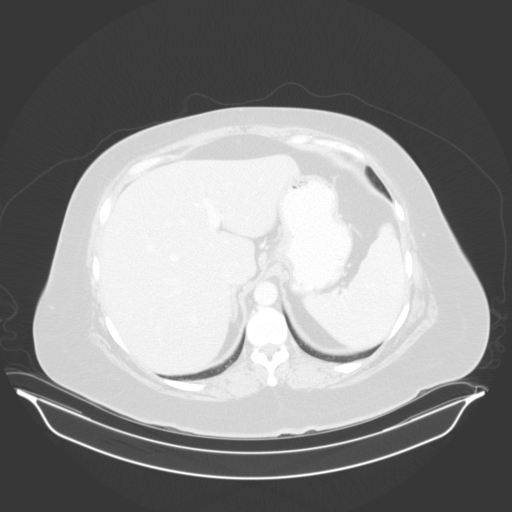
[im 85/97  soft-tissue]
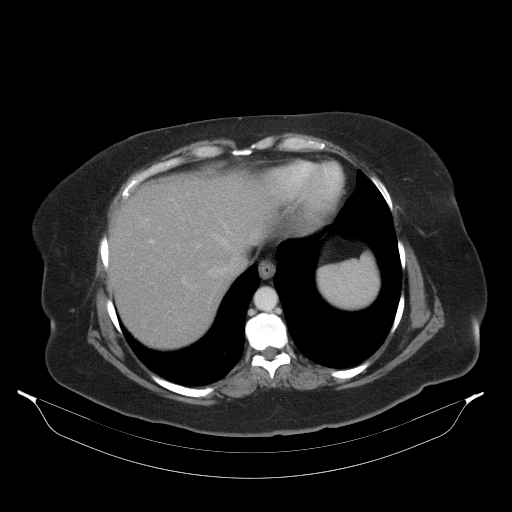
[im 85/97  lung]
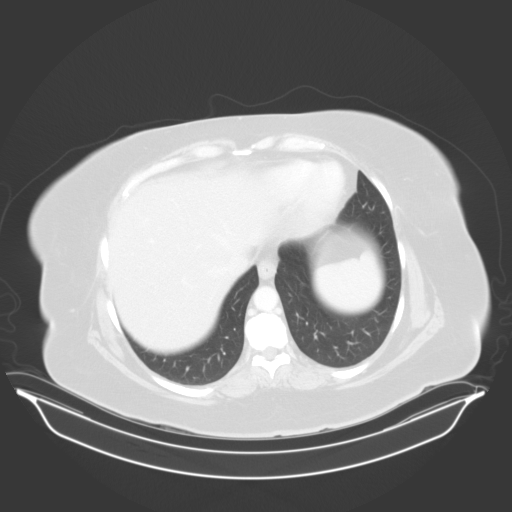
[im 91/97  soft-tissue]
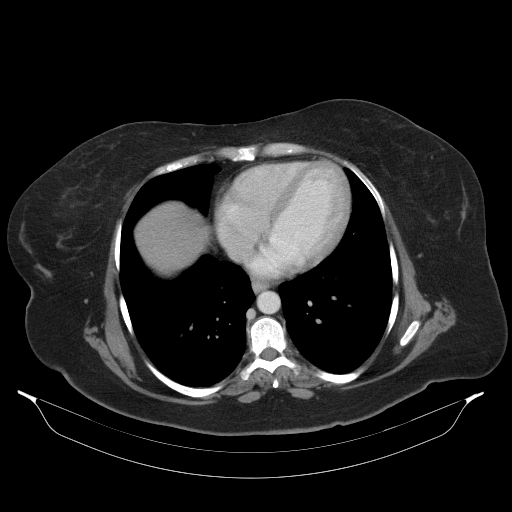
[im 91/97  lung]
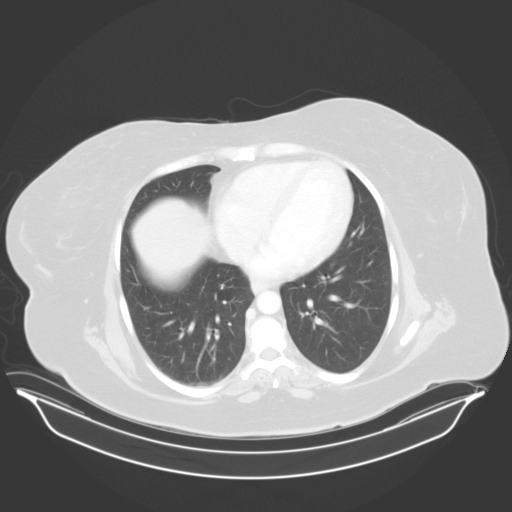

[13 of 32 positions shown; findings below may reference images not displayed]

FINDINGS: Lower chest: The lung bases are clear of acute process. No pleural
effusion or pulmonary lesions. The heart is normal in size. No
pericardial effusion. The distal esophagus and aorta are
unremarkable.

Hepatobiliary: Low-attenuation left hepatic lobe lesions are
consistent with benign hepatic cysts. No worrisome hepatic lesions
or intrahepatic biliary dilatation. There is a 10.5 mm gallstone
noted the gallbladder. No findings for acute cholecystitis. No
common bile duct dilatation.

Pancreas: No mass, inflammation or ductal dilatation.

Spleen: Normal size.  No focal lesions.

Adrenals/Urinary Tract: The adrenal glands are unremarkable.

Small bilateral renal calculi but no obstructing ureteral calculi or
bladder calculi. There is inflammation around both ureters in the
pelvis causing mild bilateral hydroureter and mild left-sided
hydronephrosis.

Mild inflammation surrounding the bladder. No bladder mass or
calculi.

Stomach/Bowel: Inflammatory changes surrounding the rectum and
sigmoid colon likely related to the recent surgery and adjacent
abscess. No obstructive findings. Diverticulosis of the sigmoid
colon is noted. The terminal ileum and appendix are normal.

Vascular/Lymphatic: Small scattered mesenteric and retroperitoneal
lymph nodes likely related to the inflammatory process and recent
surgery in the pelvis.

Reproductive: Status post hysterectomy. Both ovaries are still
present and appear normal.

Other: There is a rim enhancing fluid collection in the pelvis
consistent with an abscess and surrounding inflammation/inflammatory
phlegmon. The abscess measures maximum 6.5 cm.

Musculoskeletal: No significant bony findings.
IMPRESSION: 1. Status post hysterectomy with a 6.5 cm postoperative abscess and
surrounding inflammations/edema/inflammatory phlegmon.
2. Small scattered probable inflammatory abdominal and pelvic lymph
nodes.
3. Bilateral renal calculi.
4. Cholelithiasis.
5. Simple appearing bilateral renal cysts and hepatic cysts.
6. Predominantly left-sided hydroureteronephrosis probably due to
inflammation/edema involving the left ureter near the abscess.

## 2020-12-20 ENCOUNTER — Ambulatory Visit (INDEPENDENT_AMBULATORY_CARE_PROVIDER_SITE_OTHER): Payer: Self-pay | Admitting: Nurse Practitioner

## 2020-12-20 ENCOUNTER — Other Ambulatory Visit: Payer: Self-pay

## 2020-12-20 ENCOUNTER — Encounter: Payer: Self-pay | Admitting: Nurse Practitioner

## 2020-12-20 ENCOUNTER — Ambulatory Visit: Payer: BLUE CROSS/BLUE SHIELD | Admitting: Nurse Practitioner

## 2020-12-20 VITALS — BP 137/84 | HR 79 | Temp 97.1°F | Ht 64.0 in | Wt 250.0 lb

## 2020-12-20 DIAGNOSIS — I10 Essential (primary) hypertension: Secondary | ICD-10-CM

## 2020-12-20 DIAGNOSIS — F5101 Primary insomnia: Secondary | ICD-10-CM

## 2020-12-20 MED ORDER — ZOLPIDEM TARTRATE 10 MG PO TABS: 10.0000 mg | ORAL_TABLET | Freq: Every evening | ORAL | 0 refills | Status: DC | PRN

## 2020-12-20 MED ORDER — NEBIVOLOL HCL 10 MG PO TABS: 10.0000 mg | ORAL_TABLET | Freq: Every day | ORAL | 1 refills | Status: DC

## 2020-12-20 NOTE — Assessment & Plan Note (Signed)
Hypertension not well controlled.  Patient reports running out of Bystolic 10 mg tablet by mouth daily for the past 3 months.  Reordered Bystolic Rx sent to pharmacy.  Provided education to patient with printed handouts given. Follow-up with worsening unresolved symptoms.

## 2020-12-20 NOTE — Assessment & Plan Note (Signed)
Ambien 10 mg tablet by mouth at bedtime needed for sleep.

## 2020-12-20 NOTE — Progress Notes (Addendum)
Established Patient Office Visit  Subjective:  Patient ID: Ashley Bradford, female    DOB: 1970/12/29  Age: 50 y.o. MRN: [id number]  CC:  Chief Complaint  Patient presents with  . Hypertension  . Insomnia    HPI Ashley Bradford presents for follow up of hypertension. Patient was diagnosed in 02/26/2013 the patient is tolerating the medication well without side effects. Compliance with treatment has been good; including taking medication as directed , maintains a healthy diet and regular exercise regimen , and following up as directed.  Current medication Bystolic 10 mg tablet by mouth daily.    Patient is reporting worsening insomnia the last few weeks due to change in time change.  This is not new for patient as insomnia worsens with time change.  Patient is currently on Ambien 10 mg tablet at bedtime as needed for sleep.  Past Medical History:  Diagnosis Date  . Anemia   . Asthma    EXERCISE INDUCED  . Family history of adverse reaction to anesthesia    HARD TO WAKE UP  . GERD (gastroesophageal reflux disease)   . Headache    MIGRAINES  . History of kidney stones   . Hypertension   . Left ureteral calculus   . Urgency of urination     Past Surgical History:  Procedure Laterality Date  . ABDOMINOPLASTY/PANNICULECTOMY  2004  . BREAST REDUCTION SURGERY  2005  . CESAREAN SECTION  07-02-2001 &   1997   W/  BILATERAL TUBAL LIGATION  . NEPHROLITHOTOMY Right 1996  . TOTAL LAPAROSCOPIC HYSTERECTOMY WITH SALPINGECTOMY Bilateral 12/06/2017   Procedure: TOTAL LAPAROSCOPIC HYSTERECTOMY WITH SALPINGECTOMY;  Surgeon: Huel Cote, MD;  Location: [facility];  Service: Gynecology;  Laterality: Bilateral;  MD request extra OR time. 2.5hrs total OR time    Family History  Problem Relation Age of Onset  . Hypertension Mother   . Hypertension Father     Social History   Socioeconomic History  . Marital status: Single    Spouse name: Not on file  . Number of children: Not on file  .  Years of education: Not on file  . Highest education level: Not on file  Occupational History  . Not on file  Tobacco Use  . Smoking status: Never Smoker  . Smokeless tobacco: Never Used  Vaping Use  . Vaping Use: Never used  Substance and Sexual Activity  . Alcohol use: Yes    Comment: occasionally  . Drug use: No  . Sexual activity: Not Currently    Birth control/protection: Surgical  Other Topics Concern  . Not on file  Social History Narrative  . Not on file   Social Determinants of Health   Financial Resource Strain: Not on file  Food Insecurity: Not on file  Transportation Needs: Not on file  Physical Activity: Not on file  Stress: Not on file  Social Connections: Not on file  Intimate Partner Violence: Not on file    Outpatient Medications Prior to Visit  Medication Sig Dispense Refill  . EPINEPHrine 0.3 mg/0.3 mL IJ SOAJ injection Inject 0.3 mLs (0.3 mg total) into the muscle once as needed for up to 1 dose for anaphylaxis. 1 Device 0  . escitalopram (LEXAPRO) 10 MG tablet TAKE 1 TABLET BY MOUTH ONCE DAILY 90 tablet 0  . ibuprofen (ADVIL,MOTRIN) 800 MG tablet Take 1 tablet (800 mg total) by mouth 2 (two) times daily as needed. 60 tablet 3  . methylphenidate (RITALIN) 20 MG tablet  Take 1 tablet (20 mg total) by mouth 2 (two) times daily. 30 tablet 0  . methylphenidate 36 MG PO CR tablet Take 1 tablet (36 mg total) by mouth daily. 30 tablet 0  . methylphenidate 36 MG PO CR tablet Take 1 tablet (36 mg total) by mouth daily. 30 tablet 0  . methylphenidate 36 MG PO CR tablet Take 1 tablet (36 mg total) by mouth daily. 30 tablet 0  . pantoprazole (PROTONIX) 40 MG tablet Take 1 tablet (40 mg total) by mouth 2 (two) times daily. 90 tablet 1  . PROAIR HFA 108 (90 Base) MCG/ACT inhaler INHALE 2 PUFFS BY MOUTH EVERY 6 HOURS AS NEEDED 8.5 g 5  . triamcinolone ointment (KENALOG) 0.5 % Apply 1 application topically 2 (two) times daily. 30 g 0  . Vitamin D, Ergocalciferol,  (DRISDOL) 1.25 MG (50000 UT) CAPS capsule Take 1 capsule (50,000 Units total) by mouth every 7 (seven) days. 12 capsule 3  . nebivolol (BYSTOLIC) 10 MG tablet Take 1 tablet (10 mg total) by mouth daily. 90 tablet 1  . zolpidem (AMBIEN) 10 MG tablet Take 1 tablet (10 mg total) by mouth at bedtime as needed for sleep. 30 tablet 3   No facility-administered medications prior to visit.    Allergies  Allergen Reactions  . Other Anaphylaxis    Honey  . Peanut-Containing Drug Products Anaphylaxis  . Hydromorphone Itching  . Morphine And Related Itching  . Sulfa Antibiotics Rash    ROS Review of Systems  HENT: Negative.   Respiratory: Negative.   Cardiovascular: Negative.   Gastrointestinal: Negative.   Genitourinary: Negative.   Musculoskeletal: Negative.   Skin: Negative.   Neurological: Negative for light-headedness, numbness and headaches.  Psychiatric/Behavioral: Negative.   All other systems reviewed and are negative.     Objective:    Physical Exam Vitals reviewed.  Constitutional:      Appearance: Normal appearance.  HENT:     Head: Normocephalic.     Nose: Nose normal.  Eyes:     Conjunctiva/sclera: Conjunctivae normal.  Cardiovascular:     Rate and Rhythm: Normal rate.     Pulses: Normal pulses.     Heart sounds: Normal heart sounds.  Pulmonary:     Effort: Pulmonary effort is normal.     Breath sounds: Normal breath sounds.  Abdominal:     General: Bowel sounds are normal.  Musculoskeletal:        General: Normal range of motion.  Skin:    General: Skin is warm.  Neurological:     Mental Status: She is alert and oriented to person, place, and time.     BP 137/84   Pulse 79   Temp (!) 97.1 F (36.2 C) (Temporal)   Ht 5\' 4"  (1.626 m)   Wt 250 lb (113.4 kg)   LMP 11/23/2017 (Approximate)   SpO2 96%   BMI 42.91 kg/m  Wt Readings from Last 3 Encounters:  12/20/20 250 lb (113.4 kg)  10/09/18 233 lb 12.8 oz (106.1 kg)  07/04/18 224 lb (101.6 kg)      Health Maintenance Due  Topic Date Due  . Hepatitis C Screening  Never done  . COVID-19 Vaccine (1) Never done  . COLONOSCOPY (Pts 45-20yrs Insurance coverage will need to be confirmed)  Never done  . MAMMOGRAM  10/05/2018  . TETANUS/TDAP  01/19/2020    There are no preventive care reminders to display for this patient.  Lab Results  Component Value Date  TSH 1.100 07/20/2017   Lab Results  Component Value Date   WBC 10.7 10/09/2018   HGB 13.3 10/09/2018   HCT 39.1 10/09/2018   MCV 91 10/09/2018   PLT 281 10/09/2018   Lab Results  Component Value Date   NA 140 10/09/2018   K 4.2 10/09/2018   CO2 22 10/09/2018   GLUCOSE 86 10/09/2018   BUN 13 10/09/2018   CREATININE 0.83 10/09/2018   BILITOT 0.3 10/09/2018   ALKPHOS 87 10/09/2018   AST 14 10/09/2018   ALT 19 10/09/2018   PROT 6.7 10/09/2018   ALBUMIN 4.5 10/09/2018   CALCIUM 9.8 10/09/2018   ANIONGAP 8 12/14/2017   Lab Results  Component Value Date   CHOL 161 07/20/2017   Lab Results  Component Value Date   HDL 61 07/20/2017   Lab Results  Component Value Date   LDLCALC 83 07/20/2017   Lab Results  Component Value Date   TRIG 85 07/20/2017   Lab Results  Component Value Date   CHOLHDL 2.6 07/20/2017   No results found for: HGBA1C    Assessment & Plan:   Problem List Items Addressed This Visit      Cardiovascular and Mediastinum   Hypertension    Hypertension not well controlled.  Patient reports running out of Bystolic 10 mg tablet by mouth daily for the past 3 months.  Reordered Bystolic Rx sent to pharmacy.  Provided education to patient with printed handouts given. Follow-up with worsening unresolved symptoms.      Relevant Medications   nebivolol (BYSTOLIC) 10 MG tablet     Other   Insomnia    Ambien 10 mg tablet by mouth at bedtime needed for sleep.      Relevant Medications   zolpidem (AMBIEN) 10 MG tablet    Other Visit Diagnoses    Essential hypertension    -   Primary   Relevant Medications   nebivolol (BYSTOLIC) 10 MG tablet      Meds ordered this encounter  Medications  . zolpidem (AMBIEN) 10 MG tablet    Sig: Take 1 tablet (10 mg total) by mouth at bedtime as needed for sleep.    Dispense:  14 tablet    Refill:  0    Order Specific Question:   Supervising Provider    Answer:   Raliegh Ip [[id number]]  . nebivolol (BYSTOLIC) 10 MG tablet    Sig: Take 1 tablet (10 mg total) by mouth daily.    Dispense:  90 tablet    Refill:  1    Pt has tried coreg and metoprolol with no success    Order Specific Question:   Supervising Provider    Answer:   Raliegh Ip [[id number]]    Follow-up: Return if symptoms worsen or fail to improve.    Daryll Drown, NP

## 2020-12-20 NOTE — Patient Instructions (Signed)
Insomnia Insomnia is a sleep disorder that makes it difficult to fall asleep or stay asleep. Insomnia can cause fatigue, low energy, difficulty concentrating, mood swings, and poor performance at work or school. There are three different ways to classify insomnia:  Difficulty falling asleep.  Difficulty staying asleep.  Waking up too early in the morning. Any type of insomnia can be long-term (chronic) or short-term (acute). Both are common. Short-term insomnia usually lasts for three months or less. Chronic insomnia occurs at least three times a week for longer than three months. What are the causes? Insomnia may be caused by another condition, situation, or substance, such as:  Anxiety.  Certain medicines.  Gastroesophageal reflux disease (GERD) or other gastrointestinal conditions.  Asthma or other breathing conditions.  Restless legs syndrome, sleep apnea, or other sleep disorders.  Chronic pain.  Menopause.  Stroke.  Abuse of alcohol, tobacco, or illegal drugs.  Mental health conditions, such as depression.  Caffeine.  Neurological disorders, such as Alzheimer's disease.  An overactive thyroid (hyperthyroidism). Sometimes, the cause of insomnia may not be known. What increases the risk? Risk factors for insomnia include:  Gender. Women are affected more often than men.  Age. Insomnia is more common as you get older.  Stress.  Lack of exercise.  Irregular work schedule or working night shifts.  Traveling between different time zones.  Certain medical and mental health conditions. What are the signs or symptoms? If you have insomnia, the main symptom is having trouble falling asleep or having trouble staying asleep. This may lead to other symptoms, such as:  Feeling fatigued or having low energy.  Feeling nervous about going to sleep.  Not feeling rested in the morning.  Having trouble concentrating.  Feeling irritable, anxious, or depressed. How  is this diagnosed? This condition may be diagnosed based on:  Your symptoms and medical history. Your health care provider may ask about: ? Your sleep habits. ? Any medical conditions you have. ? Your mental health.  A physical exam. How is this treated? Treatment for insomnia depends on the cause. Treatment may focus on treating an underlying condition that is causing insomnia. Treatment may also include:  Medicines to help you sleep.  Counseling or therapy.  Lifestyle adjustments to help you sleep better. Follow these instructions at home: Eating and drinking  Limit or avoid alcohol, caffeinated beverages, and cigarettes, especially close to bedtime. These can disrupt your sleep.  Do not eat a large meal or eat spicy foods right before bedtime. This can lead to digestive discomfort that can make it hard for you to sleep.   Sleep habits  Keep a sleep diary to help you and your health care provider figure out what could be causing your insomnia. Write down: ? When you sleep. ? When you wake up during the night. ? How well you sleep. ? How rested you feel the next day. ? Any side effects of medicines you are taking. ? What you eat and drink.  Make your bedroom a dark, comfortable place where it is easy to fall asleep. ? Put up shades or blackout curtains to block light from outside. ? Use a white noise machine to block noise. ? Keep the temperature cool.  Limit screen use before bedtime. This includes: ? Watching TV. ? Using your smartphone, tablet, or computer.  Stick to a routine that includes going to bed and waking up at the same times every day and night. This can help you fall asleep faster. Consider   making a quiet activity, such as reading, part of your nighttime routine.  Try to avoid taking naps during the day so that you sleep better at night.  Get out of bed if you are still awake after 15 minutes of trying to sleep. Keep the lights down, but try reading or  doing a quiet activity. When you feel sleepy, go back to bed.   General instructions  Take over-the-counter and prescription medicines only as told by your health care provider.  Exercise regularly, as told by your health care provider. Avoid exercise starting several hours before bedtime.  Use relaxation techniques to manage stress. Ask your health care provider to suggest some techniques that may work well for you. These may include: ? Breathing exercises. ? Routines to release muscle tension. ? Visualizing peaceful scenes.  Make sure that you drive carefully. Avoid driving if you feel very sleepy.  Keep all follow-up visits as told by your health care provider. This is important. Contact a health care provider if:  You are tired throughout the day.  You have trouble in your daily routine due to sleepiness.  You continue to have sleep problems, or your sleep problems get worse. Get help right away if:  You have serious thoughts about hurting yourself or someone else. If you ever feel like you may hurt yourself or others, or have thoughts about taking your own life, get help right away. You can go to your nearest emergency department or call:  Your local emergency services (911 in the [place]  A suicide crisis helpline, such as the National Suicide Prevention Lifeline at 1-800-273-8255. This is open 24 hours a day. Summary  Insomnia is a sleep disorder that makes it difficult to fall asleep or stay asleep.  Insomnia can be long-term (chronic) or short-term (acute).  Treatment for insomnia depends on the cause. Treatment may focus on treating an underlying condition that is causing insomnia.  Keep a sleep diary to help you and your health care provider figure out what could be causing your insomnia. This information is not intended to replace advice given to you by your health care provider. Make sure you discuss any questions you have with your health care provider. Document  Revised: 07/22/2020 Document Reviewed: 07/22/2020 Elsevier Patient Education  2021 [employer] Hypertension, Adult Hypertension is another name for high blood pressure. High blood pressure forces your heart to work harder to pump blood. This can cause problems over time. There are two numbers in a blood pressure reading. There is a top number (systolic) over a bottom number (diastolic). It is best to have a blood pressure that is below 120/80. Healthy choices can help lower your blood pressure, or you may need medicine to help lower it. What are the causes? The cause of this condition is not known. Some conditions may be related to high blood pressure. What increases the risk?  Smoking.  Having type 2 diabetes mellitus, high cholesterol, or both.  Not getting enough exercise or physical activity.  Being overweight.  Having too much fat, sugar, calories, or salt (sodium) in your diet.  Drinking too much alcohol.  Having long-term (chronic) kidney disease.  Having a family history of high blood pressure.  Age. Risk increases with age.  Race. You may be at higher risk if you are African American.  Gender. Men are at higher risk than women before age 45. After age 65, women are at higher risk than men.  Having obstructive sleep apnea.  Stress.   What are the signs or symptoms?  High blood pressure may not cause symptoms. Very high blood pressure (hypertensive crisis) may cause: ? Headache. ? Feelings of worry or nervousness (anxiety). ? Shortness of breath. ? Nosebleed. ? A feeling of being sick to your stomach (nausea). ? Throwing up (vomiting). ? Changes in how you see. ? Very bad chest pain. ? Seizures. How is this treated?  This condition is treated by making healthy lifestyle changes, such as: ? Eating healthy foods. ? Exercising more. ? Drinking less alcohol.  Your health care provider may prescribe medicine if lifestyle changes are not enough to get your blood  pressure under control, and if: ? Your top number is above 130. ? Your bottom number is above 80.  Your personal target blood pressure may vary. Follow these instructions at home: Eating and drinking  If told, follow the DASH eating plan. To follow this plan: ? Fill one half of your plate at each meal with fruits and vegetables. ? Fill one fourth of your plate at each meal with whole grains. Whole grains include whole-wheat pasta, brown rice, and whole-grain bread. ? Eat or drink low-fat dairy products, such as skim milk or low-fat yogurt. ? Fill one fourth of your plate at each meal with low-fat (lean) proteins. Low-fat proteins include fish, chicken without skin, eggs, beans, and tofu. ? Avoid fatty meat, cured and processed meat, or chicken with skin. ? Avoid pre-made or processed food.  Eat less than 1,500 mg of salt each day.  Do not drink alcohol if: ? Your doctor tells you not to drink. ? You are pregnant, may be pregnant, or are planning to become pregnant.  If you drink alcohol: ? Limit how much you use to:  0-1 drink a day for women.  0-2 drinks a day for men. ? Be aware of how much alcohol is in your drink. In the [place] one drink equals one 12 oz bottle of beer (355 mL), one 5 oz glass of wine (148 mL), or one 1 oz glass of hard liquor (44 mL).   Lifestyle  Work with your doctor to stay at a healthy weight or to lose weight. Ask your doctor what the best weight is for you.  Get at least 30 minutes of exercise most days of the week. This may include walking, swimming, or biking.  Get at least 30 minutes of exercise that strengthens your muscles (resistance exercise) at least 3 days a week. This may include lifting weights or doing Pilates.  Do not use any products that contain nicotine or tobacco, such as cigarettes, e-cigarettes, and chewing tobacco. If you need help quitting, ask your doctor.  Check your blood pressure at home as told by your doctor.  Keep all  follow-up visits as told by your doctor. This is important.   Medicines  Take over-the-counter and prescription medicines only as told by your doctor. Follow directions carefully.  Do not skip doses of blood pressure medicine. The medicine does not work as well if you skip doses. Skipping doses also puts you at risk for problems.  Ask your doctor about side effects or reactions to medicines that you should watch for. Contact a doctor if you:  Think you are having a reaction to the medicine you are taking.  Have headaches that keep coming back (recurring).  Feel dizzy.  Have swelling in your ankles.  Have trouble with your vision. Get help right away if you:  Get a very bad  headache.  Start to feel mixed up (confused).  Feel weak or numb.  Feel faint.  Have very bad pain in your: ? Chest. ? Belly (abdomen).  Throw up more than once.  Have trouble breathing. Summary  Hypertension is another name for high blood pressure.  High blood pressure forces your heart to work harder to pump blood.  For most people, a normal blood pressure is less than 120/80.  Making healthy choices can help lower blood pressure. If your blood pressure does not get lower with healthy choices, you may need to take medicine. This information is not intended to replace advice given to you by your health care provider. Make sure you discuss any questions you have with your health care provider. Document Revised: 05/22/2018 Document Reviewed: 05/22/2018 Elsevier Patient Education  2021 [employer].

## 2020-12-28 ENCOUNTER — Telehealth: Payer: Self-pay | Admitting: *Deleted

## 2020-12-28 NOTE — Telephone Encounter (Signed)
Patient assistance application filled out and faxed to [facility] for [facility]

## 2021-01-25 ENCOUNTER — Other Ambulatory Visit: Payer: Self-pay | Admitting: Family Medicine

## 2021-01-25 ENCOUNTER — Encounter: Payer: Self-pay | Admitting: Family Medicine

## 2021-03-01 ENCOUNTER — Ambulatory Visit (INDEPENDENT_AMBULATORY_CARE_PROVIDER_SITE_OTHER): Payer: Self-pay | Admitting: Family

## 2021-03-01 ENCOUNTER — Other Ambulatory Visit: Payer: Self-pay

## 2021-03-01 ENCOUNTER — Encounter: Payer: Self-pay | Admitting: Family

## 2021-03-01 VITALS — BP 118/72 | HR 66 | Temp 97.6°F | Ht 64.0 in | Wt 256.4 lb

## 2021-03-01 DIAGNOSIS — R109 Unspecified abdominal pain: Secondary | ICD-10-CM

## 2021-03-01 DIAGNOSIS — L259 Unspecified contact dermatitis, unspecified cause: Secondary | ICD-10-CM

## 2021-03-01 DIAGNOSIS — F5101 Primary insomnia: Secondary | ICD-10-CM

## 2021-03-01 DIAGNOSIS — Z79899 Other long term (current) drug therapy: Secondary | ICD-10-CM

## 2021-03-01 DIAGNOSIS — N3001 Acute cystitis with hematuria: Secondary | ICD-10-CM

## 2021-03-01 DIAGNOSIS — R10A0 Flank pain, unspecified side: Secondary | ICD-10-CM

## 2021-03-01 LAB — URINALYSIS, COMPLETE
Bilirubin, UA: NEGATIVE
Glucose, UA: NEGATIVE
Ketones, UA: NEGATIVE
Nitrite, UA: POSITIVE — AB
Specific Gravity, UA: 1.025 (ref 1.005–1.030)
Urobilinogen, Ur: 4 mg/dL — ABNORMAL HIGH (ref 0.2–1.0)
pH, UA: 6 (ref 5.0–7.5)

## 2021-03-01 LAB — MICROSCOPIC EXAMINATION

## 2021-03-01 MED ORDER — CEPHALEXIN 500 MG PO CAPS: 500.0000 mg | ORAL_CAPSULE | Freq: Two times a day (BID) | ORAL | 0 refills | Status: DC

## 2021-03-01 MED ORDER — ZOLPIDEM TARTRATE 10 MG PO TABS: 10.0000 mg | ORAL_TABLET | Freq: Every evening | ORAL | 1 refills | Status: DC | PRN

## 2021-03-01 MED ORDER — TRIAMCINOLONE ACETONIDE 0.5 % EX OINT
1.0000 | TOPICAL_OINTMENT | Freq: Two times a day (BID) | CUTANEOUS | 1 refills | Status: DC
Start: 2021-03-01 — End: 2022-06-19

## 2021-03-01 NOTE — Patient Instructions (Signed)
Urinary Tract Infection, Adult  A urinary tract infection (UTI) is an infection of any part of the urinary tract. The urinary tract includes the kidneys, ureters, bladder, and urethra. These organs make, store, and get rid of urine in the body. An upper UTI affects the ureters and kidneys. A lower UTI affects the bladder and urethra. What are the causes? Most urinary tract infections are caused by bacteria in your genital area around your urethra, where urine leaves your body. These bacteria grow and cause inflammation of your urinary tract. What increases the risk? You are more likely to develop this condition if:  You have a urinary catheter that stays in place.  You are not able to control when you urinate or have a bowel movement (incontinence).  You are female and you: ? Use a spermicide or diaphragm for birth control. ? Have low estrogen levels. ? Are pregnant.  You have certain genes that increase your risk.  You are sexually active.  You take antibiotic medicines.  You have a condition that causes your flow of urine to slow down, such as: ? An enlarged prostate, if you are female. ? Blockage in your urethra. ? A kidney stone. ? A nerve condition that affects your bladder control (neurogenic bladder). ? Not getting enough to drink, or not urinating often.  You have certain medical conditions, such as: ? Diabetes. ? A weak disease-fighting system (immunesystem). ? Sickle cell disease. ? Gout. ? Spinal cord injury. What are the signs or symptoms? Symptoms of this condition include:  Needing to urinate right away (urgency).  Frequent urination. This may include small amounts of urine each time you urinate.  Pain or burning with urination.  Blood in the urine.  Urine that smells bad or unusual.  Trouble urinating.  Cloudy urine.  Vaginal discharge, if you are female.  Pain in the abdomen or the lower back. You may also have:  Vomiting or a decreased  appetite.  Confusion.  Irritability or tiredness.  A fever or chills.  Diarrhea. The first symptom in older adults may be confusion. In some cases, they may not have any symptoms until the infection has worsened. How is this diagnosed? This condition is diagnosed based on your medical history and a physical exam. You may also have other tests, including:  Urine tests.  Blood tests.  Tests for STIs (sexually transmitted infections). If you have had more than one UTI, a cystoscopy or imaging studies may be done to determine the cause of the infections. How is this treated? Treatment for this condition includes:  Antibiotic medicine.  Over-the-counter medicines to treat discomfort.  Drinking enough water to stay hydrated. If you have frequent infections or have other conditions such as a kidney stone, you may need to see a health care provider who specializes in the urinary tract (urologist). In rare cases, urinary tract infections can cause sepsis. Sepsis is a life-threatening condition that occurs when the body responds to an infection. Sepsis is treated in the hospital with IV antibiotics, fluids, and other medicines. Follow these instructions at home: Medicines  Take over-the-counter and prescription medicines only as told by your health care provider.  If you were prescribed an antibiotic medicine, take it as told by your health care provider. Do not stop using the antibiotic even if you start to feel better. General instructions  Make sure you: ? Empty your bladder often and completely. Do not hold urine for long periods of time. ? Empty your bladder after   sex. ? Wipe from front to back after urinating or having a bowel movement if you are female. Use each tissue only one time when you wipe.  Drink enough fluid to keep your urine pale yellow.  Keep all follow-up visits. This is important.   Contact a health care provider if:  Your symptoms do not get better after 1-2  days.  Your symptoms go away and then return. Get help right away if:  You have severe pain in your back or your lower abdomen.  You have a fever or chills.  You have nausea or vomiting. Summary  A urinary tract infection (UTI) is an infection of any part of the urinary tract, which includes the kidneys, ureters, bladder, and urethra.  Most urinary tract infections are caused by bacteria in your genital area.  Treatment for this condition often includes antibiotic medicines.  If you were prescribed an antibiotic medicine, take it as told by your health care provider. Do not stop using the antibiotic even if you start to feel better.  Keep all follow-up visits. This is important. This information is not intended to replace advice given to you by your health care provider. Make sure you discuss any questions you have with your health care provider. Document Revised: 04/23/2020 Document Reviewed: 04/23/2020 Elsevier Patient Education  2021 Elsevier Inc.  

## 2021-03-01 NOTE — Progress Notes (Signed)
Subjective:    Patient ID: Ashley Bradford, female    DOB: [birth year], 50 y.o.   MRN: [id number]  Chief Complaint  Patient presents with  . Flank Pain    RIGHT SIDE NAUSEA SWEATING     Flank Pain This is a new problem. The current episode started in the past 7 days. The problem occurs constantly. The problem has been waxing and waning since onset. Pain location: right flank. The quality of the pain is described as aching. The pain is at a severity of 9/10. The pain is moderate. The symptoms are aggravated by lying down. (Nausea and sweating ) Risk factors include obesity. Treatments tried: force fluids. The treatment provided mild relief.  Insomnia Primary symptoms: difficulty falling asleep, frequent awakening.  The current episode started more than one year. The onset quality is gradual. The problem occurs intermittently.      Review of Systems  Genitourinary: Positive for flank pain.  Psychiatric/Behavioral: The patient has insomnia.   All other systems reviewed and are negative.      Objective:   Physical Exam Vitals reviewed.  Constitutional:      General: She is not in acute distress.    Appearance: She is well-developed.  HENT:     Head: Normocephalic and atraumatic.     Right Ear: Tympanic membrane normal.     Left Ear: Tympanic membrane normal.  Eyes:     Pupils: Pupils are equal, round, and reactive to light.  Neck:     Thyroid: No thyromegaly.  Cardiovascular:     Rate and Rhythm: Normal rate and regular rhythm.     Heart sounds: Normal heart sounds. No murmur heard.   Pulmonary:     Effort: Pulmonary effort is normal. No respiratory distress.     Breath sounds: Normal breath sounds. No wheezing.  Abdominal:     General: Bowel sounds are normal. There is no distension.     Palpations: Abdomen is soft.     Tenderness: There is no abdominal tenderness. There is right CVA tenderness (mild).  Musculoskeletal:        General: No tenderness. Normal range of  motion.     Cervical back: Normal range of motion and neck supple.  Skin:    General: Skin is warm and dry.  Neurological:     Mental Status: She is alert and oriented to person, place, and time.     Cranial Nerves: No cranial nerve deficit.     Deep Tendon Reflexes: Reflexes are normal and symmetric.  Psychiatric:        Behavior: Behavior normal.        Thought Content: Thought content normal.        Judgment: Judgment normal.          BP 118/72   Pulse 66   Temp 97.6 F (36.4 C) (Temporal)   Ht 5\' 4"  (1.626 m)   Wt 256 lb 6.4 oz (116.3 kg)   LMP 11/23/2017 (Approximate)   BMI 44.01 kg/m   Assessment & Plan:  CAREY LAFON comes in today with chief complaint of Flank Pain (RIGHT SIDE NAUSEA SWEATING )   Diagnosis and orders addressed:  1. Flank pain - Urinalysis, Complete  2. Primary insomnia Sleep ritual  - zolpidem (AMBIEN) 10 MG tablet; Take 1 tablet (10 mg total) by mouth at bedtime as needed for sleep.  Dispense: 90 tablet; Refill: 1  3. Morbid obesity (HCC)  4. Acute cystitis with hematuria Force fluids  AZO over the counter X2 days RTO if symptoms worsen or do not improve, she is self pay so we will hold off on Rocephin. If does not improve in next 1-2 days will need rocephin.  - cephALEXin (KEFLEX) 500 MG capsule; Take 1 capsule (500 mg total) by mouth 2 (two) times daily.  Dispense: 14 capsule; Refill: 0  5. Controlled substance agreement signed  6. Contact dermatitis, unspecified contact dermatitis type, unspecified trigger - triamcinolone ointment (KENALOG) 0.5 %; Apply 1 application topically 2 (two) times daily.  Dispense: 60 g; Refill: 1    Health Maintenance reviewed Diet and exercise encouraged  Follow up plan: 6 months    Jannifer Rodney, FNP

## 2021-03-01 NOTE — Addendum Note (Signed)
Addended by: Austin Miles F on: 03/01/2021 04:17 PM   Modules accepted: Orders

## 2021-03-04 ENCOUNTER — Telehealth: Payer: Self-pay | Admitting: Family

## 2021-03-04 LAB — URINE CULTURE

## 2021-03-04 NOTE — Telephone Encounter (Signed)
Patient aware and verbalized understanding. °

## 2021-07-20 ENCOUNTER — Encounter: Payer: Self-pay | Admitting: Family Medicine

## 2021-07-20 ENCOUNTER — Other Ambulatory Visit: Payer: Self-pay

## 2021-07-20 ENCOUNTER — Ambulatory Visit (INDEPENDENT_AMBULATORY_CARE_PROVIDER_SITE_OTHER): Payer: Self-pay | Admitting: Family Medicine

## 2021-07-20 VITALS — BP 109/70 | HR 59 | Temp 98.2°F | Ht 64.0 in | Wt 242.5 lb

## 2021-07-20 DIAGNOSIS — I10 Essential (primary) hypertension: Secondary | ICD-10-CM

## 2021-07-20 DIAGNOSIS — R399 Unspecified symptoms and signs involving the genitourinary system: Secondary | ICD-10-CM

## 2021-07-20 LAB — URINALYSIS, ROUTINE W REFLEX MICROSCOPIC
Bilirubin, UA: NEGATIVE
Glucose, UA: NEGATIVE
Nitrite, UA: NEGATIVE
Protein,UA: NEGATIVE
Specific Gravity, UA: 1.02 (ref 1.005–1.030)
Urobilinogen, Ur: 0.2 mg/dL (ref 0.2–1.0)
pH, UA: 5.5 (ref 5.0–7.5)

## 2021-07-20 LAB — MICROSCOPIC EXAMINATION: Renal Epithel, UA: NONE SEEN /hpf

## 2021-07-20 MED ORDER — CEPHALEXIN 500 MG PO CAPS: 500.0000 mg | ORAL_CAPSULE | Freq: Two times a day (BID) | ORAL | 0 refills | Status: DC

## 2021-07-20 MED ORDER — NEBIVOLOL HCL 10 MG PO TABS: 10.0000 mg | ORAL_TABLET | Freq: Every day | ORAL | 0 refills | Status: DC

## 2021-07-20 NOTE — Patient Instructions (Signed)
Flank Pain, Adult ?Flank pain is pain that is located on the side of the body between the upper abdomen and the spine. This area is called the flank. The pain may occur over a short period of time (acute), or it may be long-term or recurring (chronic). It may be mild or severe. Flank pain can be caused by many things, including: ?Muscle soreness or injury. ?Kidney infection, kidney stones, or kidney disease. ?Stress. ?A disease of the spine (vertebral disk disease). ?A lung infection (pneumonia). ?Fluid around the lungs (pulmonary edema). ?A skin rash caused by the chickenpox virus (shingles). ?Tumors that affect the back of the abdomen. ?Gallbladder disease. ?Follow these instructions at home: ? ?Drink enough fluid to keep your urine pale yellow. ?Rest as told by your health care provider. ?Take over-the-counter and prescription medicines only as told by your health care provider. ?Keep a journal to track what has caused your flank pain and what has made it feel better. ?Keep all follow-up visits. This is important. ?Contact a health care provider if: ?Your pain is not controlled with medicine. ?You have new symptoms. ?Your pain gets worse. ?Your symptoms last longer than 2-3 days. ?You have trouble urinating or you are urinating very frequently. ?Get help right away if: ?You have trouble breathing or you are short of breath. ?Your abdomen hurts or it is swollen or red. ?You have nausea or vomiting. ?You feel faint, or you faint. ?You have blood in your urine. ?You have flank pain and a fever. ?These symptoms may represent a serious problem that is an emergency. Do not wait to see if the symptoms will go away. Get medical help right away. Call your local emergency services (911 in the [place] Do not drive yourself to the hospital. ?Summary ?Flank pain is pain that is located on the side of the body between the upper abdomen and the spine. ?The pain may occur over a short period of time (acute), or it may be  long-term or recurring (chronic). It may be mild or severe. ?Flank pain can be caused by many things. ?Contact your health care provider if your symptoms get worse or last longer than 2-3 days. ?This information is not intended to replace advice given to you by your health care provider. Make sure you discuss any questions you have with your health care provider. ?Document Revised: 11/22/2020 Document Reviewed: 11/22/2020 ?Elsevier Patient Education ? 2022 Elsevier Inc. ? ?

## 2021-07-20 NOTE — Progress Notes (Signed)
Acute Office Visit  Subjective:    Patient ID: Ashley Bradford, female    DOB: [birth year], 50 y.o.   MRN: [id number]  Chief Complaint  Patient presents with   Flank Pain    HPI Patient is in today for flank pain x 2 days. It is bilateral. She also reports subjective fever, urgency, frequency, and dysuria. She has taken 3 doses of keflex that she has taken that was left over from a previous infection. She reports improvement in symptoms with keflex.   She also needs a refill on her bystolic. She has not had a chronic follow up with her PCP in some time.   Past Medical History:  Diagnosis Date   Anemia    Asthma    EXERCISE INDUCED   Family history of adverse reaction to anesthesia    HARD TO WAKE UP   GERD (gastroesophageal reflux disease)    Headache    MIGRAINES   History of kidney stones    Hypertension    Left ureteral calculus    Urgency of urination     Past Surgical History:  Procedure Laterality Date   ABDOMINOPLASTY/PANNICULECTOMY  2004   BREAST REDUCTION SURGERY  2005   CESAREAN SECTION  07-02-2001 &   1997   W/  BILATERAL TUBAL LIGATION   NEPHROLITHOTOMY Right 1996   TOTAL LAPAROSCOPIC HYSTERECTOMY WITH SALPINGECTOMY Bilateral 12/06/2017   Procedure: TOTAL LAPAROSCOPIC HYSTERECTOMY WITH SALPINGECTOMY;  Surgeon: Huel Cote, MD;  Location: [facility];  Service: Gynecology;  Laterality: Bilateral;  MD request extra OR time. 2.5hrs total OR time    Family History  Problem Relation Age of Onset   Hypertension Mother    Hypertension Father     Social History   Socioeconomic History   Marital status: Single    Spouse name: Not on file   Number of children: Not on file   Years of education: Not on file   Highest education level: Not on file  Occupational History   Not on file  Tobacco Use   Smoking status: Never   Smokeless tobacco: Never  Vaping Use   Vaping Use: Never used  Substance and Sexual Activity   Alcohol use: Yes    Comment:  occasionally   Drug use: No   Sexual activity: Not Currently    Birth control/protection: Surgical  Other Topics Concern   Not on file  Social History Narrative   Not on file   Social Determinants of Health   Financial Resource Strain: Not on file  Food Insecurity: Not on file  Transportation Needs: Not on file  Physical Activity: Not on file  Stress: Not on file  Social Connections: Not on file  Intimate Partner Violence: Not on file    Outpatient Medications Prior to Visit  Medication Sig Dispense Refill   ibuprofen (ADVIL,MOTRIN) 800 MG tablet Take 1 tablet (800 mg total) by mouth 2 (two) times daily as needed. 60 tablet 3   nebivolol (BYSTOLIC) 10 MG tablet Take 1 tablet (10 mg total) by mouth daily. 90 tablet 1   triamcinolone ointment (KENALOG) 0.5 % Apply 1 application topically 2 (two) times daily. 60 g 1   zolpidem (AMBIEN) 10 MG tablet Take 1 tablet (10 mg total) by mouth at bedtime as needed for sleep. 90 tablet 1   EPINEPHrine 0.3 mg/0.3 mL IJ SOAJ injection Inject 0.3 mLs (0.3 mg total) into the muscle once as needed for up to 1 dose for anaphylaxis. (Patient not taking: Reported  on 07/20/2021) 1 Device 0   PROAIR HFA 108 (90 Base) MCG/ACT inhaler INHALE 2 PUFFS BY MOUTH EVERY 6 HOURS AS NEEDED (Patient not taking: Reported on 07/20/2021) 8.5 g 5   cephALEXin (KEFLEX) 500 MG capsule Take 1 capsule (500 mg total) by mouth 2 (two) times daily. 14 capsule 0   escitalopram (LEXAPRO) 10 MG tablet TAKE 1 TABLET BY MOUTH ONCE DAILY 90 tablet 0   No facility-administered medications prior to visit.    Allergies  Allergen Reactions   Other Anaphylaxis    Honey   Peanut-Containing Drug Products Anaphylaxis   Hydromorphone Itching   Morphine And Related Itching   Sulfa Antibiotics Rash    Review of Systems Negative unless specially indicated above in HPI.    Objective:    Physical Exam Vitals and nursing note reviewed.  Constitutional:      General: She is not in  acute distress.    Appearance: She is not ill-appearing, toxic-appearing or diaphoretic.  Cardiovascular:     Rate and Rhythm: Normal rate and regular rhythm.     Heart sounds: Normal heart sounds. No murmur heard. Pulmonary:     Effort: Pulmonary effort is normal. No respiratory distress.     Breath sounds: Normal breath sounds.  Abdominal:     General: There is no distension.     Palpations: Abdomen is soft.     Tenderness: There is no abdominal tenderness. There is no right CVA tenderness, left CVA tenderness, guarding or rebound.  Musculoskeletal:     Right lower leg: No edema.     Left lower leg: No edema.  Skin:    General: Skin is warm and dry.  Neurological:     General: No focal deficit present.     Mental Status: She is alert and oriented to person, place, and time.  Psychiatric:        Mood and Affect: Mood normal.        Behavior: Behavior normal.    BP 109/70   Pulse (!) 59   Temp 98.2 F (36.8 C) (Temporal)   Ht 5\' 4"  (1.626 m)   Wt 242 lb 8 oz (110 kg)   LMP 11/23/2017 (Approximate)   BMI 41.63 kg/m  Wt Readings from Last 3 Encounters:  07/20/21 242 lb 8 oz (110 kg)  03/01/21 256 lb 6.4 oz (116.3 kg)  12/20/20 250 lb (113.4 kg)   Urine dipstick shows positive for RBC's, leuks, and ketones.  Micro exam: 0-5 WBC's per HPF, 0-2 RBC's per HPF, and few+ bacteria.   Health Maintenance Due  Topic Date Due   COVID-19 Vaccine (1) Never done   Pneumococcal Vaccine 39-9 Years old (1 - PCV) Never done   Hepatitis C Screening  Never done   COLONOSCOPY (Pts 45-76yrs Insurance coverage will need to be confirmed)  Never done   MAMMOGRAM  10/05/2018   TETANUS/TDAP  01/19/2020   INFLUENZA VACCINE  04/25/2021    There are no preventive care reminders to display for this patient.   Lab Results  Component Value Date   TSH 1.100 07/20/2017   Lab Results  Component Value Date   WBC 10.7 10/09/2018   HGB 13.3 10/09/2018   HCT 39.1 10/09/2018   MCV 91  10/09/2018   PLT 281 10/09/2018   Lab Results  Component Value Date   NA 140 10/09/2018   K 4.2 10/09/2018   CO2 22 10/09/2018   GLUCOSE 86 10/09/2018   BUN 13 10/09/2018  CREATININE 0.83 10/09/2018   BILITOT 0.3 10/09/2018   ALKPHOS 87 10/09/2018   AST 14 10/09/2018   ALT 19 10/09/2018   PROT 6.7 10/09/2018   ALBUMIN 4.5 10/09/2018   CALCIUM 9.8 10/09/2018   ANIONGAP 8 12/14/2017   Lab Results  Component Value Date   CHOL 161 07/20/2017   Lab Results  Component Value Date   HDL 61 07/20/2017   Lab Results  Component Value Date   LDLCALC 83 07/20/2017   Lab Results  Component Value Date   TRIG 85 07/20/2017   Lab Results  Component Value Date   CHOLHDL 2.6 07/20/2017   No results found for: HGBA1C     Assessment & Plan:   Iya was seen today for flank pain.  Diagnoses and all orders for this visit:  UTI symptoms Keflex as below. Culture pending.  -     Urinalysis, Routine w reflex microscopic -     cephALEXin (KEFLEX) 500 MG capsule; Take 1 capsule (500 mg total) by mouth 2 (two) times daily. -     Urine Culture  Primary hypertension Well controlled on current regimen. Refill provided, needs chronic follow up appointment with PCP for further refills.  -     nebivolol (BYSTOLIC) 10 MG tablet; Take 1 tablet (10 mg total) by mouth daily.  The patient indicates understanding of these issues and agrees with the plan.  Gabriel Earing, FNP

## 2021-07-22 ENCOUNTER — Telehealth: Payer: Self-pay | Admitting: Family

## 2021-07-22 DIAGNOSIS — R399 Unspecified symptoms and signs involving the genitourinary system: Secondary | ICD-10-CM

## 2021-07-22 MED ORDER — DOXYCYCLINE HYCLATE 100 MG PO TABS: 100.0000 mg | ORAL_TABLET | Freq: Two times a day (BID) | ORAL | 0 refills | Status: DC

## 2021-07-22 MED ORDER — DOXYCYCLINE HYCLATE 100 MG PO TABS: 100.0000 mg | ORAL_TABLET | Freq: Two times a day (BID) | ORAL | 0 refills | Status: AC

## 2021-07-22 NOTE — Telephone Encounter (Signed)
Pt came in for uti on 10/26 and was given antibiotics but said it is even worse today and wanted to see if there was anything else that she could do or take for it. Please call back and advise.

## 2021-07-22 NOTE — Addendum Note (Signed)
Addended by: Hessie Diener on: 07/22/2021 01:24 PM   Modules accepted: Orders

## 2021-07-22 NOTE — Telephone Encounter (Signed)
Pt aware of provider feedback and voiced understanding. She also requested the antibiotic be sent to [facility] instead of [facility]. Antibiotic sent to [facility] as requested.

## 2021-07-22 NOTE — Telephone Encounter (Signed)
I have sent in doxycyline instead. Discontinue keflex. Urine culture results are still pending. If symptoms do not improve she needs to follow up with an appointment.

## 2021-07-25 ENCOUNTER — Other Ambulatory Visit: Payer: Self-pay | Admitting: Family Medicine

## 2021-07-25 ENCOUNTER — Encounter: Payer: Self-pay | Admitting: Family Medicine

## 2021-07-25 DIAGNOSIS — B379 Candidiasis, unspecified: Secondary | ICD-10-CM

## 2021-07-25 LAB — URINE CULTURE

## 2021-07-25 MED ORDER — FLUCONAZOLE 150 MG PO TABS: 150.0000 mg | ORAL_TABLET | Freq: Once | ORAL | 0 refills | Status: AC

## 2021-07-26 ENCOUNTER — Telehealth: Payer: Self-pay | Admitting: Family

## 2021-07-26 NOTE — Telephone Encounter (Signed)
Pt called requesting to see Ashley Bradford sometime this week. Saw Tiffany on 10/26 for UTI but says she is not feeling any better and believes she may be having bladder spasms. Explained to pt that Ashley Bradford has a full schedule this week. Pt wants to know if Ashley Bradford can work her in to be seen?  Please advise and call patient.

## 2021-07-27 NOTE — Telephone Encounter (Signed)
Appt made

## 2021-07-28 ENCOUNTER — Encounter: Payer: Self-pay | Admitting: Family

## 2021-07-28 ENCOUNTER — Ambulatory Visit (INDEPENDENT_AMBULATORY_CARE_PROVIDER_SITE_OTHER): Payer: Self-pay | Admitting: Family

## 2021-07-28 DIAGNOSIS — N3289 Other specified disorders of bladder: Secondary | ICD-10-CM

## 2021-07-28 MED ORDER — TOLTERODINE TARTRATE 2 MG PO TABS
2.0000 mg | ORAL_TABLET | Freq: Two times a day (BID) | ORAL | 1 refills | Status: DC
Start: 2021-07-28 — End: 2021-09-30

## 2021-07-28 NOTE — Patient Instructions (Signed)
Interstitial Cystitis  Interstitial cystitis is inflammation of the bladder. This condition is also known as painful bladder syndrome. This may cause pain in the bladder area as well as a frequent and urgent need to urinate. The bladder is an organ that stores urine after the urine is made in the kidneys. The severity of interstitial cystitis can vary from person to person. You may have flare-ups, and then your symptoms may go away for a while. For many people, it becomes a long-term (chronic) problem. What are the causes? The cause of this condition is not known. What increases the risk? The following factors may make you more likely to develop this condition:  You are female.  You have fibromyalgia.  You have irritable bowel syndrome (IBS).  You have endometriosis. This condition may be aggravated by:  Stress.  Smoking.  Spicy foods. What are the signs or symptoms? Symptoms of interstitial cystitis vary, and they can change over time. Symptoms may include:  Discomfort or pain in the bladder area, which is in the lower abdomen. Pain can range from mild to severe. The pain may change in intensity as the bladder fills with urine or as it empties.  Pain in the pelvic area, between the hip bones.  An urgent need to urinate.  Frequent urination.  Pain during urination.  Pain during sex.  Blood in the urine.  Fatigue. For women, symptoms often get worse during menstruation. How is this diagnosed? This condition is diagnosed based on your symptoms, your medical history, and a physical exam. You may have tests to rule out other conditions, such as:  Urine tests.  Cystoscopy. For this test, a tool similar to a very thin telescope is used to look into your bladder.  Biopsy. This involves taking a sample of tissue from the bladder to be examined under a microscope. How is this treated? There is no cure for this condition, but treatment can help you control your symptoms. Work  closely with your health care provider to find the most effective treatments for you. Treatment options may include:  Medicines to relieve pain and reduce how often you feel the need to urinate. This treatment may include: ? A procedure where a small amount of medicine that eases irritation is put inside your bladder through a catheter (bladder instillation).  Lifestyle changes, such as changing your diet or taking steps to control stress.  Physical therapy. This may include: ? Exercises to help relax the pelvic floor muscles. ? Massage to relax tight muscles (myofascial release).  Learning ways to control when you urinate (bladder training).  Using a device that provides electrical stimulation to your nerves, which can relieve pain (neuromodulation therapy). The device is placed on your back, where it blocks the nerves that cause you to feel pain in your bladder area.  A procedure that stretches your bladder by filling it with air or fluid (hydrodistention).  Surgery. This is rare. It is only done for extreme cases, if other treatments do not help. Follow these instructions at home: Lifestyle  Learn and practice relaxation techniques, such as deep breathing and muscle relaxation.  Get care for your body and mental well-being, such as: ? Cognitive behavioral therapy (CBT). This therapy changes the way you think or act in response to the fatigue. This may help improve how you feel. ? Seeing a mental health therapist to evaluate and treat depression, if necessary.  Work with your health care provider on other ways to manage pain. Acupuncture may   be helpful.  Avoid drinking alcohol.  Do not use any products that contain nicotine or tobacco, such as cigarettes, e-cigarettes, and chewing tobacco. If you need help quitting, ask your health care provider. Eating and drinking  Make dietary changes as recommended by your health care provider. You may need to avoid: ? Spicy foods. ? Foods  that contain a lot of potassium.  Limit your intake of drinks that make you need to urinate. These include: ? Caffeinated drinks like soda, coffee, and tea. ? Alcohol. Bladder training  Use bladder training techniques as directed. Techniques may include: ? Urinating at scheduled times. ? Training yourself to delay urination.  Keep a bladder diary. ? Write down the times that you urinate and any symptoms that you have. This can help you find out which foods, liquids, or activities make your symptoms worse. ? Use your bladder diary to schedule bathroom trips. If you are away from home, plan to be near a bathroom at each of your scheduled times.  Make sure that you urinate just before you leave the house and just before you go to bed.   General instructions  Take over-the-counter and prescription medicines only as told by your health care provider.  You can try a warm or cool compress over your bladder for comfort.  Avoid wearing tight clothing.  Do exercises to relax your pelvic floor muscles as told by your physical therapist.  Keep all follow-up visits as told by your health care provider. This is important. Where to find more information To find more information or a support group near you, visit:  [employer]: urologyhealth.org  Interstitial Cystitis Association: ichelp.org Contact a health care provider if you have:  Symptoms that do not get better with treatment.  Pain or discomfort that gets worse.  More frequent urges to urinate.  A fever. Get help right away if:  You have no control over when you urinate. Summary  Interstitial cystitis is inflammation of the bladder.  This condition may cause pain in the bladder area as well as a frequent and urgent need to urinate.  You may have flare-ups of the condition, and then it may go away for a while. For many people, it becomes a long-term (chronic) problem.  There is no cure for interstitial cystitis,  but treatment methods are available to control your symptoms. This information is not intended to replace advice given to you by your health care provider. Make sure you discuss any questions you have with your health care provider. Document Revised: 10/29/2019 Document Reviewed: 08/06/2017 Elsevier Patient Education  2021 Elsevier Inc.  

## 2021-07-28 NOTE — Progress Notes (Signed)
Virtual Visit Consent   Ashley Bradford, you are scheduled for a virtual visit with a [facility] provider today.     Just as with appointments in the office, your consent must be obtained to participate.  Your consent will be active for this visit and any virtual visit you may have with one of our providers in the next 365 days.     If you have a MyChart account, a copy of this consent can be sent to you electronically.  All virtual visits are billed to your insurance company just like a traditional visit in the office.    As this is a virtual visit, video technology does not allow for your provider to perform a traditional examination.  This may limit your provider's ability to fully assess your condition.  If your provider identifies any concerns that need to be evaluated in person or the need to arrange testing (such as labs, EKG, etc.), we will make arrangements to do so.     Although advances in technology are sophisticated, we cannot ensure that it will always work on either your end or our end.  If the connection with a video visit is poor, the visit may have to be switched to a telephone visit.  With either a video or telephone visit, we are not always able to ensure that we have a secure connection.     I need to obtain your verbal consent now.   Are you willing to proceed with your visit today?    Ashley Bradford has provided verbal consent on 07/28/2021 for a virtual visit (video or telephone).   Jannifer Rodney, FNP   Date: 07/28/2021 12:36 PM   Virtual Visit via Video Note   I, Jannifer Rodney, connected with  Ashley Bradford  ([id number], January 01, 1971) on 07/28/21 at 11:40 AM EDT by a video-enabled telemedicine application and verified that I am speaking with the correct person using two identifiers.  Location: Patient: Virtual Visit Location Patient: Other: work Provider: Pharmacist, community: Office/Clinic   I discussed the limitations of evaluation and management by  telemedicine and the availability of in person appointments. The patient expressed understanding and agreed to proceed.    History of Present Illness: Ashley Bradford is a 50 y.o. who identifies as a female who was assigned female at birth, and is being seen today for bladder spasms. She was seen on 07/20/21 and given Keflex. Her Urine culture negative. She reports her pain continues.   HPI: HPI  Problems:  Patient Active Problem List   Diagnosis Date Noted   Controlled substance agreement signed 03/01/2021   Postoperative fever 12/12/2017   Menorrhagia 12/07/2017   S/P laparoscopic hysterectomy 12/06/2017   Vitamin D deficiency 07/21/2017   Iron deficiency anemia 07/21/2017   Asthma 07/20/2017   Peripheral edema 07/17/2016   ADHD, adult residual type 07/17/2016   Insomnia 05/12/2014   Hypertension 02/26/2013   GERD (gastroesophageal reflux disease) 02/26/2013   Morbid obesity (HCC) 02/26/2013    Allergies:  Allergies  Allergen Reactions   Other Anaphylaxis    Honey   Peanut-Containing Drug Products Anaphylaxis   Hydromorphone Itching   Morphine And Related Itching   Sulfa Antibiotics Rash   Medications:  Current Outpatient Medications:    tolterodine (DETROL) 2 MG tablet, Take 1 tablet (2 mg total) by mouth 2 (two) times daily., Disp: 60 tablet, Rfl: 1   doxycycline (VIBRA-TABS) 100 MG tablet, Take 1 tablet (100 mg total) by mouth  2 (two) times daily for 7 days. 1 po bid, Disp: 14 tablet, Rfl: 0   EPINEPHrine 0.3 mg/0.3 mL IJ SOAJ injection, Inject 0.3 mLs (0.3 mg total) into the muscle once as needed for up to 1 dose for anaphylaxis. (Patient not taking: Reported on 07/20/2021), Disp: 1 Device, Rfl: 0   ibuprofen (ADVIL,MOTRIN) 800 MG tablet, Take 1 tablet (800 mg total) by mouth 2 (two) times daily as needed., Disp: 60 tablet, Rfl: 3   nebivolol (BYSTOLIC) 10 MG tablet, Take 1 tablet (10 mg total) by mouth daily., Disp: 30 tablet, Rfl: 0   triamcinolone ointment (KENALOG) 0.5  %, Apply 1 application topically 2 (two) times daily., Disp: 60 g, Rfl: 1   zolpidem (AMBIEN) 10 MG tablet, Take 1 tablet (10 mg total) by mouth at bedtime as needed for sleep., Disp: 90 tablet, Rfl: 1  Observations/Objective: Patient is well-developed, well-nourished in no acute distress.  Resting comfortably at work.  Head is normocephalic, atraumatic.  No labored breathing.  Speech is clear and coherent with logical content.  Patient is alert and oriented at baseline.    Assessment and Plan: 1. Bladder spasms - tolterodine (DETROL) 2 MG tablet; Take 1 tablet (2 mg total) by mouth 2 (two) times daily.  Dispense: 60 tablet; Refill: 1 Start Detrol BID Avoid caffeine Needs to follow up with Urologists   Follow Up Instructions: I discussed the assessment and treatment plan with the patient. The patient was provided an opportunity to ask questions and all were answered. The patient agreed with the plan and demonstrated an understanding of the instructions.  A copy of instructions were sent to the patient via MyChart unless otherwise noted below.     The patient was advised to call back or seek an in-person evaluation if the symptoms worsen or if the condition fails to improve as anticipated.  Time:  I spent 12 minutes with the patient via telehealth technology discussing the above problems/concerns.    Jannifer Rodney, FNP

## 2021-09-08 ENCOUNTER — Other Ambulatory Visit: Payer: Self-pay | Admitting: Family

## 2021-09-08 DIAGNOSIS — F5101 Primary insomnia: Secondary | ICD-10-CM

## 2021-09-09 ENCOUNTER — Telehealth: Payer: Self-pay | Admitting: Family

## 2021-09-09 ENCOUNTER — Other Ambulatory Visit: Payer: Self-pay | Admitting: Family

## 2021-09-09 DIAGNOSIS — F5101 Primary insomnia: Secondary | ICD-10-CM

## 2021-09-09 NOTE — Telephone Encounter (Signed)
Talked with pt that request is for a controlled medication and she has a Fish farm manager, this will have to be filled at her visit on 09/13/21. She states that she asked for it to be refilled at her visit in November. In looking at last refill it was done 03/01/21 for 6 mos but it did expire on 08/28/21, she was not able to get it filled when she went on 09/07/21

## 2021-09-09 NOTE — Telephone Encounter (Signed)
°  Prescription Request  09/09/2021  Is this a "Controlled Substance" medicine? yes  Have you seen your PCP in the last 2 weeks? NO  If YES, route message to pool  -  If NO, patient needs to be scheduled for appointment.  What is the name of the medication or equipment? Zolpidem 10 mg Patient is out  Have you contacted your pharmacy to request a refill? yes   Which pharmacy would you like this sent to? [facility] in [place]   Patient notified that their request is being sent to the clinical staff for review and that they should receive a response within 2 business days.

## 2021-09-13 ENCOUNTER — Encounter: Payer: Self-pay | Admitting: Family

## 2021-09-13 ENCOUNTER — Ambulatory Visit: Payer: Self-pay | Admitting: Family

## 2021-09-13 VITALS — BP 111/74 | HR 66 | Temp 97.9°F | Ht 64.0 in | Wt 226.0 lb

## 2021-09-13 DIAGNOSIS — J4520 Mild intermittent asthma, uncomplicated: Secondary | ICD-10-CM

## 2021-09-13 DIAGNOSIS — D509 Iron deficiency anemia, unspecified: Secondary | ICD-10-CM

## 2021-09-13 DIAGNOSIS — K219 Gastro-esophageal reflux disease without esophagitis: Secondary | ICD-10-CM

## 2021-09-13 DIAGNOSIS — I10 Essential (primary) hypertension: Secondary | ICD-10-CM

## 2021-09-13 DIAGNOSIS — Z79899 Other long term (current) drug therapy: Secondary | ICD-10-CM

## 2021-09-13 DIAGNOSIS — E559 Vitamin D deficiency, unspecified: Secondary | ICD-10-CM

## 2021-09-13 DIAGNOSIS — F5101 Primary insomnia: Secondary | ICD-10-CM

## 2021-09-13 LAB — CMP14+EGFR
ALT: 168 IU/L — ABNORMAL HIGH (ref 0–32)
AST: 64 IU/L — ABNORMAL HIGH (ref 0–40)
Albumin/Globulin Ratio: 1.8 (ref 1.2–2.2)
Albumin: 4.2 g/dL (ref 3.8–4.8)
Alkaline Phosphatase: 91 IU/L (ref 44–121)
BUN/Creatinine Ratio: 27 — ABNORMAL HIGH (ref 9–23)
BUN: 21 mg/dL (ref 6–24)
Bilirubin Total: 0.4 mg/dL (ref 0.0–1.2)
CO2: 27 mmol/L (ref 20–29)
Calcium: 9.7 mg/dL (ref 8.7–10.2)
Chloride: 105 mmol/L (ref 96–106)
Creatinine, Ser: 0.77 mg/dL (ref 0.57–1.00)
Globulin, Total: 2.4 g/dL (ref 1.5–4.5)
Glucose: 85 mg/dL (ref 70–99)
Potassium: 4.3 mmol/L (ref 3.5–5.2)
Sodium: 145 mmol/L — ABNORMAL HIGH (ref 134–144)
Total Protein: 6.6 g/dL (ref 6.0–8.5)
eGFR: 94 mL/min/{1.73_m2} (ref >59)

## 2021-09-13 MED ORDER — ZOLPIDEM TARTRATE 10 MG PO TABS: 10.0000 mg | ORAL_TABLET | Freq: Every evening | ORAL | 1 refills | Status: DC | PRN

## 2021-09-13 MED ORDER — NEBIVOLOL HCL 10 MG PO TABS: 10.0000 mg | ORAL_TABLET | Freq: Every day | ORAL | 4 refills | Status: DC

## 2021-09-13 MED ORDER — METHYLPHENIDATE HCL ER (OSM) 36 MG PO TBCR: 36.0000 mg | EXTENDED_RELEASE_TABLET | Freq: Every day | ORAL | 0 refills | Status: DC

## 2021-09-13 NOTE — Progress Notes (Signed)
Subjective:    Patient ID: Ashley Bradford, female    DOB: 02/12/1971, 50 y.o.   MRN: [id number]  Chief Complaint  Patient presents with   Medical Management of Chronic Issues   PT presents to the office today for chronic follow up. She has ADHD and has taken Concerta in the past. She did not have insurance and stopped. Would like to restart to help focus at work.  Hypertension This is a chronic problem. The current episode started more than 1 year ago. The problem has been resolved since onset. The problem is controlled. Pertinent negatives include no malaise/fatigue, peripheral edema or shortness of breath. Risk factors for coronary artery disease include dyslipidemia, obesity and sedentary lifestyle. The current treatment provides moderate improvement.  Asthma There is no cough, shortness of breath or wheezing. This is a chronic problem. The current episode started more than 1 year ago. The problem occurs intermittently. Associated symptoms include heartburn. Pertinent negatives include no malaise/fatigue. She reports moderate improvement on treatment. Her past medical history is significant for asthma.  Gastroesophageal Reflux She complains of belching and heartburn. She reports no coughing or no wheezing. This is a chronic problem. The current episode started more than 1 year ago. The problem occurs occasionally. The problem has been waxing and waning. Risk factors include obesity. She has tried a PPI for the symptoms. The treatment provided moderate relief.  Anemia Presents for follow-up visit. There has been no confusion, leg swelling or malaise/fatigue.  Insomnia Primary symptoms: difficulty falling asleep, frequent awakening, no malaise/fatigue.   The current episode started more than one year. The onset quality is gradual. The problem occurs intermittently.     Review of Systems  Constitutional:  Negative for malaise/fatigue.  Respiratory:  Negative for cough, shortness of breath  and wheezing.   Gastrointestinal:  Positive for heartburn.  Psychiatric/Behavioral:  Negative for confusion. The patient has insomnia.   All other systems reviewed and are negative.     Objective:   Physical Exam Vitals reviewed.  Constitutional:      General: She is not in acute distress.    Appearance: She is well-developed. She is obese.  HENT:     Head: Normocephalic and atraumatic.     Right Ear: Tympanic membrane normal.     Left Ear: Tympanic membrane normal.  Eyes:     Pupils: Pupils are equal, round, and reactive to light.  Neck:     Thyroid: No thyromegaly.  Cardiovascular:     Rate and Rhythm: Normal rate and regular rhythm.     Heart sounds: Normal heart sounds. No murmur heard. Pulmonary:     Effort: Pulmonary effort is normal. No respiratory distress.     Breath sounds: Normal breath sounds. No wheezing.  Abdominal:     General: Bowel sounds are normal. There is no distension.     Palpations: Abdomen is soft.     Tenderness: There is no abdominal tenderness.  Musculoskeletal:        General: No tenderness. Normal range of motion.     Cervical back: Normal range of motion and neck supple.  Skin:    General: Skin is warm and dry.  Neurological:     Mental Status: She is alert and oriented to person, place, and time.     Cranial Nerves: No cranial nerve deficit.     Deep Tendon Reflexes: Reflexes are normal and symmetric.  Psychiatric:        Behavior: Behavior normal.  Thought Content: Thought content normal.        Judgment: Judgment normal.         BP 111/74    Pulse 66    Temp 97.9 F (36.6 C) (Temporal)    Ht _0  (1.626 m)    Wt 226 lb (102.5 kg)    LMP 11/23/2017 (Approximate)    BMI 38.79 kg/m   Assessment & Plan:  LASHAWN BROMWELL comes in today with chief complaint of Medical Management of Chronic Issues   Diagnosis and orders addressed:  1. Primary hypertension - nebivolol (BYSTOLIC) 10 MG tablet; Take 1 tablet (10 mg total) by  mouth daily.  Dispense: 90 tablet; Refill: 4 - CMP14+EGFR  2. Primary insomnia - zolpidem (AMBIEN) 10 MG tablet; Take 1 tablet (10 mg total) by mouth at bedtime as needed for sleep.  Dispense: 90 tablet; Refill: 1 - CMP14+EGFR  3. Mild intermittent asthma without complication - HXT05+WPVX  4. Gastroesophageal reflux disease without esophagitis - CMP14+EGFR  5. Controlled substance agreement signed - CMP14+EGFR  6. Iron deficiency anemia, unspecified iron deficiency anemia type - CMP14+EGFR  7. Vitamin D deficiency - CMP14+EGFR  8. Morbid obesity ([place]) - CMP14+EGFR   Labs pending Patient reviewed in [place] controlled database, no flags noted. Contract and drug screen are up to date.  Health Maintenance reviewed Diet and exercise encouraged  Follow up plan: 3 months   Evelina Dun, FNP

## 2021-09-13 NOTE — Patient Instructions (Signed)

## 2021-09-15 ENCOUNTER — Other Ambulatory Visit: Payer: Self-pay | Admitting: Family

## 2021-09-15 DIAGNOSIS — R748 Abnormal levels of other serum enzymes: Secondary | ICD-10-CM

## 2021-09-30 ENCOUNTER — Other Ambulatory Visit: Payer: Self-pay | Admitting: Family

## 2021-09-30 DIAGNOSIS — N3289 Other specified disorders of bladder: Secondary | ICD-10-CM

## 2021-09-30 MED ORDER — TOLTERODINE TARTRATE 2 MG PO TABS: 2.0000 mg | ORAL_TABLET | Freq: Two times a day (BID) | ORAL | 1 refills | Status: DC

## 2021-09-30 NOTE — Telephone Encounter (Signed)
°  Prescription Request  09/30/2021  Is this a "Controlled Substance" medicine? no  Have you seen your PCP in the last 2 weeks? On 09-13-2021 with [facility]  If YES, route message to pool  -  If NO, patient needs to be scheduled for appointment.  What is the name of the medication or equipment? Detrol  Have you contacted your pharmacy to request a refill? yes   Which pharmacy would you like this sent to? [facility]   Patient notified that their request is being sent to the clinical staff for review and that they should receive a response within 2 business days.    Lendon Colonel' pt.  Please call pt.

## 2021-09-30 NOTE — Telephone Encounter (Signed)
NA/VM full refill sent to pharmacy 

## 2021-12-12 ENCOUNTER — Ambulatory Visit: Payer: Self-pay | Admitting: Family

## 2021-12-16 ENCOUNTER — Telehealth: Payer: Self-pay | Admitting: Family

## 2021-12-16 DIAGNOSIS — N3289 Other specified disorders of bladder: Secondary | ICD-10-CM

## 2021-12-16 DIAGNOSIS — I10 Essential (primary) hypertension: Secondary | ICD-10-CM

## 2021-12-16 MED ORDER — NEBIVOLOL HCL 10 MG PO TABS: 10.0000 mg | ORAL_TABLET | Freq: Every day | ORAL | 3 refills | Status: DC

## 2021-12-16 MED ORDER — TOLTERODINE TARTRATE 2 MG PO TABS: 2.0000 mg | ORAL_TABLET | Freq: Two times a day (BID) | ORAL | 1 refills | Status: DC

## 2021-12-16 NOTE — Addendum Note (Signed)
Addended by: Ignacia Bayley on: 12/16/2021 04:33 PM ? ? Modules accepted: Orders ? ?

## 2021-12-16 NOTE — Telephone Encounter (Signed)
?  Prescription Request ? ?12/16/2021 ? ?Is this a "Controlled Substance" medicine? no ? ?Have you seen your PCP in the last 2 weeks? no ? ?If YES, route message to pool  -  If NO, patient needs to be scheduled for appointment. ? ?What is the name of the medication or equipment? Detrol 2 mg ? ?Have you contacted your pharmacy to request a refill? No  ? ?Which pharmacy would you like this sent to? [facility] - P# [phone] ? ? ?Patient notified that their request is being sent to the clinical staff for review and that they should receive a response within 2 business days.  ?  ?

## 2021-12-16 NOTE — Telephone Encounter (Signed)
LMOVM that  found in the pharmacy in our database as [facility] with a slightly different ph# but at [address] and sent the refill there. ?

## 2021-12-29 ENCOUNTER — Telehealth: Payer: Self-pay | Admitting: Family

## 2021-12-29 NOTE — Telephone Encounter (Signed)
Left message to schedule mychart video visit today with hawks ?

## 2022-01-03 NOTE — Telephone Encounter (Signed)
No return call will close chart  ?

## 2022-01-17 ENCOUNTER — Encounter: Payer: Self-pay | Admitting: Family

## 2022-01-18 ENCOUNTER — Encounter: Payer: Self-pay | Admitting: Family

## 2022-02-17 ENCOUNTER — Telehealth: Payer: Self-pay | Admitting: Family

## 2022-02-17 ENCOUNTER — Encounter: Payer: Self-pay | Admitting: Family

## 2022-02-17 ENCOUNTER — Ambulatory Visit: Payer: No Typology Code available for payment source | Admitting: Family

## 2022-02-17 VITALS — BP 109/67 | HR 54 | Temp 97.8°F | Ht 64.0 in | Wt 219.8 lb

## 2022-02-17 DIAGNOSIS — I10 Essential (primary) hypertension: Secondary | ICD-10-CM

## 2022-02-17 DIAGNOSIS — F5101 Primary insomnia: Secondary | ICD-10-CM

## 2022-02-17 DIAGNOSIS — K219 Gastro-esophageal reflux disease without esophagitis: Secondary | ICD-10-CM | POA: Diagnosis not present

## 2022-02-17 DIAGNOSIS — D509 Iron deficiency anemia, unspecified: Secondary | ICD-10-CM

## 2022-02-17 DIAGNOSIS — J4520 Mild intermittent asthma, uncomplicated: Secondary | ICD-10-CM | POA: Diagnosis not present

## 2022-02-17 DIAGNOSIS — Z79899 Other long term (current) drug therapy: Secondary | ICD-10-CM

## 2022-02-17 DIAGNOSIS — F908 Attention-deficit hyperactivity disorder, other type: Secondary | ICD-10-CM

## 2022-02-17 MED ORDER — METHYLPHENIDATE HCL ER (OSM) 36 MG PO TBCR: 36.0000 mg | EXTENDED_RELEASE_TABLET | Freq: Every day | ORAL | 0 refills | Status: DC

## 2022-02-17 MED ORDER — ZOLPIDEM TARTRATE 10 MG PO TABS: 10.0000 mg | ORAL_TABLET | Freq: Every evening | ORAL | 0 refills | Status: DC | PRN

## 2022-02-17 NOTE — Telephone Encounter (Signed)
Appt made

## 2022-02-17 NOTE — Telephone Encounter (Signed)
  Prescription Request  02/17/2022  What is the name of the medication or equipment? AMBIEN and CONCERTA  Have you contacted your pharmacy to request a refill? YES  Which pharmacy would you like this sent to? [facility] [place]  Pt scheduled to see PCP in July but will run out of her meds before her appt. Needs enough called in to the pharmacy to last her until her appt.

## 2022-02-17 NOTE — Telephone Encounter (Signed)
Needs an appointment for controlled medications. Sorry.

## 2022-02-17 NOTE — Telephone Encounter (Signed)
Mychart message sent.

## 2022-02-17 NOTE — Patient Instructions (Signed)
Attention Deficit Hyperactivity Disorder, Adult Attention deficit hyperactivity disorder (ADHD) is a mental health disorder that starts during childhood (neurodevelopmental disorder). For many people with ADHD, the disorder continues into the adult years. Treatment can help you manage your symptoms. What are the causes? The exact cause of ADHD is not known. Most experts believe genetics and environmental factors contribute to ADHD. What increases the risk? The following factors may make you more likely to develop this condition: Having a family history of ADHD. Being female. Being born to a mother who smoked or drank alcohol during pregnancy. Being exposed to lead or other toxins in the womb or early in life. Being born before 37 weeks of pregnancy (prematurely) or at a low birth weight. Having experienced a brain injury. What are the signs or symptoms? Symptoms of this condition depend on the type of ADHD. The two main types are inattentive and hyperactive-impulsive. Some people may have symptoms of both types. Symptoms of the inattentive type include: Difficulty paying attention. Making careless mistakes. Not following instructions. Being disorganized. Avoiding tasks that require time and attention. Losing and forgetting things. Being easily distracted. Symptoms of the hyperactive-impulsive type include: Restlessness. Talking too much. Interrupting. Difficulty with: Sitting still. Feeling motivated. Relaxing. Waiting in line or waiting for a turn. In adults, this condition may lead to certain problems, such as: Keeping jobs. Performing tasks at work. Having stable relationships. Being on time or keeping to a schedule. How is this diagnosed? This condition is diagnosed based on your current symptoms and your history of symptoms. The diagnosis can be made by a health care provider such as a primary care provider or a mental health care specialist. Your health care provider may use  a symptom checklist or a behavior rating scale to evaluate your symptoms. He or she may also want to talk with people who have observed your behaviors throughout your life. How is this treated? This condition can be treated with medicines and behavior therapy. Medicines may be the best option to reduce impulsive behaviors and improve attention. Your health care provider may recommend: Stimulant medicines. These are the most common medicines used for adult ADHD. They affect certain chemicals in the brain (neurotransmitters) and improve your ability to control your symptoms. A non-stimulant medicine for adult ADHD (atomoxetine). This medicine increases a neurotransmitter called norepinephrine. It may take weeks to months to see effects from this medicine. Counseling and behavioral management are also important for treating ADHD. Counseling is often used along with medicine. Your health care provider may suggest: Cognitive behavioral therapy (CBT). This type of therapy teaches you to replace negative thoughts and actions with positive thoughts and actions. When used as part of ADHD treatment, this therapy may also include: Coping strategies for organization, time management, impulse control, and stress reduction. Mindfulness and meditation training. Behavioral management. You may work with a coach who is specially trained to help people with ADHD manage and organize activities and function more effectively. Follow these instructions at home: Medicines  Take over-the-counter and prescription medicines only as told by your health care provider. Talk with your health care provider about the possible side effects of your medicines and how to manage them. Lifestyle  Do not use drugs. Do not drink alcohol if: Your health care provider tells you not to drink. You are pregnant, may be pregnant, or are planning to become pregnant. If you drink alcohol: Limit how much you use to: 0-1 drink a day for  women. 0-2 drinks a day   for men. Be aware of how much alcohol is in your drink. In the [place] one drink equals one 12 oz bottle of beer (355 mL), one 5 oz glass of wine (148 mL), or one 1 oz glass of hard liquor (44 mL). Get enough sleep. Eat a healthy diet. Exercise regularly. Exercise can help to reduce stress and anxiety. General instructions Learn as much as you can about adult ADHD, and work closely with your health care providers to find the treatments that work best for you. Follow the same schedule each day. Use reminder devices like notes, calendars, and phone apps to stay on time and organized. Keep all follow-up visits as told by your health care provider and therapist. This is important. Where to find more information A health care provider may be able to recommend resources that are available online or over the phone. You could start with: Attention Deficit Disorder Association (ADDA): www.add.org National Institute of Mental Health (NIMH): www.nimh.nih.gov Contact a health care provider if: Your symptoms continue to cause problems. You have side effects from your medicine, such as: Repeated muscle twitches, coughing, or speech outbursts. Sleep problems. Loss of appetite. Dizziness. Unusually fast heartbeat. Stomach pains. Headaches. You are struggling with anxiety, depression, or substance abuse. Get help right away if you: Have a severe reaction to a medicine. If you ever feel like you may hurt yourself or others, or have thoughts about taking your own life, get help right away. You can go to the nearest emergency department or call: Your local emergency services (911 in the [place] A suicide crisis helpline, such as the National Suicide Prevention Lifeline at 1-800-273-8255 or 988 in the [place] This is open 24 hours a day. Summary ADHD is a mental health disorder that starts during childhood (neurodevelopmental disorder) and often continues into the adult years. The  exact cause of ADHD is not known. Most experts believe genetics and environmental factors contribute to ADHD. There is no cure for ADHD, but treatment with medicine, cognitive behavioral therapy, or behavioral management can help you manage your condition. This information is not intended to replace advice given to you by your health care provider. Make sure you discuss any questions you have with your health care provider. Document Revised: 04/06/2021 Document Reviewed: 02/03/2019 Elsevier Patient Education  2023 Elsevier Inc.  

## 2022-02-17 NOTE — Progress Notes (Signed)
Subjective:    Patient ID: Ashley Bradford, female    DOB: May 21, 1971, 51 y.o.   MRN: [id number]  Chief Complaint  Patient presents with   Medical Management of Chronic Issues   PT presents to the office today for chronic follow up. She has ADHD and currently taking Concerta 36 mg. States this is helping when she remembers to take it.   PT is morbid obese with HTN and GERD.  Hypertension This is a chronic problem. The current episode started more than 1 year ago. The problem has been resolved since onset. The problem is controlled. Associated symptoms include peripheral edema. Pertinent negatives include no malaise/fatigue or shortness of breath. Risk factors for coronary artery disease include dyslipidemia, obesity and sedentary lifestyle. The current treatment provides moderate improvement. There is no history of heart failure.  Asthma There is no cough, shortness of breath or wheezing. This is a chronic problem. The current episode started more than 1 year ago. The problem occurs intermittently. The problem has been waxing and waning. Associated symptoms include heartburn. Pertinent negatives include no malaise/fatigue. Her symptoms are alleviated by rest. She reports moderate improvement on treatment. Her past medical history is significant for asthma.  Gastroesophageal Reflux She complains of belching and heartburn. She reports no coughing or no wheezing. This is a chronic problem. The current episode started more than 1 year ago. The problem occurs occasionally.  Insomnia Primary symptoms: difficulty falling asleep, frequent awakening, no malaise/fatigue.   The current episode started more than one year. The onset quality is gradual.  Anemia Presents for follow-up visit. There has been no malaise/fatigue. There is no history of heart failure.     Review of Systems  Constitutional:  Negative for malaise/fatigue.  Respiratory:  Negative for cough, shortness of breath and wheezing.    Gastrointestinal:  Positive for heartburn.  Psychiatric/Behavioral:  The patient has insomnia.   All other systems reviewed and are negative.     Objective:   Physical Exam Vitals reviewed.  Constitutional:      General: She is not in acute distress.    Appearance: She is well-developed. She is obese.  HENT:     Head: Normocephalic and atraumatic.     Right Ear: Tympanic membrane normal.     Left Ear: Tympanic membrane normal.  Eyes:     Pupils: Pupils are equal, round, and reactive to light.  Neck:     Thyroid: No thyromegaly.  Cardiovascular:     Rate and Rhythm: Normal rate and regular rhythm.     Heart sounds: Normal heart sounds. No murmur heard. Pulmonary:     Effort: Pulmonary effort is normal. No respiratory distress.     Breath sounds: Normal breath sounds. No wheezing.  Abdominal:     General: Bowel sounds are normal. There is no distension.     Palpations: Abdomen is soft.     Tenderness: There is no abdominal tenderness.  Musculoskeletal:        General: No tenderness. Normal range of motion.     Cervical back: Normal range of motion and neck supple.  Skin:    General: Skin is warm and dry.  Neurological:     Mental Status: She is alert and oriented to person, place, and time.     Cranial Nerves: No cranial nerve deficit.     Deep Tendon Reflexes: Reflexes are normal and symmetric.  Psychiatric:        Behavior: Behavior normal.  Thought Content: Thought content normal.        Judgment: Judgment normal.      BP 109/67   Pulse (!) 54   Temp 97.8 F (36.6 C)   Ht 5\' 4"  (1.626 m)   Wt 219 lb 12.8 oz (99.7 kg)   LMP 11/23/2017 (Approximate)   SpO2 98%   BMI 37.73 kg/m      Assessment & Plan:  Ashley Bradford comes in today with chief complaint of Medical Management of Chronic Issues   Diagnosis and orders addressed:  1. Controlled substance agreement signed - [employer] Select 13 (MW), Urine  2. Primary hypertension  3. Mild  intermittent asthma without complication  4. Gastroesophageal reflux disease without esophagitis   5. Morbid obesity (HCC)  6. Iron deficiency anemia, unspecified iron deficiency anemia type  7. Primary insomnia - zolpidem (AMBIEN) 10 MG tablet; Take 1 tablet (10 mg total) by mouth at bedtime as needed for sleep.  Dispense: 90 tablet; Refill: 0  8. ADHD, adult residual type Meds as prescribed Behavior modification as needed Follow-up for recheck in 3 months - methylphenidate (CONCERTA) 36 MG PO CR tablet; Take 1 tablet (36 mg total) by mouth daily before breakfast.  Dispense: 30 tablet; Refill: 0 - methylphenidate (CONCERTA) 36 MG PO CR tablet; Take 1 tablet (36 mg total) by mouth daily before breakfast.  Dispense: 30 tablet; Refill: 0 - methylphenidate (CONCERTA) 36 MG PO CR tablet; Take 1 tablet (36 mg total) by mouth daily before breakfast.  Dispense: 30 tablet; Refill: 0   Labs pending Patient reviewed in [place] controlled database, no flags noted. Contract and drug screen up dated today Health Maintenance reviewed Diet and exercise encouraged  Follow up plan: 3 months    Ashley Hoover, FNP

## 2022-02-23 LAB — TOXASSURE SELECT 13 (MW), URINE

## 2022-03-27 ENCOUNTER — Encounter: Payer: Self-pay | Admitting: Family

## 2022-05-22 ENCOUNTER — Encounter: Payer: No Typology Code available for payment source | Admitting: Family

## 2022-05-30 ENCOUNTER — Ambulatory Visit: Payer: No Typology Code available for payment source | Admitting: Family

## 2022-06-07 ENCOUNTER — Other Ambulatory Visit: Payer: Self-pay | Admitting: Family

## 2022-06-07 DIAGNOSIS — N3289 Other specified disorders of bladder: Secondary | ICD-10-CM

## 2022-06-19 ENCOUNTER — Encounter: Payer: Self-pay | Admitting: Family

## 2022-06-19 ENCOUNTER — Ambulatory Visit: Payer: No Typology Code available for payment source | Admitting: Family

## 2022-06-19 VITALS — BP 122/77 | HR 68 | Temp 98.4°F | Ht 64.0 in | Wt 230.0 lb

## 2022-06-19 DIAGNOSIS — F5101 Primary insomnia: Secondary | ICD-10-CM

## 2022-06-19 DIAGNOSIS — J4520 Mild intermittent asthma, uncomplicated: Secondary | ICD-10-CM

## 2022-06-19 DIAGNOSIS — D509 Iron deficiency anemia, unspecified: Secondary | ICD-10-CM

## 2022-06-19 DIAGNOSIS — K219 Gastro-esophageal reflux disease without esophagitis: Secondary | ICD-10-CM

## 2022-06-19 DIAGNOSIS — Z79899 Other long term (current) drug therapy: Secondary | ICD-10-CM

## 2022-06-19 DIAGNOSIS — F908 Attention-deficit hyperactivity disorder, other type: Secondary | ICD-10-CM | POA: Diagnosis not present

## 2022-06-19 DIAGNOSIS — N3289 Other specified disorders of bladder: Secondary | ICD-10-CM | POA: Diagnosis not present

## 2022-06-19 DIAGNOSIS — L259 Unspecified contact dermatitis, unspecified cause: Secondary | ICD-10-CM

## 2022-06-19 DIAGNOSIS — I10 Essential (primary) hypertension: Secondary | ICD-10-CM

## 2022-06-19 DIAGNOSIS — F411 Generalized anxiety disorder: Secondary | ICD-10-CM

## 2022-06-19 DIAGNOSIS — E559 Vitamin D deficiency, unspecified: Secondary | ICD-10-CM

## 2022-06-19 DIAGNOSIS — R609 Edema, unspecified: Secondary | ICD-10-CM

## 2022-06-19 MED ORDER — NEBIVOLOL HCL 10 MG PO TABS: 10.0000 mg | ORAL_TABLET | Freq: Every day | ORAL | 3 refills | Status: DC

## 2022-06-19 MED ORDER — METHYLPHENIDATE HCL ER (OSM) 36 MG PO TBCR: 36.0000 mg | EXTENDED_RELEASE_TABLET | Freq: Every day | ORAL | 0 refills | Status: DC

## 2022-06-19 MED ORDER — BUSPIRONE HCL 5 MG PO TABS: 5.0000 mg | ORAL_TABLET | Freq: Three times a day (TID) | ORAL | 1 refills | Status: DC | PRN

## 2022-06-19 MED ORDER — ALBUTEROL SULFATE HFA 108 (90 BASE) MCG/ACT IN AERS: 2.0000 | INHALATION_SPRAY | Freq: Four times a day (QID) | RESPIRATORY_TRACT | 0 refills | Status: DC | PRN

## 2022-06-19 MED ORDER — TOLTERODINE TARTRATE 2 MG PO TABS: 2.0000 mg | ORAL_TABLET | Freq: Two times a day (BID) | ORAL | 0 refills | Status: DC

## 2022-06-19 MED ORDER — ZOLPIDEM TARTRATE 10 MG PO TABS: 10.0000 mg | ORAL_TABLET | Freq: Every evening | ORAL | 0 refills | Status: DC | PRN

## 2022-06-19 MED ORDER — TRIAMCINOLONE ACETONIDE 0.5 % EX OINT: 1.0000 | TOPICAL_OINTMENT | Freq: Two times a day (BID) | CUTANEOUS | 1 refills | Status: DC

## 2022-06-19 MED ORDER — EPINEPHRINE 0.3 MG/0.3ML IJ SOAJ: 0.3000 mg | Freq: Once | INTRAMUSCULAR | 0 refills | Status: DC | PRN

## 2022-06-19 MED ORDER — IBUPROFEN 800 MG PO TABS: 800.0000 mg | ORAL_TABLET | Freq: Two times a day (BID) | ORAL | 3 refills | Status: DC | PRN

## 2022-06-19 NOTE — Patient Instructions (Signed)
Attention Deficit Hyperactivity Disorder, Adult Attention deficit hyperactivity disorder (ADHD) is a mental health disorder that starts during childhood. For many people with ADHD, the disorder continues into the adult years. Treatment can help you manage your symptoms. There are three main types of ADHD: Inattentive. With this type, adults have difficulty paying attention. This may affect cognitive abilities. Hyperactive-impulsive. With this type, adults have a lot of energy and have difficulty controlling their behavior. Combination type. Some people may have symptoms of both types. What are the causes? The exact cause of ADHD is not known. Most experts believe a person's genes and environment possibly contribute to ADHD. What increases the risk? The following factors may make you more likely to develop this condition: Having a first-degree relative such as a parent, brother, or sister, with the condition. Being born before 37 weeks of pregnancy (prematurely) or at a low birth weight. Being born to a mother who smoked tobacco or drank alcohol during pregnancy. Having experienced a brain injury. Being exposed to lead or other toxins in the womb or early in life. What are the signs or symptoms? Symptoms of this condition depend on the type of ADHD. Symptoms of the inattentive type include: Difficulty paying attention or following instructions. Often making simple mistakes. Being disorganized. Avoiding tasks that require time and attention. Losing and forgetting things. Symptoms of the hyperactive-impulsive type include: Restlessness. Talking out of turn, interrupting others, or talking too much. Difficulty with: Sitting still. Feeling motivated. Relaxing. Waiting in line or waiting for a turn. People with the combination type have symptoms of both of the other types. In adults, this condition may lead to certain problems, such as: Keeping jobs. Performing tasks at work. Having  stable relationships. Being on time or keeping to a schedule. How is this diagnosed? This condition is diagnosed based on your current symptoms and your history of symptoms. The diagnosis can be made by a health care provider such as a primary care provider or a mental health care specialist. Your health care provider may use a symptom checklist or a behavior rating scale to evaluate your symptoms. Your health care provider may also want to talk with people who have observed your behaviors throughout your life. How is this treated? This condition can be treated with medicines and behavior therapy. Medicines may be the best option to reduce impulsive behaviors and improve attention. Your health care provider may recommend: Stimulant medicines. These are the most common medicines used for adult ADHD. They affect certain chemicals in the brain (neurotransmitters) and improve your ability to control your symptoms. A non-stimulant medicine. These medicines can also improve focus, attention, and impulsive behavior. It may take weeks to months to see the effects of this medicine. Counseling and behavioral management are also important for treating ADHD. Counseling is often used along with medicine. Your health care provider may suggest: Cognitive behavioral therapy (CBT). This type of therapy teaches you to replace negative thoughts and actions with positive thoughts and actions. When used as part of ADHD treatment, this therapy may also include: Coping strategies for organization, time management, impulse control, and stress reduction. Mindfulness and meditation training. Behavioral management. You may work with a coach who is specially trained to help people with ADHD manage and organize activities and function more effectively. Follow these instructions at home: Medicines  Take over-the-counter and prescription medicines only as told by your health care provider. Talk with your health care provider  about the possible side effects of your medicines and   how to manage them. Alcohol use Do not drink alcohol if: Your health care provider tells you not to drink. You are pregnant, may be pregnant, or are planning to become pregnant. If you drink alcohol: Limit how much you use to: 0-1 drink a day for women. 0-2 drinks a day for men. Know how much alcohol is in your drink. In the [place] one drink equals one 12 oz bottle of beer (355 mL), one 5 oz glass of wine (148 mL), or one 1 oz glass of hard liquor (44 mL). Lifestyle  Do not use illegal drugs. Get enough sleep. Eat a healthy diet. Exercise regularly. Exercise can help to reduce stress and anxiety. General instructions Learn as much as you can about adult ADHD, and work closely with your health care providers to find the treatments that work best for you. Follow the same schedule each day. Use reminder devices like notes, calendars, and phone apps to stay on time and organized. Keep all follow-up visits. Your health care provider will need to monitor your condition and adjust your treatment over time. Where to find more information A health care provider may be able to recommend resources that are available online or over the phone. You could start with: Attention Deficit Disorder Association (ADDA): add.org National Institute of Mental Health (NIMH): nimh.nih.gov Contact a health care provider if: Your symptoms continue to cause problems. You have side effects from your medicine, such as: Repeated muscle twitches, coughing, or speech outbursts. Sleep problems. Loss of appetite. Dizziness. Unusually fast heartbeat. Stomach pains. Headaches. You are struggling with anxiety, depression, or substance abuse. Get help right away if: You have a severe reaction to a medicine. This symptom may be an emergency. Get help right away. Call 911. Do not wait to see if the symptom will go away. Do not drive yourself to the hospital. Take  one of these steps if you feel like you may hurt yourself or others, or have thoughts about taking your own life: Go to your nearest emergency room. Call 911. Call the National Suicide Prevention Lifeline at 1-800-273-8255 or 988. This is open 24 hours a day Text the Crisis Text Line at 741741. Summary ADHD is a mental health disorder that starts during childhood and often continues into your adult years. The exact cause of ADHD is not known. Most experts believe genetics and environmental factors contribute to ADHD. There is no cure for ADHD, but treatment with medicine, cognitive behavioral therapy, or behavioral management can help you manage your condition. This information is not intended to replace advice given to you by your health care provider. Make sure you discuss any questions you have with your health care provider. Document Revised: 12/30/2021 Document Reviewed: 12/30/2021 Elsevier Patient Education  2023 [employer] Inc.  

## 2022-06-19 NOTE — Progress Notes (Signed)
Subjective:    Patient ID: Ashley Bradford, female    DOB: Nov 21, 1970, 51 y.o.   MRN: [id number]  Chief Complaint  Patient presents with   Medical Management of Chronic Issues   PT presents to the office today for chronic follow up. She has ADHD and currently taking Concerta 36 mg. States this is helping when she remembers to take it.    PT is morbid obese with HTN and GERD.  Hypertension This is a chronic problem. The current episode started more than 1 year ago. The problem has been resolved since onset. The problem is controlled. Associated symptoms include anxiety. Pertinent negatives include no malaise/fatigue, peripheral edema or shortness of breath. Risk factors for coronary artery disease include dyslipidemia, obesity and sedentary lifestyle. The current treatment provides significant improvement. There is no history of CVA or heart failure.  Asthma There is no cough, shortness of breath or wheezing. This is a chronic problem. The current episode started more than 1 year ago. The problem occurs intermittently. Associated symptoms include heartburn. Pertinent negatives include no malaise/fatigue. She reports moderate improvement on treatment. Her past medical history is significant for asthma.  Gastroesophageal Reflux She complains of belching and heartburn. She reports no coughing or no wheezing. This is a chronic problem. The current episode started more than 1 year ago. The problem occurs occasionally. Risk factors include obesity. She has tried a PPI for the symptoms. The treatment provided moderate relief.  Anemia Presents for follow-up visit. There has been no bruising/bleeding easily or malaise/fatigue. There is no history of heart failure.  Insomnia Primary symptoms: difficulty falling asleep, frequent awakening, no malaise/fatigue.   The current episode started more than one year. The problem has been waxing and waning since onset. The treatment provided moderate relief.   Anxiety Presents for follow-up visit. Symptoms include depressed mood, excessive worry, insomnia, irritability and nervous/anxious behavior. Patient reports no shortness of breath. Symptoms occur most days. The severity of symptoms is mild.   Her past medical history is significant for anemia and asthma.      Review of Systems  Constitutional:  Positive for irritability. Negative for malaise/fatigue.  Respiratory:  Negative for cough, shortness of breath and wheezing.   Gastrointestinal:  Positive for heartburn.  Hematological:  Does not bruise/bleed easily.  Psychiatric/Behavioral:  The patient is nervous/anxious and has insomnia.   All other systems reviewed and are negative.      Objective:   Physical Exam Vitals reviewed.  Constitutional:      General: She is not in acute distress.    Appearance: She is well-developed. She is obese.  HENT:     Head: Normocephalic and atraumatic.     Right Ear: Tympanic membrane normal.     Left Ear: Tympanic membrane normal.  Eyes:     Pupils: Pupils are equal, round, and reactive to light.  Neck:     Thyroid: No thyromegaly.  Cardiovascular:     Rate and Rhythm: Normal rate and regular rhythm.     Heart sounds: Normal heart sounds. No murmur heard. Pulmonary:     Effort: Pulmonary effort is normal. No respiratory distress.     Breath sounds: Normal breath sounds. No wheezing.  Abdominal:     General: Bowel sounds are normal. There is no distension.     Palpations: Abdomen is soft.     Tenderness: There is no abdominal tenderness.  Musculoskeletal:        General: No tenderness. Normal range of motion.  Cervical back: Normal range of motion and neck supple.  Skin:    General: Skin is warm and dry.  Neurological:     Mental Status: She is alert and oriented to person, place, and time.     Cranial Nerves: No cranial nerve deficit.     Deep Tendon Reflexes: Reflexes are normal and symmetric.  Psychiatric:        Behavior:  Behavior normal.        Thought Content: Thought content normal.        Judgment: Judgment normal.       BP 122/77   Pulse 68   Temp 98.4 F (36.9 C) (Temporal)   Ht 5\' 4"  (1.626 m)   Wt 230 lb (104.3 kg)   LMP 11/23/2017 (Approximate)   BMI 39.48 kg/m      Assessment & Plan:  Ashley Bradford comes in today with chief complaint of Medical Management of Chronic Issues   Diagnosis and orders addressed:  1. ADHD, adult residual type Meds as prescribed Behavior modification as needed Follow-up for recheck in 3 months - methylphenidate (CONCERTA) 36 MG PO CR tablet; Take 1 tablet (36 mg total) by mouth daily before breakfast.  Dispense: 30 tablet; Refill: 0 - methylphenidate (CONCERTA) 36 MG PO CR tablet; Take 1 tablet (36 mg total) by mouth daily before breakfast.  Dispense: 30 tablet; Refill: 0 - methylphenidate (CONCERTA) 36 MG PO CR tablet; Take 1 tablet (36 mg total) by mouth daily before breakfast.  Dispense: 30 tablet; Refill: 0  2. Bladder spasms - tolterodine (DETROL) 2 MG tablet; Take 1 tablet (2 mg total) by mouth 2 (two) times daily.  Dispense: 60 tablet; Refill: 0  3. Contact dermatitis, unspecified contact dermatitis type, unspecified trigger - triamcinolone ointment (KENALOG) 0.5 %; Apply 1 Application topically 2 (two) times daily.  Dispense: 60 g; Refill: 1  4. Primary hypertension - nebivolol (BYSTOLIC) 10 MG tablet; Take 1 tablet (10 mg total) by mouth daily.  Dispense: 90 tablet; Refill: 3  5. Primary insomnia - zolpidem (AMBIEN) 10 MG tablet; Take 1 tablet (10 mg total) by mouth at bedtime as needed for sleep.  Dispense: 90 tablet; Refill: 0  6. Mild intermittent asthma without complication - albuterol (VENTOLIN HFA) 108 (90 Base) MCG/ACT inhaler; Inhale 2 puffs into the lungs every 6 (six) hours as needed for wheezing or shortness of breath.  Dispense: 8 g; Refill: 0  7. Gastroesophageal reflux disease without esophagitis  8. Iron deficiency anemia,  unspecified iron deficiency anemia type  9. Peripheral edema  10. Morbid obesity (HCC)  11. Controlled substance agreement signed - methylphenidate (CONCERTA) 36 MG PO CR tablet; Take 1 tablet (36 mg total) by mouth daily before breakfast.  Dispense: 30 tablet; Refill: 0 - methylphenidate (CONCERTA) 36 MG PO CR tablet; Take 1 tablet (36 mg total) by mouth daily before breakfast.  Dispense: 30 tablet; Refill: 0 - methylphenidate (CONCERTA) 36 MG PO CR tablet; Take 1 tablet (36 mg total) by mouth daily before breakfast.  Dispense: 30 tablet; Refill: 0 - zolpidem (AMBIEN) 10 MG tablet; Take 1 tablet (10 mg total) by mouth at bedtime as needed for sleep.  Dispense: 90 tablet; Refill: 0  12. Vitamin D deficiency  13. GAD (generalized anxiety disorder)  - busPIRone (BUSPAR) 5 MG tablet; Take 1 tablet (5 mg total) by mouth 3 (three) times daily as needed.  Dispense: 90 tablet; Refill: 1   Labs pending Health Maintenance reviewed Diet and exercise encouraged  Follow up plan: 3 months    Evelina Dun, FNP

## 2022-07-17 ENCOUNTER — Telehealth: Payer: Self-pay

## 2022-07-17 NOTE — Telephone Encounter (Signed)
Transition Care Management Follow-up Telephone Call Date of discharge and from where: 07/14/2022 [facility] How have you been since you were released from the hospital? Patient states that she is doing well - improving  Any questions or concerns? No  Items Reviewed: Did the pt receive and understand the discharge instructions provided? Yes  Medications obtained and verified? Yes  Other? Yes  Any new allergies since your discharge? No  Dietary orders reviewed? Yes Do you have support at home? Yes   Home Care and Equipment/Supplies: Were home health services ordered? not applicable If so, what is the name of the agency? na  Has the agency set up a time to come to the patient's home? not applicable Were any new equipment or medical supplies ordered?  No What is the name of the medical supply agency? na Were you able to get the supplies/equipment? not applicable Do you have any questions related to the use of the equipment or supplies? No  Functional Questionnaire: (I = Independent and D = Dependent) ADLs: I  Bathing/Dressing- I  Meal Prep- I  Eating- I  Maintaining continence- I  Transferring/Ambulation- I  Managing Meds- I  Follow up appointments reviewed:  PCP Hospital f/u appt confirmed?  DECLINES AT THIS TIME HAS CHRONIC FOLLOW UP IN DEC 2023 WILL CONTACT OFFICE IF APPT NEEDED BEFORE [facility] f/u appt confirmed? Yes  Scheduled to see GI on 08/28/2022  Are transportation arrangements needed? No  If their condition worsens, is the pt aware to call PCP or go to the Emergency Dept.? Yes Was the patient provided with contact information for the PCP's office or ED? Yes Was to pt encouraged to call back with questions or concerns? Yes

## 2022-09-11 ENCOUNTER — Ambulatory Visit: Payer: No Typology Code available for payment source | Admitting: Family

## 2022-09-11 ENCOUNTER — Encounter: Payer: Self-pay | Admitting: Family

## 2022-09-11 ENCOUNTER — Ambulatory Visit (INDEPENDENT_AMBULATORY_CARE_PROVIDER_SITE_OTHER): Payer: No Typology Code available for payment source | Admitting: Family

## 2022-09-11 VITALS — BP 127/81 | HR 57 | Temp 98.1°F | Ht 64.0 in | Wt 241.0 lb

## 2022-09-11 DIAGNOSIS — J4520 Mild intermittent asthma, uncomplicated: Secondary | ICD-10-CM

## 2022-09-11 DIAGNOSIS — F5101 Primary insomnia: Secondary | ICD-10-CM | POA: Diagnosis not present

## 2022-09-11 DIAGNOSIS — F411 Generalized anxiety disorder: Secondary | ICD-10-CM | POA: Insufficient documentation

## 2022-09-11 DIAGNOSIS — I10 Essential (primary) hypertension: Secondary | ICD-10-CM | POA: Diagnosis not present

## 2022-09-11 DIAGNOSIS — Z79899 Other long term (current) drug therapy: Secondary | ICD-10-CM

## 2022-09-11 DIAGNOSIS — Z23 Encounter for immunization: Secondary | ICD-10-CM

## 2022-09-11 DIAGNOSIS — E559 Vitamin D deficiency, unspecified: Secondary | ICD-10-CM | POA: Diagnosis not present

## 2022-09-11 DIAGNOSIS — D509 Iron deficiency anemia, unspecified: Secondary | ICD-10-CM

## 2022-09-11 DIAGNOSIS — F908 Attention-deficit hyperactivity disorder, other type: Secondary | ICD-10-CM

## 2022-09-11 DIAGNOSIS — K219 Gastro-esophageal reflux disease without esophagitis: Secondary | ICD-10-CM

## 2022-09-11 MED ORDER — BUSPIRONE HCL 5 MG PO TABS: 5.0000 mg | ORAL_TABLET | Freq: Three times a day (TID) | ORAL | 1 refills | Status: DC | PRN

## 2022-09-11 MED ORDER — LISDEXAMFETAMINE DIMESYLATE 30 MG PO CAPS: 30.0000 mg | ORAL_CAPSULE | Freq: Every day | ORAL | 0 refills | Status: DC

## 2022-09-11 MED ORDER — ZOLPIDEM TARTRATE 10 MG PO TABS: 10.0000 mg | ORAL_TABLET | Freq: Every evening | ORAL | 0 refills | Status: DC | PRN

## 2022-09-11 MED ORDER — NEBIVOLOL HCL 10 MG PO TABS: 10.0000 mg | ORAL_TABLET | Freq: Every day | ORAL | 3 refills | Status: DC

## 2022-09-11 NOTE — Progress Notes (Signed)
Subjective:    Patient ID: Ashley Bradford, female    DOB: [birth year], 51 y.o.   MRN: [id number]  Chief Complaint  Patient presents with   Medical Management of Chronic Issues    Concerta giving headaches and nausea    PT presents to the office today for chronic follow up. She has ADHD and currently taking Concerta 36 mg. States this is helping when she remembers to take it. States this is causing her headaches and nausea. Would like to try another medication.    PT is morbid obese with HTN and GERD.  Anemia Presents for follow-up visit. There has been no bruising/bleeding easily or malaise/fatigue.  Hypertension This is a chronic problem. The current episode started more than 1 year ago. The problem has been resolved since onset. The problem is controlled. Pertinent negatives include no malaise/fatigue, peripheral edema or shortness of breath. Risk factors for coronary artery disease include obesity and sedentary lifestyle. The current treatment provides moderate improvement.  Asthma There is no cough, shortness of breath or wheezing. This is a chronic problem. The current episode started more than 1 year ago. The problem occurs intermittently. Associated symptoms include heartburn. Pertinent negatives include no malaise/fatigue. Her symptoms are alleviated by rest. Her symptoms are not alleviated by rest. Her past medical history is significant for asthma.  Gastroesophageal Reflux She complains of belching and heartburn. She reports no coughing or no wheezing. This is a chronic problem. The current episode started more than 1 year ago. The problem occurs occasionally. She has tried a PPI for the symptoms. The treatment provided moderate relief.  Insomnia Primary symptoms: difficulty falling asleep, frequent awakening, no malaise/fatigue.   The current episode started more than one year. The onset quality is gradual. The problem occurs intermittently. Past treatments include meditation. The  treatment provided mild relief.      Review of Systems  Constitutional:  Negative for malaise/fatigue.  Respiratory:  Negative for cough, shortness of breath and wheezing.   Gastrointestinal:  Positive for heartburn.  Hematological:  Does not bruise/bleed easily.  Psychiatric/Behavioral:  The patient has insomnia.   All other systems reviewed and are negative.      Objective:   Physical Exam Vitals reviewed.  Constitutional:      General: She is not in acute distress.    Appearance: She is well-developed. She is obese.  HENT:     Head: Normocephalic and atraumatic.     Right Ear: Tympanic membrane normal.     Left Ear: Tympanic membrane normal.  Eyes:     Pupils: Pupils are equal, round, and reactive to light.  Neck:     Thyroid: No thyromegaly.  Cardiovascular:     Rate and Rhythm: Normal rate and regular rhythm.     Heart sounds: Normal heart sounds. No murmur heard. Pulmonary:     Effort: Pulmonary effort is normal. No respiratory distress.     Breath sounds: Normal breath sounds. No wheezing.  Abdominal:     General: Bowel sounds are normal. There is no distension.     Palpations: Abdomen is soft.     Tenderness: There is no abdominal tenderness.  Musculoskeletal:        General: No tenderness. Normal range of motion.     Cervical back: Normal range of motion and neck supple.  Skin:    General: Skin is warm and dry.  Neurological:     Mental Status: She is alert and oriented to person, place, and time.  Cranial Nerves: No cranial nerve deficit.     Deep Tendon Reflexes: Reflexes are normal and symmetric.  Psychiatric:        Behavior: Behavior normal.        Thought Content: Thought content normal.        Judgment: Judgment normal.       BP 127/81   Pulse (!) 57   Temp 98.1 F (36.7 C) (Temporal)   Ht 5\' 4"  (1.626 m)   Wt 241 lb (109.3 kg)   LMP 11/23/2017 (Approximate)   BMI 41.37 kg/m      Assessment & Plan:  Ashley Bradford comes in today  with chief complaint of Medical Management of Chronic Issues (Concerta giving headaches and nausea )   Diagnosis and orders addressed:  1. GAD (generalized anxiety disorder) - busPIRone (BUSPAR) 5 MG tablet; Take 1 tablet (5 mg total) by mouth 3 (three) times daily as needed.  Dispense: 90 tablet; Refill: 1  2. Primary hypertension - nebivolol (BYSTOLIC) 10 MG tablet; Take 1 tablet (10 mg total) by mouth daily.  Dispense: 90 tablet; Refill: 3  3. Primary insomnia - zolpidem (AMBIEN) 10 MG tablet; Take 1 tablet (10 mg total) by mouth at bedtime as needed for sleep.  Dispense: 90 tablet; Refill: 0  4. Controlled substance agreement signed - zolpidem (AMBIEN) 10 MG tablet; Take 1 tablet (10 mg total) by mouth at bedtime as needed for sleep.  Dispense: 90 tablet; Refill: 0 - lisdexamfetamine (VYVANSE) 30 MG capsule; Take 1 capsule (30 mg total) by mouth daily before breakfast.  Dispense: 30 capsule; Refill: 0 - lisdexamfetamine (VYVANSE) 30 MG capsule; Take 1 capsule (30 mg total) by mouth daily before breakfast.  Dispense: 30 capsule; Refill: 0 - lisdexamfetamine (VYVANSE) 30 MG capsule; Take 1 capsule (30 mg total) by mouth daily before breakfast.  Dispense: 30 capsule; Refill: 0  5. Vitamin D deficiency  6. Morbid obesity (HCC)  7. Gastroesophageal reflux disease without esophagitis  8. ADHD, adult residual type - lisdexamfetamine (VYVANSE) 30 MG capsule; Take 1 capsule (30 mg total) by mouth daily before breakfast.  Dispense: 30 capsule; Refill: 0 - lisdexamfetamine (VYVANSE) 30 MG capsule; Take 1 capsule (30 mg total) by mouth daily before breakfast.  Dispense: 30 capsule; Refill: 0 - lisdexamfetamine (VYVANSE) 30 MG capsule; Take 1 capsule (30 mg total) by mouth daily before breakfast.  Dispense: 30 capsule; Refill: 0  9. Mild intermittent asthma without complication  10. Iron deficiency anemia, unspecified iron deficiency anemia type    Changing Concerta to Vyvanse 30 mg   Patient reviewed in [place] controlled database, no flags noted. Contract and drug screen are up to date.  Health Maintenance reviewed Diet and exercise encouraged  Follow up plan: 3  months    Ashley Hoover, FNP

## 2022-09-11 NOTE — Patient Instructions (Signed)
Attention Deficit Hyperactivity Disorder, Adult Attention deficit hyperactivity disorder (ADHD) is a mental health disorder that starts during childhood. For many people with ADHD, the disorder continues into the adult years. Treatment can help you manage your symptoms. There are three main types of ADHD: Inattentive. With this type, adults have difficulty paying attention. This may affect cognitive abilities. Hyperactive-impulsive. With this type, adults have a lot of energy and have difficulty controlling their behavior. Combination type. Some people may have symptoms of both types. What are the causes? The exact cause of ADHD is not known. Most experts believe a person's genes and environment possibly contribute to ADHD. What increases the risk? The following factors may make you more likely to develop this condition: Having a first-degree relative such as a parent, brother, or sister, with the condition. Being born before 37 weeks of pregnancy (prematurely) or at a low birth weight. Being born to a mother who smoked tobacco or drank alcohol during pregnancy. Having experienced a brain injury. Being exposed to lead or other toxins in the womb or early in life. What are the signs or symptoms? Symptoms of this condition depend on the type of ADHD. Symptoms of the inattentive type include: Difficulty paying attention or following instructions. Often making simple mistakes. Being disorganized. Avoiding tasks that require time and attention. Losing and forgetting things. Symptoms of the hyperactive-impulsive type include: Restlessness. Talking out of turn, interrupting others, or talking too much. Difficulty with: Sitting still. Feeling motivated. Relaxing. Waiting in line or waiting for a turn. People with the combination type have symptoms of both of the other types. In adults, this condition may lead to certain problems, such as: Keeping jobs. Performing tasks at work. Having  stable relationships. Being on time or keeping to a schedule. How is this diagnosed? This condition is diagnosed based on your current symptoms and your history of symptoms. The diagnosis can be made by a health care provider such as a primary care provider or a mental health care specialist. Your health care provider may use a symptom checklist or a behavior rating scale to evaluate your symptoms. Your health care provider may also want to talk with people who have observed your behaviors throughout your life. How is this treated? This condition can be treated with medicines and behavior therapy. Medicines may be the best option to reduce impulsive behaviors and improve attention. Your health care provider may recommend: Stimulant medicines. These are the most common medicines used for adult ADHD. They affect certain chemicals in the brain (neurotransmitters) and improve your ability to control your symptoms. A non-stimulant medicine. These medicines can also improve focus, attention, and impulsive behavior. It may take weeks to months to see the effects of this medicine. Counseling and behavioral management are also important for treating ADHD. Counseling is often used along with medicine. Your health care provider may suggest: Cognitive behavioral therapy (CBT). This type of therapy teaches you to replace negative thoughts and actions with positive thoughts and actions. When used as part of ADHD treatment, this therapy may also include: Coping strategies for organization, time management, impulse control, and stress reduction. Mindfulness and meditation training. Behavioral management. You may work with a coach who is specially trained to help people with ADHD manage and organize activities and function more effectively. Follow these instructions at home: Medicines  Take over-the-counter and prescription medicines only as told by your health care provider. Talk with your health care provider  about the possible side effects of your medicines and   how to manage them. Alcohol use Do not drink alcohol if: Your health care provider tells you not to drink. You are pregnant, may be pregnant, or are planning to become pregnant. If you drink alcohol: Limit how much you use to: 0-1 drink a day for women. 0-2 drinks a day for men. Know how much alcohol is in your drink. In the [place] one drink equals one 12 oz bottle of beer (355 mL), one 5 oz glass of wine (148 mL), or one 1 oz glass of hard liquor (44 mL). Lifestyle  Do not use illegal drugs. Get enough sleep. Eat a healthy diet. Exercise regularly. Exercise can help to reduce stress and anxiety. General instructions Learn as much as you can about adult ADHD, and work closely with your health care providers to find the treatments that work best for you. Follow the same schedule each day. Use reminder devices like notes, calendars, and phone apps to stay on time and organized. Keep all follow-up visits. Your health care provider will need to monitor your condition and adjust your treatment over time. Where to find more information A health care provider may be able to recommend resources that are available online or over the phone. You could start with: Attention Deficit Disorder Association (ADDA): add.org National Institute of Mental Health (NIMH): nimh.nih.gov Contact a health care provider if: Your symptoms continue to cause problems. You have side effects from your medicine, such as: Repeated muscle twitches, coughing, or speech outbursts. Sleep problems. Loss of appetite. Dizziness. Unusually fast heartbeat. Stomach pains. Headaches. You are struggling with anxiety, depression, or substance abuse. Get help right away if: You have a severe reaction to a medicine. This symptom may be an emergency. Get help right away. Call 911. Do not wait to see if the symptom will go away. Do not drive yourself to the hospital. Take  one of these steps if you feel like you may hurt yourself or others, or have thoughts about taking your own life: Go to your nearest emergency room. Call 911. Call the National Suicide Prevention Lifeline at 1-800-273-8255 or 988. This is open 24 hours a day Text the Crisis Text Line at 741741. Summary ADHD is a mental health disorder that starts during childhood and often continues into your adult years. The exact cause of ADHD is not known. Most experts believe genetics and environmental factors contribute to ADHD. There is no cure for ADHD, but treatment with medicine, cognitive behavioral therapy, or behavioral management can help you manage your condition. This information is not intended to replace advice given to you by your health care provider. Make sure you discuss any questions you have with your health care provider. Document Revised: 12/30/2021 Document Reviewed: 12/30/2021 Elsevier Patient Education  2023 [employer] Inc.  

## 2022-10-05 ENCOUNTER — Other Ambulatory Visit: Payer: Self-pay | Admitting: Family

## 2022-10-05 DIAGNOSIS — N3289 Other specified disorders of bladder: Secondary | ICD-10-CM

## 2022-11-28 ENCOUNTER — Other Ambulatory Visit: Payer: Self-pay | Admitting: Family

## 2022-11-28 DIAGNOSIS — F5101 Primary insomnia: Secondary | ICD-10-CM

## 2022-11-28 DIAGNOSIS — Z79899 Other long term (current) drug therapy: Secondary | ICD-10-CM

## 2022-12-11 ENCOUNTER — Ambulatory Visit: Payer: No Typology Code available for payment source | Admitting: Family

## 2022-12-18 ENCOUNTER — Telehealth: Payer: Self-pay | Admitting: Family

## 2022-12-18 NOTE — Telephone Encounter (Signed)
Patient aware and verbalized understanding. °

## 2022-12-18 NOTE — Telephone Encounter (Signed)
Patient requesting to be seen by [facility] on Thursday 12/21/22 so she can get refill on her Ambien Rx. Patient was supposed to have an appt last Monday but provider was out sick so it was cancelled. Pt only has 3 pills left and works out of town. Wont be back until Thursday.   Please advise and call patient.

## 2022-12-18 NOTE — Telephone Encounter (Signed)
Mailbox full appt made

## 2022-12-21 ENCOUNTER — Encounter: Payer: Self-pay | Admitting: Family

## 2022-12-21 ENCOUNTER — Ambulatory Visit (INDEPENDENT_AMBULATORY_CARE_PROVIDER_SITE_OTHER): Payer: No Typology Code available for payment source | Admitting: Family

## 2022-12-21 VITALS — BP 125/83 | HR 65 | Temp 97.1°F | Ht 64.0 in | Wt 247.0 lb

## 2022-12-21 DIAGNOSIS — K219 Gastro-esophageal reflux disease without esophagitis: Secondary | ICD-10-CM

## 2022-12-21 DIAGNOSIS — E559 Vitamin D deficiency, unspecified: Secondary | ICD-10-CM

## 2022-12-21 DIAGNOSIS — Z0000 Encounter for general adult medical examination without abnormal findings: Secondary | ICD-10-CM

## 2022-12-21 DIAGNOSIS — Z0001 Encounter for general adult medical examination with abnormal findings: Secondary | ICD-10-CM

## 2022-12-21 DIAGNOSIS — F411 Generalized anxiety disorder: Secondary | ICD-10-CM

## 2022-12-21 DIAGNOSIS — F5101 Primary insomnia: Secondary | ICD-10-CM

## 2022-12-21 DIAGNOSIS — F908 Attention-deficit hyperactivity disorder, other type: Secondary | ICD-10-CM | POA: Diagnosis not present

## 2022-12-21 DIAGNOSIS — I10 Essential (primary) hypertension: Secondary | ICD-10-CM

## 2022-12-21 DIAGNOSIS — D509 Iron deficiency anemia, unspecified: Secondary | ICD-10-CM

## 2022-12-21 DIAGNOSIS — Z79899 Other long term (current) drug therapy: Secondary | ICD-10-CM | POA: Diagnosis not present

## 2022-12-21 DIAGNOSIS — J4520 Mild intermittent asthma, uncomplicated: Secondary | ICD-10-CM

## 2022-12-21 MED ORDER — ZOLPIDEM TARTRATE 10 MG PO TABS: 10.0000 mg | ORAL_TABLET | Freq: Every evening | ORAL | 0 refills | Status: DC | PRN

## 2022-12-21 MED ORDER — LISDEXAMFETAMINE DIMESYLATE 30 MG PO CAPS: 30.0000 mg | ORAL_CAPSULE | Freq: Every day | ORAL | 0 refills | Status: DC

## 2022-12-21 NOTE — Progress Notes (Signed)
Subjective:    Patient ID: Ashley Bradford, female    DOB: 02/18/1971, 52 y.o.   MRN: [id number]  Chief Complaint  Patient presents with   Medical Management of Chronic Issues   PT presents to the office today for CPE and chronic follow up. She has ADHD and currently taking Vyvanse 30 mg.  This is helping her stay on task on focus. Does cause dry mouth.    PT is morbid obese with HTN and GERD.  Hypertension This is a chronic problem. The current episode started more than 1 year ago. The problem has been resolved since onset. The problem is controlled. Associated symptoms include anxiety. Pertinent negatives include no malaise/fatigue, peripheral edema or shortness of breath. Risk factors for coronary artery disease include dyslipidemia and obesity. The current treatment provides moderate improvement.  Asthma There is no cough, shortness of breath or wheezing. This is a chronic problem. The current episode started more than 1 year ago. The problem occurs intermittently. Associated symptoms include heartburn. Pertinent negatives include no malaise/fatigue. She reports moderate improvement on treatment. Her past medical history is significant for asthma.  Gastroesophageal Reflux She complains of belching and heartburn. She reports no coughing or no wheezing. This is a chronic problem. The current episode started more than 1 year ago. Risk factors include obesity. She has tried a PPI for the symptoms. The treatment provided moderate relief.  Anemia Presents for follow-up visit. There has been no bruising/bleeding easily or malaise/fatigue.  Insomnia Primary symptoms: difficulty falling asleep, frequent awakening, no malaise/fatigue.   The current episode started more than one year. The onset quality is gradual. The problem occurs intermittently.  Anxiety Presents for follow-up visit. Symptoms include excessive worry, insomnia and nervous/anxious behavior. Patient reports no depressed mood or  shortness of breath. Symptoms occur occasionally. The severity of symptoms is mild.   Her past medical history is significant for anemia and asthma.      Review of Systems  Constitutional:  Negative for malaise/fatigue.  Respiratory:  Negative for cough, shortness of breath and wheezing.   Gastrointestinal:  Positive for heartburn.  Hematological:  Does not bruise/bleed easily.  Psychiatric/Behavioral:  The patient is nervous/anxious and has insomnia.   All other systems reviewed and are negative.   Family History  Problem Relation Age of Onset   Hypertension Mother    Hypertension Father    Social History   Socioeconomic History   Marital status: Single    Spouse name: Not on file   Number of children: Not on file   Years of education: Not on file   Highest education level: Not on file  Occupational History   Not on file  Tobacco Use   Smoking status: Never   Smokeless tobacco: Never  Vaping Use   Vaping Use: Never used  Substance and Sexual Activity   Alcohol use: Yes    Comment: occasionally   Drug use: No   Sexual activity: Not Currently    Birth control/protection: Surgical  Other Topics Concern   Not on file  Social History Narrative   Not on file   Social Determinants of Health   Financial Resource Strain: Not on file  Food Insecurity: Not on file  Transportation Needs: Not on file  Physical Activity: Not on file  Stress: Not on file  Social Connections: Not on file       Objective:   Physical Exam Vitals reviewed.  Constitutional:      General: She is not  in acute distress.    Appearance: She is well-developed. She is obese.  HENT:     Head: Normocephalic and atraumatic.     Right Ear: Tympanic membrane normal.     Left Ear: Tympanic membrane normal.  Eyes:     Pupils: Pupils are equal, round, and reactive to light.  Neck:     Thyroid: No thyromegaly.  Cardiovascular:     Rate and Rhythm: Normal rate and regular rhythm.     Heart  sounds: Normal heart sounds. No murmur heard. Pulmonary:     Effort: Pulmonary effort is normal. No respiratory distress.     Breath sounds: Normal breath sounds. No wheezing.  Abdominal:     General: Bowel sounds are normal. There is no distension.     Palpations: Abdomen is soft.     Tenderness: There is no abdominal tenderness.  Musculoskeletal:        General: No tenderness. Normal range of motion.     Cervical back: Normal range of motion and neck supple.  Skin:    General: Skin is warm and dry.  Neurological:     Mental Status: She is alert and oriented to person, place, and time.     Cranial Nerves: No cranial nerve deficit.     Deep Tendon Reflexes: Reflexes are normal and symmetric.  Psychiatric:        Behavior: Behavior normal.        Thought Content: Thought content normal.        Judgment: Judgment normal.       BP 125/83   Pulse 65   Temp (!) 97.1 F (36.2 C) (Temporal)   Ht 5\' 4"  (1.626 m)   Wt 247 lb (112 kg)   LMP 11/23/2017 (Approximate)   SpO2 98%   BMI 42.40 kg/m      Assessment & Plan:  Ashley Bradford comes in today with chief complaint of Medical Management of Chronic Issues   Diagnosis and orders addressed:  1. Annual physical exam - CMP14+EGFR - CBC with Differential/Platelet - Lipid panel - TSH - Hepatitis C antibody  2. ADHD, adult residual type Meds as prescribed Behavior modification as needed Follow-up for recheck in 2 months - lisdexamfetamine (VYVANSE) 30 MG capsule; Take 1 capsule (30 mg total) by mouth daily before breakfast.  Dispense: 30 capsule; Refill: 0 - lisdexamfetamine (VYVANSE) 30 MG capsule; Take 1 capsule (30 mg total) by mouth daily before breakfast.  Dispense: 30 capsule; Refill: 0 - lisdexamfetamine (VYVANSE) 30 MG capsule; Take 1 capsule (30 mg total) by mouth daily before breakfast.  Dispense: 30 capsule; Refill: 0  3. Controlled substance agreement signed  - lisdexamfetamine (VYVANSE) 30 MG capsule; Take 1  capsule (30 mg total) by mouth daily before breakfast.  Dispense: 30 capsule; Refill: 0 - lisdexamfetamine (VYVANSE) 30 MG capsule; Take 1 capsule (30 mg total) by mouth daily before breakfast.  Dispense: 30 capsule; Refill: 0 - lisdexamfetamine (VYVANSE) 30 MG capsule; Take 1 capsule (30 mg total) by mouth daily before breakfast.  Dispense: 30 capsule; Refill: 0 - zolpidem (AMBIEN) 10 MG tablet; Take 1 tablet (10 mg total) by mouth at bedtime as needed for sleep.  Dispense: 90 tablet; Refill: 0  4. Primary insomnia - zolpidem (AMBIEN) 10 MG tablet; Take 1 tablet (10 mg total) by mouth at bedtime as needed for sleep.  Dispense: 90 tablet; Refill: 0  5. Primary hypertension  6. GAD (generalized anxiety disorder)  7. Iron deficiency anemia, unspecified iron  deficiency anemia type  8. Mild intermittent asthma without complication  9. Gastroesophageal reflux disease without esophagitis  10. Morbid obesity ([place])  11. Vitamin D deficiency     Labs pending Patient reviewed in [place] controlled database, no flags noted. Contract and drug screen are up to date.  Health Maintenance reviewed Diet and exercise encouraged  Follow up plan: 3 months   Evelina Dun, FNP

## 2022-12-21 NOTE — Patient Instructions (Addendum)
Our records indicate that you are due for your screening mammogram.  Please call the imaging center that does your yearly mammograms to make an appointment for a mammogram at your earliest convenience. Our office also has a mobile unit through the [facility] that comes to our location. Please call our office if you would like to make an appointment.  Health Maintenance, Female Adopting a healthy lifestyle and getting preventive care are important in promoting health and wellness. Ask your health care provider about: The right schedule for you to have regular tests and exams. Things you can do on your own to prevent diseases and keep yourself healthy. What should I know about diet, weight, and exercise? Eat a healthy diet  Eat a diet that includes plenty of vegetables, fruits, low-fat dairy products, and lean protein. Do not eat a lot of foods that are high in solid fats, added sugars, or sodium. Maintain a healthy weight Body mass index (BMI) is used to identify weight problems. It estimates body fat based on height and weight. Your health care provider can help determine your BMI and help you achieve or maintain a healthy weight. Get regular exercise Get regular exercise. This is one of the most important things you can do for your health. Most adults should: Exercise for at least 150 minutes each week. The exercise should increase your heart rate and make you sweat (moderate-intensity exercise). Do strengthening exercises at least twice a week. This is in addition to the moderate-intensity exercise. Spend less time sitting. Even light physical activity can be beneficial. Watch cholesterol and blood lipids Have your blood tested for lipids and cholesterol at 52 years of age, then have this test every 5 years. Have your cholesterol levels checked more often if: Your lipid or cholesterol levels are high. You are older than 52 years of age. You are at high risk for heart  disease. What should I know about cancer screening? Depending on your health history and family history, you may need to have cancer screening at various ages. This may include screening for: Breast cancer. Cervical cancer. Colorectal cancer. Skin cancer. Lung cancer. What should I know about heart disease, diabetes, and high blood pressure? Blood pressure and heart disease High blood pressure causes heart disease and increases the risk of stroke. This is more likely to develop in people who have high blood pressure readings or are overweight. Have your blood pressure checked: Every 3-5 years if you are 41-23 years of age. Every year if you are 65 years old or older. Diabetes Have regular diabetes screenings. This checks your fasting blood sugar level. Have the screening done: Once every three years after age 18 if you are at a normal weight and have a low risk for diabetes. More often and at a younger age if you are overweight or have a high risk for diabetes. What should I know about preventing infection? Hepatitis B If you have a higher risk for hepatitis B, you should be screened for this virus. Talk with your health care provider to find out if you are at risk for hepatitis B infection. Hepatitis C Testing is recommended for: Everyone born from [birth year] through 1965. Anyone with known risk factors for hepatitis C. Sexually transmitted infections (STIs) Get screened for STIs, including gonorrhea and chlamydia, if: You are sexually active and are younger than 52 years of age. You are older than 52 years of age and your health care provider tells you that you  are at risk for this type of infection. Your sexual activity has changed since you were last screened, and you are at increased risk for chlamydia or gonorrhea. Ask your health care provider if you are at risk. Ask your health care provider about whether you are at high risk for HIV. Your health care provider may recommend a  prescription medicine to help prevent HIV infection. If you choose to take medicine to prevent HIV, you should first get tested for HIV. You should then be tested every 3 months for as long as you are taking the medicine. Pregnancy If you are about to stop having your period (premenopausal) and you may become pregnant, seek counseling before you get pregnant. Take 400 to 800 micrograms (mcg) of folic acid every day if you become pregnant. Ask for birth control (contraception) if you want to prevent pregnancy. Osteoporosis and menopause Osteoporosis is a disease in which the bones lose minerals and strength with aging. This can result in bone fractures. If you are 44 years old or older, or if you are at risk for osteoporosis and fractures, ask your health care provider if you should: Be screened for bone loss. Take a calcium or vitamin D supplement to lower your risk of fractures. Be given hormone replacement therapy (HRT) to treat symptoms of menopause. Follow these instructions at home: Alcohol use Do not drink alcohol if: Your health care provider tells you not to drink. You are pregnant, may be pregnant, or are planning to become pregnant. If you drink alcohol: Limit how much you have to: 0-1 drink a day. Know how much alcohol is in your drink. In the [place] one drink equals one 12 oz bottle of beer (355 mL), one 5 oz glass of wine (148 mL), or one 1 oz glass of hard liquor (44 mL). Lifestyle Do not use any products that contain nicotine or tobacco. These products include cigarettes, chewing tobacco, and vaping devices, such as e-cigarettes. If you need help quitting, ask your health care provider. Do not use street drugs. Do not share needles. Ask your health care provider for help if you need support or information about quitting drugs. General instructions Schedule regular health, dental, and eye exams. Stay current with your vaccines. Tell your health care provider if: You often  feel depressed. You have ever been abused or do not feel safe at home. Summary Adopting a healthy lifestyle and getting preventive care are important in promoting health and wellness. Follow your health care provider's instructions about healthy diet, exercising, and getting tested or screened for diseases. Follow your health care provider's instructions on monitoring your cholesterol and blood pressure. This information is not intended to replace advice given to you by your health care provider. Make sure you discuss any questions you have with your health care provider. Document Revised: 01/31/2021 Document Reviewed: 01/31/2021 Elsevier Patient Education  [place].

## 2022-12-22 LAB — CBC WITH DIFFERENTIAL/PLATELET
Basophils Absolute: 0.1 10*3/uL (ref 0.0–0.2)
Basos: 1 %
EOS (ABSOLUTE): 0.1 10*3/uL (ref 0.0–0.4)
Eos: 2 %
Hematocrit: 41.6 % (ref 34.0–46.6)
Hemoglobin: 14 g/dL (ref 11.1–15.9)
Immature Grans (Abs): 0 10*3/uL (ref 0.0–0.1)
Immature Granulocytes: 0 %
Lymphocytes Absolute: 2.2 10*3/uL (ref 0.7–3.1)
Lymphs: 31 %
MCH: 31 pg (ref 26.6–33.0)
MCHC: 33.7 g/dL (ref 31.5–35.7)
MCV: 92 fL (ref 79–97)
Monocytes Absolute: 0.5 10*3/uL (ref 0.1–0.9)
Monocytes: 7 %
Neutrophils Absolute: 4.3 10*3/uL (ref 1.4–7.0)
Neutrophils: 59 %
Platelets: 265 10*3/uL (ref 150–450)
RBC: 4.52 x10E6/uL (ref 3.77–5.28)
RDW: 12 % (ref 11.7–15.4)
WBC: 7.2 10*3/uL (ref 3.4–10.8)

## 2022-12-22 LAB — LIPID PANEL
Chol/HDL Ratio: 2.6 ratio (ref 0.0–4.4)
Cholesterol, Total: 135 mg/dL (ref 100–199)
HDL: 51 mg/dL (ref >39)
LDL Chol Calc (NIH): 69 mg/dL (ref 0–99)
Triglycerides: 77 mg/dL (ref 0–149)
VLDL Cholesterol Cal: 15 mg/dL (ref 5–40)

## 2022-12-22 LAB — CMP14+EGFR
ALT: 23 IU/L (ref 0–32)
AST: 17 IU/L (ref 0–40)
Albumin/Globulin Ratio: 1.9 (ref 1.2–2.2)
Albumin: 4.3 g/dL (ref 3.8–4.9)
Alkaline Phosphatase: 79 IU/L (ref 44–121)
BUN/Creatinine Ratio: 16 (ref 9–23)
BUN: 14 mg/dL (ref 6–24)
Bilirubin Total: 0.3 mg/dL (ref 0.0–1.2)
CO2: 26 mmol/L (ref 20–29)
Calcium: 9.7 mg/dL (ref 8.7–10.2)
Chloride: 104 mmol/L (ref 96–106)
Creatinine, Ser: 0.86 mg/dL (ref 0.57–1.00)
Globulin, Total: 2.3 g/dL (ref 1.5–4.5)
Glucose: 94 mg/dL (ref 70–99)
Potassium: 4.1 mmol/L (ref 3.5–5.2)
Sodium: 143 mmol/L (ref 134–144)
Total Protein: 6.6 g/dL (ref 6.0–8.5)
eGFR: 82 mL/min/{1.73_m2} (ref >59)

## 2022-12-22 LAB — HEPATITIS C ANTIBODY: Hep C Virus Ab: NONREACTIVE

## 2022-12-22 LAB — TSH: TSH: 0.662 u[IU]/mL (ref 0.450–4.500)

## 2023-01-05 ENCOUNTER — Other Ambulatory Visit: Payer: Self-pay | Admitting: Family

## 2023-01-05 DIAGNOSIS — N3289 Other specified disorders of bladder: Secondary | ICD-10-CM

## 2023-01-05 DIAGNOSIS — F411 Generalized anxiety disorder: Secondary | ICD-10-CM

## 2023-01-09 ENCOUNTER — Encounter: Payer: Self-pay | Admitting: Family

## 2023-03-19 ENCOUNTER — Ambulatory Visit (INDEPENDENT_AMBULATORY_CARE_PROVIDER_SITE_OTHER): Payer: No Typology Code available for payment source | Admitting: Family

## 2023-03-19 ENCOUNTER — Encounter: Payer: Self-pay | Admitting: Family

## 2023-03-19 VITALS — BP 121/75 | HR 56 | Temp 98.0°F | Ht 64.0 in | Wt 255.2 lb

## 2023-03-19 DIAGNOSIS — K219 Gastro-esophageal reflux disease without esophagitis: Secondary | ICD-10-CM

## 2023-03-19 DIAGNOSIS — I10 Essential (primary) hypertension: Secondary | ICD-10-CM

## 2023-03-19 DIAGNOSIS — F908 Attention-deficit hyperactivity disorder, other type: Secondary | ICD-10-CM

## 2023-03-19 DIAGNOSIS — Z90710 Acquired absence of both cervix and uterus: Secondary | ICD-10-CM

## 2023-03-19 DIAGNOSIS — F5101 Primary insomnia: Secondary | ICD-10-CM | POA: Diagnosis not present

## 2023-03-19 DIAGNOSIS — Z79899 Other long term (current) drug therapy: Secondary | ICD-10-CM | POA: Diagnosis not present

## 2023-03-19 DIAGNOSIS — F411 Generalized anxiety disorder: Secondary | ICD-10-CM

## 2023-03-19 DIAGNOSIS — J4520 Mild intermittent asthma, uncomplicated: Secondary | ICD-10-CM | POA: Diagnosis not present

## 2023-03-19 MED ORDER — ZOLPIDEM TARTRATE 10 MG PO TABS
10.0000 mg | ORAL_TABLET | Freq: Every evening | ORAL | 0 refills | Status: DC | PRN
Start: 2023-03-19 — End: 2023-06-11

## 2023-03-19 MED ORDER — LISDEXAMFETAMINE DIMESYLATE 40 MG PO CAPS
40.0000 mg | ORAL_CAPSULE | Freq: Every day | ORAL | 0 refills | Status: DC
Start: 2023-03-19 — End: 2023-06-11

## 2023-03-19 MED ORDER — LISDEXAMFETAMINE DIMESYLATE 40 MG PO CAPS
40.0000 mg | ORAL_CAPSULE | Freq: Every day | ORAL | 0 refills | Status: DC
Start: 2023-04-18 — End: 2023-06-11

## 2023-03-19 MED ORDER — LISDEXAMFETAMINE DIMESYLATE 40 MG PO CAPS
40.0000 mg | ORAL_CAPSULE | Freq: Every day | ORAL | 0 refills | Status: DC
Start: 2023-05-18 — End: 2023-06-11

## 2023-03-19 NOTE — Patient Instructions (Signed)
Attention Deficit Hyperactivity Disorder, Adult Attention deficit hyperactivity disorder (ADHD) is a mental health disorder that starts during childhood. For many people with ADHD, the disorder continues into the adult years. Treatment can help you manage your symptoms. There are three main types of ADHD: Inattentive. With this type, adults have difficulty paying attention. This may affect cognitive abilities. Hyperactive-impulsive. With this type, adults have a lot of energy and have difficulty controlling their behavior. Combination type. Some people may have symptoms of both types. What are the causes? The exact cause of ADHD is not known. Most experts believe a person's genes and environment possibly contribute to ADHD. What increases the risk? The following factors may make you more likely to develop this condition: Having a first-degree relative such as a parent, brother, or sister, with the condition. Being born before 37 weeks of pregnancy (prematurely) or at a low birth weight. Being born to a mother who smoked tobacco or drank alcohol during pregnancy. Having experienced a brain injury. Being exposed to lead or other toxins in the womb or early in life. What are the signs or symptoms? Symptoms of this condition depend on the type of ADHD. Symptoms of the inattentive type include: Difficulty paying attention or following instructions. Often making simple mistakes. Being disorganized. Avoiding tasks that require time and attention. Losing and forgetting things. Symptoms of the hyperactive-impulsive type include: Restlessness. Talking out of turn, interrupting others, or talking too much. Difficulty with: Sitting still. Feeling motivated. Relaxing. Waiting in line or waiting for a turn. People with the combination type have symptoms of both of the other types. In adults, this condition may lead to certain problems, such as: Keeping jobs. Performing tasks at work. Having  stable relationships. Being on time or keeping to a schedule. How is this diagnosed? This condition is diagnosed based on your current symptoms and your history of symptoms. The diagnosis can be made by a health care provider such as a primary care provider or a mental health care specialist. Your health care provider may use a symptom checklist or a behavior rating scale to evaluate your symptoms. Your health care provider may also want to talk with people who have observed your behaviors throughout your life. How is this treated? This condition can be treated with medicines and behavior therapy. Medicines may be the best option to reduce impulsive behaviors and improve attention. Your health care provider may recommend: Stimulant medicines. These are the most common medicines used for adult ADHD. They affect certain chemicals in the brain (neurotransmitters) and improve your ability to control your symptoms. A non-stimulant medicine. These medicines can also improve focus, attention, and impulsive behavior. It may take weeks to months to see the effects of this medicine. Counseling and behavioral management are also important for treating ADHD. Counseling is often used along with medicine. Your health care provider may suggest: Cognitive behavioral therapy (CBT). This type of therapy teaches you to replace negative thoughts and actions with positive thoughts and actions. When used as part of ADHD treatment, this therapy may also include: Coping strategies for organization, time management, impulse control, and stress reduction. Mindfulness and meditation training. Behavioral management. You may work with a coach who is specially trained to help people with ADHD manage and organize activities and function more effectively. Follow these instructions at home: Medicines  Take over-the-counter and prescription medicines only as told by your health care provider. Talk with your health care provider  about the possible side effects of your medicines and   how to manage them. Alcohol use Do not drink alcohol if: Your health care provider tells you not to drink. You are pregnant, may be pregnant, or are planning to become pregnant. If you drink alcohol: Limit how much you use to: 0-1 drink a day for women. 0-2 drinks a day for men. Know how much alcohol is in your drink. In the [place] one drink equals one 12 oz bottle of beer (355 mL), one 5 oz glass of wine (148 mL), or one 1 oz glass of hard liquor (44 mL). Lifestyle  Do not use illegal drugs. Get enough sleep. Eat a healthy diet. Exercise regularly. Exercise can help to reduce stress and anxiety. General instructions Learn as much as you can about adult ADHD, and work closely with your health care providers to find the treatments that work best for you. Follow the same schedule each day. Use reminder devices like notes, calendars, and phone apps to stay on time and organized. Keep all follow-up visits. Your health care provider will need to monitor your condition and adjust your treatment over time. Where to find more information A health care provider may be able to recommend resources that are available online or over the phone. You could start with: Attention Deficit Disorder Association (ADDA): add.org National Institute of Mental Health (NIMH): nimh.nih.gov Contact a health care provider if: Your symptoms continue to cause problems. You have side effects from your medicine, such as: Repeated muscle twitches, coughing, or speech outbursts. Sleep problems. Loss of appetite. Dizziness. Unusually fast heartbeat. Stomach pains. Headaches. You are struggling with anxiety, depression, or substance abuse. Get help right away if: You have a severe reaction to a medicine. This symptom may be an emergency. Get help right away. Call 911. Do not wait to see if the symptom will go away. Do not drive yourself to the hospital. Take  one of these steps if you feel like you may hurt yourself or others, or have thoughts about taking your own life: Go to your nearest emergency room. Call 911. Call the National Suicide Prevention Lifeline at 1-800-273-8255 or 988. This is open 24 hours a day Text the Crisis Text Line at 741741. Summary ADHD is a mental health disorder that starts during childhood and often continues into your adult years. The exact cause of ADHD is not known. Most experts believe genetics and environmental factors contribute to ADHD. There is no cure for ADHD, but treatment with medicine, cognitive behavioral therapy, or behavioral management can help you manage your condition. This information is not intended to replace advice given to you by your health care provider. Make sure you discuss any questions you have with your health care provider. Document Revised: 12/30/2021 Document Reviewed: 12/30/2021 Elsevier Patient Education  2024 Else[employer] Inc.  

## 2023-03-19 NOTE — Progress Notes (Signed)
Subjective:    Patient ID: Ashley Bradford, female    DOB: [birth year], 52 y.o.   MRN: [id number]  Chief Complaint  Patient presents with   Medical Management of Chronic Issues    3 month follow up    PT presents to the office today for  chronic follow up. She has ADHD and currently taking Vyvanse 30 mg.   This is helping her stay on task on focus, but states she feels like this is not working as well. She is in an office with 10 people and gets distraughted with noise.  Does cause dry mouth.    PT is morbid obese with HTN and GERD.   Pt has had hysterectomy 2018. Requesting hormones to be checked today. Denies hot flashes.   Hypertension This is a chronic problem. The current episode started more than 1 year ago. The problem has been resolved since onset. The problem is controlled. Associated symptoms include anxiety. Pertinent negatives include no malaise/fatigue, peripheral edema or shortness of breath. Risk factors for coronary artery disease include obesity and sedentary lifestyle. The current treatment provides moderate improvement.  Asthma There is no cough, shortness of breath or wheezing. This is a chronic problem. The current episode started more than 1 year ago. The problem occurs intermittently. Associated symptoms include heartburn. Pertinent negatives include no malaise/fatigue. She reports moderate improvement on treatment. Her past medical history is significant for asthma.  Gastroesophageal Reflux She complains of belching and heartburn. She reports no coughing or no wheezing. This is a chronic problem. The current episode started more than 1 year ago. The problem occurs occasionally. The symptoms are aggravated by certain foods. She has tried a PPI for the symptoms. The treatment provided moderate relief.  Anemia Presents for follow-up visit. There has been no malaise/fatigue.  Insomnia Primary symptoms: difficulty falling asleep, frequent awakening, no malaise/fatigue.   The  current episode started more than one year. The onset quality is gradual. The problem occurs intermittently. Past treatments include medication. The treatment provided moderate relief.  Anxiety Presents for follow-up visit. Symptoms include excessive worry, insomnia, nervous/anxious behavior and restlessness. Patient reports no shortness of breath. Symptoms occur most days. The severity of symptoms is moderate.   Her past medical history is significant for anemia and asthma.      Review of Systems  Constitutional:  Negative for malaise/fatigue.  Respiratory:  Negative for cough, shortness of breath and wheezing.   Gastrointestinal:  Positive for heartburn.  Psychiatric/Behavioral:  The patient is nervous/anxious and has insomnia.   All other systems reviewed and are negative.      Objective:   Physical Exam Vitals reviewed.  Constitutional:      General: She is not in acute distress.    Appearance: She is well-developed. She is obese.  HENT:     Head: Normocephalic and atraumatic.     Right Ear: Tympanic membrane normal.     Left Ear: Tympanic membrane normal.  Eyes:     Pupils: Pupils are equal, round, and reactive to light.  Neck:     Thyroid: No thyromegaly.  Cardiovascular:     Rate and Rhythm: Normal rate and regular rhythm.     Heart sounds: Normal heart sounds. No murmur heard. Pulmonary:     Effort: Pulmonary effort is normal. No respiratory distress.     Breath sounds: Normal breath sounds. No wheezing.  Abdominal:     General: Bowel sounds are normal. There is no distension.  Palpations: Abdomen is soft.     Tenderness: There is no abdominal tenderness.  Musculoskeletal:        General: No tenderness. Normal range of motion.     Cervical back: Normal range of motion and neck supple.  Skin:    General: Skin is warm and dry.  Neurological:     Mental Status: She is alert and oriented to person, place, and time.     Cranial Nerves: No cranial nerve deficit.      Deep Tendon Reflexes: Reflexes are normal and symmetric.  Psychiatric:        Behavior: Behavior normal.        Thought Content: Thought content normal.        Judgment: Judgment normal.     BP 121/75   Pulse (!) 56   Temp 98 F (36.7 C) (Temporal)   Ht 5\' 4"  (1.626 m)   Wt 255 lb 3.2 oz (115.8 kg)   LMP 11/23/2017 (Approximate)   SpO2 97%   BMI 43.80 kg/m      Assessment & Plan:  Ashley Bradford comes in today with chief complaint of Medical Management of Chronic Issues (3 month follow up )   Diagnosis and orders addressed:  1. ADHD, adult residual type Meds as prescribed Behavior modification as needed Follow-up for recheck in 3 months - lisdexamfetamine (VYVANSE) 40 MG capsule; Take 1 capsule (40 mg total) by mouth daily before breakfast.  Dispense: 30 capsule; Refill: 0 - lisdexamfetamine (VYVANSE) 40 MG capsule; Take 1 capsule (40 mg total) by mouth daily before breakfast.  Dispense: 30 capsule; Refill: 0 - lisdexamfetamine (VYVANSE) 40 MG capsule; Take 1 capsule (40 mg total) by mouth daily before breakfast.  Dispense: 30 capsule; Refill: 0  2. Controlled substance agreement signed - zolpidem (AMBIEN) 10 MG tablet; Take 1 tablet (10 mg total) by mouth at bedtime as needed for sleep.  Dispense: 90 tablet; Refill: 0  3. Primary insomnia - zolpidem (AMBIEN) 10 MG tablet; Take 1 tablet (10 mg total) by mouth at bedtime as needed for sleep.  Dispense: 90 tablet; Refill: 0  4. Mild intermittent asthma without complication  5. GAD (generalized anxiety disorder) - [facility]/LH  6. Gastroesophageal reflux disease without esophagitis  7. Primary hypertension  8. Morbid obesity (HCC) - FSH/LH  9. H/O: hysterectomy - FSH/LH   Labs pending Patient reviewed in [place] controlled database, no flags noted. Contract and drug screen are up to date.  Health Maintenance reviewed Diet and exercise encouraged  Follow up plan: 3 months    Jannifer Rodney, FNP

## 2023-03-20 LAB — FSH/LH
FSH: 38.5 m[IU]/mL
LH: 42.8 m[IU]/mL

## 2023-03-21 ENCOUNTER — Other Ambulatory Visit: Payer: Self-pay | Admitting: Family

## 2023-03-21 DIAGNOSIS — J4520 Mild intermittent asthma, uncomplicated: Secondary | ICD-10-CM

## 2023-03-21 DIAGNOSIS — N3289 Other specified disorders of bladder: Secondary | ICD-10-CM

## 2023-04-26 ENCOUNTER — Other Ambulatory Visit: Payer: Self-pay | Admitting: Family

## 2023-05-24 ENCOUNTER — Encounter (INDEPENDENT_AMBULATORY_CARE_PROVIDER_SITE_OTHER): Payer: No Typology Code available for payment source

## 2023-05-24 ENCOUNTER — Other Ambulatory Visit: Payer: Self-pay | Admitting: Family

## 2023-05-24 ENCOUNTER — Other Ambulatory Visit: Payer: Self-pay

## 2023-05-24 DIAGNOSIS — T753XXA Motion sickness, initial encounter: Secondary | ICD-10-CM

## 2023-05-24 MED ORDER — MECLIZINE HCL 25 MG PO TABS
25.0000 mg | ORAL_TABLET | Freq: Three times a day (TID) | ORAL | 0 refills | Status: DC | PRN
Start: 2023-05-24 — End: 2023-06-11

## 2023-05-24 MED ORDER — EPINEPHRINE 0.3 MG/0.3ML IJ SOAJ: 0.3000 mg | Freq: Once | INTRAMUSCULAR | 0 refills | Status: DC | PRN

## 2023-05-24 MED ORDER — SCOPOLAMINE 1 MG/3DAYS TD PT72
1.0000 | MEDICATED_PATCH | TRANSDERMAL | 12 refills | Status: DC
Start: 2023-05-24 — End: 2023-05-24

## 2023-05-24 MED ORDER — SCOPOLAMINE 1 MG/3DAYS TD PT72
1.0000 | MEDICATED_PATCH | TRANSDERMAL | 12 refills | Status: DC
Start: 2023-05-24 — End: 2023-06-11

## 2023-05-24 NOTE — Telephone Encounter (Signed)
Hello, I have sent in scopolamine patches and Antivert as needed for motion sickness. Let me know if need anything else.   Jannifer Rodney, FNP  Approximately 5 minutes was spent documenting and reviewing patient's chart.

## 2023-06-11 ENCOUNTER — Encounter: Payer: Self-pay | Admitting: Family

## 2023-06-11 ENCOUNTER — Ambulatory Visit (INDEPENDENT_AMBULATORY_CARE_PROVIDER_SITE_OTHER): Payer: No Typology Code available for payment source | Admitting: Family

## 2023-06-11 VITALS — BP 111/71 | HR 61 | Temp 97.9°F | Ht 64.0 in | Wt 247.2 lb

## 2023-06-11 DIAGNOSIS — F908 Attention-deficit hyperactivity disorder, other type: Secondary | ICD-10-CM | POA: Diagnosis not present

## 2023-06-11 DIAGNOSIS — L259 Unspecified contact dermatitis, unspecified cause: Secondary | ICD-10-CM | POA: Diagnosis not present

## 2023-06-11 DIAGNOSIS — Z79899 Other long term (current) drug therapy: Secondary | ICD-10-CM

## 2023-06-11 DIAGNOSIS — F411 Generalized anxiety disorder: Secondary | ICD-10-CM

## 2023-06-11 DIAGNOSIS — J4520 Mild intermittent asthma, uncomplicated: Secondary | ICD-10-CM

## 2023-06-11 DIAGNOSIS — I10 Essential (primary) hypertension: Secondary | ICD-10-CM

## 2023-06-11 DIAGNOSIS — F5101 Primary insomnia: Secondary | ICD-10-CM

## 2023-06-11 DIAGNOSIS — K219 Gastro-esophageal reflux disease without esophagitis: Secondary | ICD-10-CM

## 2023-06-11 MED ORDER — NEBIVOLOL HCL 10 MG PO TABS
10.0000 mg | ORAL_TABLET | Freq: Every day | ORAL | 3 refills | Status: DC
Start: 2023-06-11 — End: 2023-10-01

## 2023-06-11 MED ORDER — LISDEXAMFETAMINE DIMESYLATE 40 MG PO CAPS: 40.0000 mg | ORAL_CAPSULE | Freq: Every day | ORAL | 0 refills | Status: DC

## 2023-06-11 MED ORDER — TRIAMCINOLONE ACETONIDE 0.5 % EX OINT
1.0000 | TOPICAL_OINTMENT | Freq: Two times a day (BID) | CUTANEOUS | 1 refills | Status: DC
Start: 2023-06-11 — End: 2024-02-11

## 2023-06-11 MED ORDER — ZOLPIDEM TARTRATE 10 MG PO TABS
10.0000 mg | ORAL_TABLET | Freq: Every evening | ORAL | 0 refills | Status: AC | PRN
Start: 2023-06-11 — End: ?

## 2023-06-11 MED ORDER — LISDEXAMFETAMINE DIMESYLATE 40 MG PO CAPS
40.0000 mg | ORAL_CAPSULE | Freq: Every day | ORAL | 0 refills | Status: DC
Start: 2023-06-11 — End: 2023-07-17

## 2023-06-11 MED ORDER — OMEPRAZOLE 20 MG PO CPDR
20.0000 mg | DELAYED_RELEASE_CAPSULE | Freq: Every day | ORAL | 3 refills | Status: DC
Start: 2023-06-11 — End: 2023-07-13

## 2023-06-11 NOTE — Patient Instructions (Signed)

## 2023-06-11 NOTE — Progress Notes (Signed)
Subjective:    Patient ID: Ashley Bradford, female    DOB: [birth year], 52 y.o.   MRN: [id number]  Chief Complaint  Patient presents with   Medical Management of Chronic Issues   PT presents to the office today for  chronic follow up. She has ADHD and currently taking Vyvanse 40 mg.   This is helping her stay on task on focus.    PT is morbid obese with HTN and GERD.   Hypertension This is a chronic problem. The current episode started more than 1 year ago. The problem has been resolved since onset. The problem is controlled. Associated symptoms include anxiety. Pertinent negatives include no malaise/fatigue, peripheral edema or shortness of breath. Risk factors for coronary artery disease include dyslipidemia, obesity and sedentary lifestyle. The current treatment provides moderate improvement.  Asthma There is no cough, shortness of breath or wheezing. This is a chronic problem. The current episode started more than 1 year ago. The problem occurs intermittently. Associated symptoms include heartburn. Pertinent negatives include no malaise/fatigue. She reports moderate improvement on treatment. Her past medical history is significant for asthma.  Gastroesophageal Reflux She complains of belching and heartburn. She reports no coughing or no wheezing. This is a chronic problem. The current episode started more than 1 year ago. The problem occurs occasionally. Risk factors include obesity. She has tried a PPI for the symptoms. The treatment provided moderate relief.  Insomnia Primary symptoms: difficulty falling asleep, frequent awakening, no malaise/fatigue.   The current episode started more than one year. The problem occurs intermittently. Past treatments include medication. The treatment provided moderate relief.  Anxiety Presents for follow-up visit. Symptoms include dry mouth, excessive worry, insomnia and nervous/anxious behavior. Patient reports no shortness of breath. Symptoms occur  occasionally. The severity of symptoms is mild.   Her past medical history is significant for asthma.      Review of Systems  Constitutional:  Negative for malaise/fatigue.  Respiratory:  Negative for cough, shortness of breath and wheezing.   Gastrointestinal:  Positive for heartburn.  Psychiatric/Behavioral:  The patient is nervous/anxious and has insomnia.   All other systems reviewed and are negative.      Objective:   Physical Exam Vitals reviewed.  Constitutional:      General: She is not in acute distress.    Appearance: She is well-developed. She is obese.  HENT:     Head: Normocephalic and atraumatic.     Right Ear: Tympanic membrane normal.     Left Ear: Tympanic membrane normal.  Eyes:     Pupils: Pupils are equal, round, and reactive to light.  Neck:     Thyroid: No thyromegaly.  Cardiovascular:     Rate and Rhythm: Normal rate and regular rhythm.     Heart sounds: Normal heart sounds. No murmur heard. Pulmonary:     Effort: Pulmonary effort is normal. No respiratory distress.     Breath sounds: Normal breath sounds. No wheezing.  Abdominal:     General: Bowel sounds are normal. There is no distension.     Palpations: Abdomen is soft.     Tenderness: There is no abdominal tenderness.  Musculoskeletal:        General: No tenderness. Normal range of motion.     Cervical back: Normal range of motion and neck supple.  Skin:    General: Skin is warm and dry.  Neurological:     Mental Status: She is alert and oriented to person, place, and time.  Cranial Nerves: No cranial nerve deficit.     Deep Tendon Reflexes: Reflexes are normal and symmetric.  Psychiatric:        Behavior: Behavior normal.        Thought Content: Thought content normal.        Judgment: Judgment normal.       BP 111/71   Pulse 61   Temp 97.9 F (36.6 C) (Temporal)   Ht 5\' 4"  (1.626 m)   Wt 247 lb 3.2 oz (112.1 kg)   LMP 11/23/2017 (Approximate)   SpO2 96%   BMI 42.43  kg/m      Assessment & Plan:  SABRINIA RUMMEL comes in today with chief complaint of Medical Management of Chronic Issues   Diagnosis and orders addressed:  1. ADHD, adult residual type Meds as prescribed Behavior modification as needed Follow-up for recheck in 3 months - lisdexamfetamine (VYVANSE) 40 MG capsule; Take 1 capsule (40 mg total) by mouth daily before breakfast.  Dispense: 30 capsule; Refill: 0 - lisdexamfetamine (VYVANSE) 40 MG capsule; Take 1 capsule (40 mg total) by mouth daily before breakfast.  Dispense: 30 capsule; Refill: 0 - lisdexamfetamine (VYVANSE) 40 MG capsule; Take 1 capsule (40 mg total) by mouth daily before breakfast.  Dispense: 30 capsule; Refill: 0 - ToxASSURE Select 13 (MW), Urine  2. Primary hypertension - nebivolol (BYSTOLIC) 10 MG tablet; Take 1 tablet (10 mg total) by mouth daily.  Dispense: 90 tablet; Refill: 3  3. Contact dermatitis, unspecified contact dermatitis type, unspecified trigger - triamcinolone ointment (KENALOG) 0.5 %; Apply 1 Application topically 2 (two) times daily.  Dispense: 60 g; Refill: 1 - ToxASSURE Select 13 (MW), Urine  4. Controlled substance agreement signed - zolpidem (AMBIEN) 10 MG tablet; Take 1 tablet (10 mg total) by mouth at bedtime as needed for sleep.  Dispense: 90 tablet; Refill: 0  5. Primary insomnia - zolpidem (AMBIEN) 10 MG tablet; Take 1 tablet (10 mg total) by mouth at bedtime as needed for sleep.  Dispense: 90 tablet; Refill: 0  6. Mild intermittent asthma without complication  7. GAD (generalized anxiety disorder)  8. Gastroesophageal reflux disease without esophagitis - omeprazole (PRILOSEC) 20 MG capsule; Take 1 capsule (20 mg total) by mouth daily.  Dispense: 90 capsule; Refill: 3  9. Morbid obesity (HCC)    Patient reviewed in [place] controlled database, no flags noted. Contract and drug screen are up to date.  Health Maintenance reviewed Diet and exercise encouraged  Follow up plan: 3 months     Jannifer Rodney, FNP

## 2023-06-13 ENCOUNTER — Encounter: Payer: Self-pay | Admitting: Family Medicine

## 2023-06-21 DIAGNOSIS — Z79899 Other long term (current) drug therapy: Secondary | ICD-10-CM

## 2023-06-21 DIAGNOSIS — F5101 Primary insomnia: Secondary | ICD-10-CM

## 2023-06-22 MED ORDER — ZOLPIDEM TARTRATE 10 MG PO TABS: 10.0000 mg | ORAL_TABLET | Freq: Every evening | ORAL | 0 refills | Status: DC | PRN

## 2023-06-29 ENCOUNTER — Other Ambulatory Visit: Payer: Self-pay | Admitting: Family

## 2023-06-29 DIAGNOSIS — Z1211 Encounter for screening for malignant neoplasm of colon: Secondary | ICD-10-CM

## 2023-06-29 DIAGNOSIS — Z1212 Encounter for screening for malignant neoplasm of rectum: Secondary | ICD-10-CM

## 2023-07-13 ENCOUNTER — Other Ambulatory Visit: Payer: Self-pay | Admitting: Family

## 2023-07-13 DIAGNOSIS — K219 Gastro-esophageal reflux disease without esophagitis: Secondary | ICD-10-CM

## 2023-07-13 DIAGNOSIS — F908 Attention-deficit hyperactivity disorder, other type: Secondary | ICD-10-CM

## 2023-07-13 DIAGNOSIS — F411 Generalized anxiety disorder: Secondary | ICD-10-CM

## 2023-07-13 DIAGNOSIS — N3289 Other specified disorders of bladder: Secondary | ICD-10-CM

## 2023-07-13 MED ORDER — BUSPIRONE HCL 5 MG PO TABS: 5.0000 mg | ORAL_TABLET | Freq: Three times a day (TID) | ORAL | 1 refills | Status: DC

## 2023-07-13 MED ORDER — OMEPRAZOLE 20 MG PO CPDR: 20.0000 mg | DELAYED_RELEASE_CAPSULE | Freq: Every day | ORAL | 3 refills | Status: DC

## 2023-07-13 MED ORDER — TOLTERODINE TARTRATE 2 MG PO TABS: 2.0000 mg | ORAL_TABLET | Freq: Two times a day (BID) | ORAL | 1 refills | Status: DC

## 2023-07-13 NOTE — Telephone Encounter (Signed)
  Prescription Request  07/13/2023  Is this a "Controlled Substance" medicine? NO  Have you seen your PCP in the last 2 weeks? No  If YES, route message to pool  -  If NO, patient needs to be scheduled for appointment.  What is the name of the medication or equipment? Buspirone 5 mg, Tolterodine 2mg , Cetirizine 10 mg and Vyvanse Patient is going on Pill Pack and needs refills  Have you contacted your pharmacy to request a refill? YES   Which pharmacy would you like this sent to? [facility]   Patient notified that their request is being sent to the clinical staff for review and that they should receive a response within 2 business days.

## 2023-07-13 NOTE — Telephone Encounter (Signed)
Meds other than vyvanse patient aware she will have to wait for [facility] Monday to send,

## 2023-07-16 ENCOUNTER — Telehealth: Payer: Self-pay | Admitting: Family

## 2023-07-16 DIAGNOSIS — F908 Attention-deficit hyperactivity disorder, other type: Secondary | ICD-10-CM

## 2023-07-16 MED ORDER — LISDEXAMFETAMINE DIMESYLATE 40 MG PO CAPS: 40.0000 mg | ORAL_CAPSULE | Freq: Every day | ORAL | 0 refills | Status: DC

## 2023-07-17 MED ORDER — LISDEXAMFETAMINE DIMESYLATE 40 MG PO CAPS
40.0000 mg | ORAL_CAPSULE | Freq: Every day | ORAL | 0 refills | Status: DC
Start: 2023-08-08 — End: 2023-09-11

## 2023-07-17 MED ORDER — LISDEXAMFETAMINE DIMESYLATE 40 MG PO CAPS
40.0000 mg | ORAL_CAPSULE | Freq: Every day | ORAL | 0 refills | Status: DC
Start: 2023-07-17 — End: 2023-09-11

## 2023-07-17 NOTE — Telephone Encounter (Signed)
Prescription sent to pharmacy.

## 2023-08-22 ENCOUNTER — Other Ambulatory Visit: Payer: Self-pay | Admitting: Family

## 2023-08-22 DIAGNOSIS — F411 Generalized anxiety disorder: Secondary | ICD-10-CM

## 2023-08-22 DIAGNOSIS — N3289 Other specified disorders of bladder: Secondary | ICD-10-CM

## 2023-09-10 ENCOUNTER — Ambulatory Visit: Payer: No Typology Code available for payment source | Admitting: Family

## 2023-09-11 ENCOUNTER — Ambulatory Visit: Payer: Commercial Managed Care - PPO | Admitting: Family

## 2023-09-11 ENCOUNTER — Encounter: Payer: Self-pay | Admitting: Family

## 2023-09-11 VITALS — BP 120/82 | HR 59 | Temp 97.9°F | Ht 64.0 in | Wt 254.4 lb

## 2023-09-11 DIAGNOSIS — F411 Generalized anxiety disorder: Secondary | ICD-10-CM

## 2023-09-11 DIAGNOSIS — F5101 Primary insomnia: Secondary | ICD-10-CM

## 2023-09-11 DIAGNOSIS — Z79899 Other long term (current) drug therapy: Secondary | ICD-10-CM | POA: Diagnosis not present

## 2023-09-11 DIAGNOSIS — F908 Attention-deficit hyperactivity disorder, other type: Secondary | ICD-10-CM | POA: Diagnosis not present

## 2023-09-11 DIAGNOSIS — I10 Essential (primary) hypertension: Secondary | ICD-10-CM

## 2023-09-11 DIAGNOSIS — K219 Gastro-esophageal reflux disease without esophagitis: Secondary | ICD-10-CM

## 2023-09-11 DIAGNOSIS — J4520 Mild intermittent asthma, uncomplicated: Secondary | ICD-10-CM

## 2023-09-11 DIAGNOSIS — B354 Tinea corporis: Secondary | ICD-10-CM

## 2023-09-11 MED ORDER — PANTOPRAZOLE SODIUM 20 MG PO TBEC: 20.0000 mg | DELAYED_RELEASE_TABLET | Freq: Every day | ORAL | 1 refills | Status: DC

## 2023-09-11 MED ORDER — ZOLPIDEM TARTRATE 10 MG PO TABS: 10.0000 mg | ORAL_TABLET | Freq: Every evening | ORAL | 0 refills | Status: DC | PRN

## 2023-09-11 MED ORDER — LISDEXAMFETAMINE DIMESYLATE 40 MG PO CAPS: 40.0000 mg | ORAL_CAPSULE | Freq: Every day | ORAL | 0 refills | Status: DC

## 2023-09-11 MED ORDER — KETOCONAZOLE 2 % EX CREA: 1.0000 | TOPICAL_CREAM | Freq: Every day | CUTANEOUS | 1 refills | Status: AC

## 2023-09-11 NOTE — Patient Instructions (Signed)

## 2023-09-11 NOTE — Progress Notes (Signed)
Subjective:    Patient ID: Ashley Bradford, female    DOB: 1971-09-20, 52 y.o.   MRN: [id number]  Chief Complaint  Patient presents with   Medical Management of Chronic Issues   PT presents to the office today for  chronic follow up. She has ADHD and currently taking Vyvanse 40 mg.   This is helping her stay on task on focus.    PT is morbid obese with HTN and GERD.  Hypertension This is a chronic problem. The current episode started more than 1 year ago. The problem has been resolved since onset. The problem is controlled. Associated symptoms include anxiety. Pertinent negatives include no malaise/fatigue, peripheral edema or shortness of breath. Risk factors for coronary artery disease include dyslipidemia, obesity and sedentary lifestyle. The current treatment provides moderate improvement. There is no history of heart failure.  Asthma She complains of hoarse voice. There is no cough, shortness of breath or wheezing. This is a chronic problem. The current episode started more than 1 year ago. The problem occurs intermittently. Associated symptoms include heartburn. Pertinent negatives include no malaise/fatigue. Her past medical history is significant for asthma.  Gastroesophageal Reflux She complains of belching, heartburn and a hoarse voice. She reports no coughing or no wheezing. This is a chronic problem. The current episode started more than 1 year ago. The problem occurs occasionally. She has tried a PPI for the symptoms. The treatment provided moderate relief.  Insomnia Primary symptoms: difficulty falling asleep, frequent awakening, no malaise/fatigue.   The current episode started more than one year. The problem occurs intermittently. Past treatments include medication. The treatment provided mild relief.  Anxiety Presents for follow-up visit. Symptoms include excessive worry, insomnia, nervous/anxious behavior and restlessness. Patient reports no shortness of breath. Symptoms occur  occasionally. The severity of symptoms is mild.   Her past medical history is significant for asthma.  Rash This is a new problem. The current episode started 1 to 4 weeks ago. The affected locations include the left foot. The rash is characterized by redness. Pertinent negatives include no cough or shortness of breath. Her past medical history is significant for asthma.      Review of Systems  Constitutional:  Negative for malaise/fatigue.  HENT:  Positive for hoarse voice.   Respiratory:  Negative for cough, shortness of breath and wheezing.   Gastrointestinal:  Positive for heartburn.  Skin:  Positive for rash.  Psychiatric/Behavioral:  The patient is nervous/anxious and has insomnia.   All other systems reviewed and are negative.      Objective:   Physical Exam Vitals reviewed.  Constitutional:      General: She is not in acute distress.    Appearance: She is well-developed. She is obese.  HENT:     Head: Normocephalic and atraumatic.     Right Ear: Tympanic membrane normal.     Left Ear: Tympanic membrane normal.  Eyes:     Pupils: Pupils are equal, round, and reactive to light.  Neck:     Thyroid: No thyromegaly.  Cardiovascular:     Rate and Rhythm: Normal rate and regular rhythm.     Heart sounds: Normal heart sounds. No murmur heard. Pulmonary:     Effort: Pulmonary effort is normal. No respiratory distress.     Breath sounds: Normal breath sounds. No wheezing.  Abdominal:     General: Bowel sounds are normal. There is no distension.     Palpations: Abdomen is soft.     Tenderness:  There is no abdominal tenderness.  Musculoskeletal:        General: No tenderness. Normal range of motion.     Cervical back: Normal range of motion and neck supple.  Skin:    General: Skin is warm and dry.     Findings: Rash present.       Neurological:     Mental Status: She is alert and oriented to person, place, and time.     Cranial Nerves: No cranial nerve deficit.      Deep Tendon Reflexes: Reflexes are normal and symmetric.  Psychiatric:        Behavior: Behavior normal.        Thought Content: Thought content normal.        Judgment: Judgment normal.       BP 120/82   Pulse (!) 59   Temp 97.9 F (36.6 C) (Temporal)   Ht 5\' 4"  (1.626 m)   Wt 254 lb 6.4 oz (115.4 kg)   LMP 11/23/2017 (Approximate)   SpO2 100%   BMI 43.67 kg/m      Assessment & Plan:  Ashley Bradford comes in today with chief complaint of Medical Management of Chronic Issues   Diagnosis and orders addressed:  1. ADHD, adult residual type (Primary) Meds as prescribed Behavior modification as needed Follow-up for recheck in 3 months - lisdexamfetamine (VYVANSE) 40 MG capsule; Take 1 capsule (40 mg total) by mouth daily before breakfast.  Dispense: 30 capsule; Refill: 0 - lisdexamfetamine (VYVANSE) 40 MG capsule; Take 1 capsule (40 mg total) by mouth daily before breakfast.  Dispense: 30 capsule; Refill: 0 - lisdexamfetamine (VYVANSE) 40 MG capsule; Take 1 capsule (40 mg total) by mouth daily before breakfast.  Dispense: 30 capsule; Refill: 0  2. Controlled substance agreement signed - zolpidem (AMBIEN) 10 MG tablet; Take 1 tablet (10 mg total) by mouth at bedtime as needed for sleep.  Dispense: 90 tablet; Refill: 0  3. Primary insomnia - zolpidem (AMBIEN) 10 MG tablet; Take 1 tablet (10 mg total) by mouth at bedtime as needed for sleep.  Dispense: 90 tablet; Refill: 0  4. Mild intermittent asthma without complication  5. GAD (generalized anxiety disorder)  6. Gastroesophageal reflux disease without esophagitis Stop omeprazole 20 mg and start protonix 20 mg  -Diet discussed- Avoid fried, spicy, citrus foods, caffeine and alcohol -Do not eat 2-3 hours before bedtime -Encouraged small frequent meals -Avoid NSAID's - pantoprazole (PROTONIX) 20 MG tablet; Take 1 tablet (20 mg total) by mouth daily.  Dispense: 90 tablet; Refill: 1  7. Primary hypertension  8. Morbid  obesity (HCC)  9. Tinea corporis Start using cream  - ketoconazole (NIZORAL) 2 % cream; Apply 1 Application topically daily.  Dispense: 30 g; Refill: 1   Labs pending Patient reviewed in [place] controlled database, no flags noted. Contract and drug screen are up to date.  Continue current medications  Health Maintenance reviewed Diet and exercise encouraged  Follow up plan: 3 months    Jannifer Rodney, FNP

## 2023-09-28 ENCOUNTER — Other Ambulatory Visit: Payer: Self-pay | Admitting: Family

## 2023-09-28 DIAGNOSIS — N3289 Other specified disorders of bladder: Secondary | ICD-10-CM

## 2023-09-28 DIAGNOSIS — F411 Generalized anxiety disorder: Secondary | ICD-10-CM

## 2023-09-28 DIAGNOSIS — I10 Essential (primary) hypertension: Secondary | ICD-10-CM

## 2023-10-01 ENCOUNTER — Other Ambulatory Visit: Payer: Self-pay | Admitting: Family

## 2023-10-01 ENCOUNTER — Telehealth: Payer: Self-pay | Admitting: Family Medicine

## 2023-10-01 DIAGNOSIS — I10 Essential (primary) hypertension: Secondary | ICD-10-CM

## 2023-10-01 MED ORDER — NEBIVOLOL HCL 10 MG PO TABS: 10.0000 mg | ORAL_TABLET | Freq: Every day | ORAL | 3 refills | Status: DC

## 2023-10-01 NOTE — Telephone Encounter (Signed)
 Copied from CRM [id number]. Topic: Clinical - Medication Refill >> Oct 01, 2023 12:37 PM Powell HERO wrote: Most Recent Primary Care Visit:  Provider: LAVELL LYE A  Department: [facility]  Visit Type: ACUTE  Date: 09/11/2023  Medication: nebivolol  (BYSTOLIC ) 10 MG tablet  Has the patient contacted their pharmacy? Yes Pharmacy calling  Is this the correct pharmacy for this prescription? Yes If no, delete pharmacy and type the correct one.  This is the patient's preferred pharmacy:   [facility] - [place], [place] - [address] [place] [address] Phone: [phone] Fax: [phone]   Has the prescription been filled recently? Yes  Is the patient out of the medication? No, will be Wednesday 1/8  Has the patient been seen for an appointment in the last year OR does the patient have an upcoming appointment? Yes  Can we respond through MyChart? No, Phone  Agent: Please be advised that Rx refills may take up to 3 business days. We ask that you follow-up with your pharmacy.

## 2023-10-01 NOTE — Telephone Encounter (Signed)
 Meed refill sent to provider

## 2023-10-25 ENCOUNTER — Other Ambulatory Visit: Payer: Self-pay | Admitting: Family

## 2023-10-25 DIAGNOSIS — J4520 Mild intermittent asthma, uncomplicated: Secondary | ICD-10-CM

## 2023-12-10 ENCOUNTER — Ambulatory Visit: Payer: Commercial Managed Care - PPO | Admitting: Family

## 2023-12-25 ENCOUNTER — Other Ambulatory Visit: Payer: Self-pay | Admitting: Family

## 2023-12-25 DIAGNOSIS — F5101 Primary insomnia: Secondary | ICD-10-CM

## 2023-12-25 DIAGNOSIS — Z79899 Other long term (current) drug therapy: Secondary | ICD-10-CM

## 2023-12-31 ENCOUNTER — Encounter: Payer: Self-pay | Admitting: Family

## 2023-12-31 ENCOUNTER — Ambulatory Visit: Admitting: Family

## 2023-12-31 VITALS — BP 127/84 | HR 67 | Temp 98.0°F | Ht 64.0 in | Wt 257.0 lb

## 2023-12-31 DIAGNOSIS — E559 Vitamin D deficiency, unspecified: Secondary | ICD-10-CM

## 2023-12-31 DIAGNOSIS — I10 Essential (primary) hypertension: Secondary | ICD-10-CM | POA: Diagnosis not present

## 2023-12-31 DIAGNOSIS — F908 Attention-deficit hyperactivity disorder, other type: Secondary | ICD-10-CM | POA: Diagnosis not present

## 2023-12-31 DIAGNOSIS — Z1211 Encounter for screening for malignant neoplasm of colon: Secondary | ICD-10-CM

## 2023-12-31 DIAGNOSIS — Z0001 Encounter for general adult medical examination with abnormal findings: Secondary | ICD-10-CM

## 2023-12-31 DIAGNOSIS — K219 Gastro-esophageal reflux disease without esophagitis: Secondary | ICD-10-CM | POA: Diagnosis not present

## 2023-12-31 DIAGNOSIS — F5101 Primary insomnia: Secondary | ICD-10-CM

## 2023-12-31 DIAGNOSIS — F411 Generalized anxiety disorder: Secondary | ICD-10-CM

## 2023-12-31 DIAGNOSIS — J4520 Mild intermittent asthma, uncomplicated: Secondary | ICD-10-CM

## 2023-12-31 DIAGNOSIS — Z0000 Encounter for general adult medical examination without abnormal findings: Secondary | ICD-10-CM

## 2023-12-31 DIAGNOSIS — R400 Somnolence: Secondary | ICD-10-CM

## 2023-12-31 DIAGNOSIS — Z79899 Other long term (current) drug therapy: Secondary | ICD-10-CM

## 2023-12-31 LAB — LIPID PANEL

## 2023-12-31 MED ORDER — LISDEXAMFETAMINE DIMESYLATE 40 MG PO CAPS: 40.0000 mg | ORAL_CAPSULE | Freq: Every day | ORAL | 0 refills | Status: DC

## 2023-12-31 MED ORDER — ZOLPIDEM TARTRATE 10 MG PO TABS: 10.0000 mg | ORAL_TABLET | Freq: Every evening | ORAL | 0 refills | Status: DC | PRN

## 2023-12-31 MED ORDER — TROSPIUM CHLORIDE 20 MG PO TABS: 20.0000 mg | ORAL_TABLET | Freq: Two times a day (BID) | ORAL | 1 refills | Status: DC

## 2023-12-31 NOTE — Patient Instructions (Signed)
 Health Maintenance After Age 53 After age 4, you are at a higher risk for certain long-term diseases and infections as well as injuries from falls. Falls are a major cause of broken bones and head injuries in people who are older than age 47. Getting regular preventive care can help to keep you healthy and well. Preventive care includes getting regular testing and making lifestyle changes as recommended by your health care provider. Talk with your health care provider about: Which screenings and tests you should have. A screening is a test that checks for a disease when you have no symptoms. A diet and exercise plan that is right for you. What should I know about screenings and tests to prevent falls? Screening and testing are the best ways to find a health problem early. Early diagnosis and treatment give you the best chance of managing medical conditions that are common after age 37. Certain conditions and lifestyle choices may make you more likely to have a fall. Your health care provider may recommend: Regular vision checks. Poor vision and conditions such as cataracts can make you more likely to have a fall. If you wear glasses, make sure to get your prescription updated if your vision changes. Medicine review. Work with your health care provider to regularly review all of the medicines you are taking, including over-the-counter medicines. Ask your health care provider about any side effects that may make you more likely to have a fall. Tell your health care provider if any medicines that you take make you feel dizzy or sleepy. Strength and balance checks. Your health care provider may recommend certain tests to check your strength and balance while standing, walking, or changing positions. Foot health exam. Foot pain and numbness, as well as not wearing proper footwear, can make you more likely to have a fall. Screenings, including: Osteoporosis screening. Osteoporosis is a condition that causes  the bones to get weaker and break more easily. Blood pressure screening. Blood pressure changes and medicines to control blood pressure can make you feel dizzy. Depression screening. You may be more likely to have a fall if you have a fear of falling, feel depressed, or feel unable to do activities that you used to do. Alcohol use screening. Using too much alcohol can affect your balance and may make you more likely to have a fall. Follow these instructions at home: Lifestyle Do not drink alcohol if: Your health care provider tells you not to drink. If you drink alcohol: Limit how much you have to: 0-1 drink a day for women. 0-2 drinks a day for men. Know how much alcohol is in your drink. In the [place] one drink equals one 12 oz bottle of beer (355 mL), one 5 oz glass of wine (148 mL), or one 1 oz glass of hard liquor (44 mL). Do not use any products that contain nicotine or tobacco. These products include cigarettes, chewing tobacco, and vaping devices, such as e-cigarettes. If you need help quitting, ask your health care provider. Activity  Follow a regular exercise program to stay fit. This will help you maintain your balance. Ask your health care provider what types of exercise are appropriate for you. If you need a cane or walker, use it as recommended by your health care provider. Wear supportive shoes that have nonskid soles. Safety  Remove any tripping hazards, such as rugs, cords, and clutter. Install safety equipment such as grab bars in bathrooms and safety rails on stairs. Keep rooms and walkways  well-lit. General instructions Talk with your health care provider about your risks for falling. Tell your health care provider if: You fall. Be sure to tell your health care provider about all falls, even ones that seem minor. You feel dizzy, tiredness (fatigue), or off-balance. Take over-the-counter and prescription medicines only as told by your health care provider. These include  supplements. Eat a healthy diet and maintain a healthy weight. A healthy diet includes low-fat dairy products, low-fat (lean) meats, and fiber from whole grains, beans, and lots of fruits and vegetables. Stay current with your vaccines. Schedule regular health, dental, and eye exams. Summary Having a healthy lifestyle and getting preventive care can help to protect your health and wellness after age 11. Screening and testing are the best way to find a health problem early and help you avoid having a fall. Early diagnosis and treatment give you the best chance for managing medical conditions that are more common for people who are older than age 28. Falls are a major cause of broken bones and head injuries in people who are older than age 48. Take precautions to prevent a fall at home. Work with your health care provider to learn what changes you can make to improve your health and wellness and to prevent falls. This information is not intended to replace advice given to you by your health care provider. Make sure you discuss any questions you have with your health care provider. Document Revised: 01/31/2021 Document Reviewed: 01/31/2021 Elsevier Patient Education  2024 [employer].

## 2023-12-31 NOTE — Progress Notes (Signed)
 Subjective:    Patient ID: Ashley Bradford, female    DOB: 10-18-1970, 53 y.o.   MRN: [id number]  Chief Complaint  Patient presents with   Medical Management of Chronic Issues   PT presents to the office today for CPE and chronic follow up. She has ADHD and currently taking Vyvanse 40 mg. This is helping her stay on task on focus.    PT is morbid obese with HTN and GERD.   Complaining of day time fatigue at times.  Hypertension This is a chronic problem. The current episode started more than 1 year ago. The problem has been resolved since onset. The problem is controlled. Associated symptoms include anxiety. Pertinent negatives include no malaise/fatigue or peripheral edema. Risk factors for coronary artery disease include dyslipidemia, obesity and sedentary lifestyle. The current treatment provides moderate improvement. There is no history of heart failure.  Asthma She complains of hoarse voice. There is no wheezing. This is a chronic problem. The current episode started more than 1 year ago. The problem occurs intermittently. Associated symptoms include heartburn. Pertinent negatives include no malaise/fatigue. Her past medical history is significant for asthma.  Gastroesophageal Reflux She complains of belching, heartburn and a hoarse voice. She reports no wheezing. This is a chronic problem. The current episode started more than 1 year ago. The problem occurs occasionally. Risk factors include obesity. She has tried a PPI for the symptoms. The treatment provided moderate relief.  Insomnia Primary symptoms: difficulty falling asleep, frequent awakening, no malaise/fatigue.   The current episode started more than one year. The problem occurs intermittently. Past treatments include medication. The treatment provided mild relief.  Anxiety Presents for follow-up visit. Symptoms include excessive worry, insomnia, nervous/anxious behavior and restlessness. Symptoms occur occasionally. The  severity of symptoms is mild.   Her past medical history is significant for asthma.  Urinary Frequency  This is a chronic problem. The current episode started more than 1 year ago. The patient is experiencing no pain. Associated symptoms include frequency and urgency. Treatments tried: detrol. The treatment provided mild relief.      Review of Systems  Constitutional:  Negative for malaise/fatigue.  HENT:  Positive for hoarse voice.   Respiratory:  Negative for wheezing.   Gastrointestinal:  Positive for heartburn.  Genitourinary:  Positive for frequency and urgency.  Psychiatric/Behavioral:  The patient is nervous/anxious and has insomnia.   All other systems reviewed and are negative.  Family History  Problem Relation Age of Onset   Hypertension Mother    Hypertension Father    Social History   Socioeconomic History   Marital status: Single    Spouse name: Not on file   Number of children: Not on file   Years of education: Not on file   Highest education level: Not on file  Occupational History   Not on file  Tobacco Use   Smoking status: Never   Smokeless tobacco: Never  Vaping Use   Vaping status: Never Used  Substance and Sexual Activity   Alcohol use: Yes    Comment: occasionally   Drug use: No   Sexual activity: Not Currently    Birth control/protection: Surgical  Other Topics Concern   Not on file  Social History Narrative   Not on file   Social Drivers of Health   Financial Resource Strain: Not on file  Food Insecurity: Not on file  Transportation Needs: Not on file  Physical Activity: Not on file  Stress: Not on file  Social Connections: Not on file       Objective:   Physical Exam Vitals reviewed.  Constitutional:      General: She is not in acute distress.    Appearance: She is well-developed. She is obese.  HENT:     Head: Normocephalic and atraumatic.     Right Ear: Tympanic membrane normal.     Left Ear: Tympanic membrane normal.   Eyes:     Pupils: Pupils are equal, round, and reactive to light.  Neck:     Thyroid: No thyromegaly.  Cardiovascular:     Rate and Rhythm: Normal rate and regular rhythm.     Heart sounds: Normal heart sounds. No murmur heard. Pulmonary:     Effort: Pulmonary effort is normal. No respiratory distress.     Breath sounds: Normal breath sounds. No wheezing.  Abdominal:     General: Bowel sounds are normal. There is no distension.     Palpations: Abdomen is soft.     Tenderness: There is no abdominal tenderness.  Musculoskeletal:        General: No tenderness. Normal range of motion.     Cervical back: Normal range of motion and neck supple.  Skin:    General: Skin is warm and dry.     Findings: No rash.  Neurological:     Mental Status: She is alert and oriented to person, place, and time.     Cranial Nerves: No cranial nerve deficit.     Deep Tendon Reflexes: Reflexes are normal and symmetric.  Psychiatric:        Behavior: Behavior normal.        Thought Content: Thought content normal.        Judgment: Judgment normal.       BP 127/84   Pulse 67   Temp 98 F (36.7 C) (Temporal)   Ht 5\' 4"  (1.626 m)   Wt 257 lb (116.6 kg)   LMP 11/23/2017 (Approximate)   SpO2 97%   BMI 44.11 kg/m      Assessment & Plan:  TAYNA SMETHURST comes in today with chief complaint of Medical Management of Chronic Issues   Diagnosis and orders addressed: 1. Primary hypertension - CMP14+EGFR - CBC with Differential/Platelet  2. Gastroesophageal reflux disease without esophagitis - CMP14+EGFR - CBC with Differential/Platelet  3. ADHD, adult residual type - lisdexamfetamine (VYVANSE) 40 MG capsule; Take 1 capsule (40 mg total) by mouth daily before breakfast.  Dispense: 30 capsule; Refill: 0 - lisdexamfetamine (VYVANSE) 40 MG capsule; Take 1 capsule (40 mg total) by mouth daily before breakfast.  Dispense: 30 capsule; Refill: 0 - lisdexamfetamine (VYVANSE) 40 MG capsule; Take 1 capsule  (40 mg total) by mouth daily before breakfast.  Dispense: 30 capsule; Refill: 0 - CMP14+EGFR - CBC with Differential/Platelet  4. Controlled substance agreement signed - lisdexamfetamine (VYVANSE) 40 MG capsule; Take 1 capsule (40 mg total) by mouth daily before breakfast.  Dispense: 30 capsule; Refill: 0 - lisdexamfetamine (VYVANSE) 40 MG capsule; Take 1 capsule (40 mg total) by mouth daily before breakfast.  Dispense: 30 capsule; Refill: 0 - lisdexamfetamine (VYVANSE) 40 MG capsule; Take 1 capsule (40 mg total) by mouth daily before breakfast.  Dispense: 30 capsule; Refill: 0 - zolpidem (AMBIEN) 10 MG tablet; Take 1 tablet (10 mg total) by mouth at bedtime as needed for sleep.  Dispense: 90 tablet; Refill: 0 - CMP14+EGFR - CBC with Differential/Platelet  5. Primary insomnia - zolpidem (AMBIEN) 10 MG tablet; Take 1  tablet (10 mg total) by mouth at bedtime as needed for sleep.  Dispense: 90 tablet; Refill: 0 - CMP14+EGFR - CBC with Differential/Platelet  6. Colon cancer screening  - Ambulatory referral to Gastroenterology - CMP14+EGFR - CBC with Differential/Platelet  7. Annual physical exam (Primary) - CMP14+EGFR - CBC with Differential/Platelet - Lipid panel  8. Morbid obesity (HCC) - CMP14+EGFR - CBC with Differential/Platelet - Ambulatory referral to Sleep Studies  9. Mild intermittent asthma without complication - CMP14+EGFR - CBC with Differential/Platelet  10. Vitamin D deficiency - CMP14+EGFR - CBC with Differential/Platelet  11. GAD (generalized anxiety disorder) - CMP14+EGFR - CBC with Differential/Platelet  12. Daytime sleepiness - Ambulatory referral to Sleep Studies   Labs pending Patient reviewed in [place] controlled database, no flags noted. Contract and drug screen are up to date.  Referral to GI for colonoscopy and referral to sleep study to rule out OSA Continue current medications  Health Maintenance reviewed Diet and exercise encouraged  Follow  up plan: 3 months    Jannifer Rodney, FNP

## 2024-01-01 LAB — CMP14+EGFR
ALT: 30 IU/L (ref 0–32)
AST: 16 IU/L (ref 0–40)
Albumin: 4.2 g/dL (ref 3.8–4.9)
Alkaline Phosphatase: 94 IU/L (ref 44–121)
BUN/Creatinine Ratio: 17 (ref 9–23)
BUN: 13 mg/dL (ref 6–24)
Bilirubin Total: 0.4 mg/dL (ref 0.0–1.2)
CO2: 21 mmol/L (ref 20–29)
Calcium: 9.1 mg/dL (ref 8.7–10.2)
Chloride: 107 mmol/L — ABNORMAL HIGH (ref 96–106)
Creatinine, Ser: 0.76 mg/dL (ref 0.57–1.00)
Globulin, Total: 2 g/dL (ref 1.5–4.5)
Glucose: 86 mg/dL (ref 70–99)
Potassium: 4.2 mmol/L (ref 3.5–5.2)
Sodium: 142 mmol/L (ref 134–144)
Total Protein: 6.2 g/dL (ref 6.0–8.5)
eGFR: 94 mL/min/{1.73_m2} (ref >59)

## 2024-01-01 LAB — CBC WITH DIFFERENTIAL/PLATELET
Basophils Absolute: 0.1 10*3/uL (ref 0.0–0.2)
Basos: 1 %
EOS (ABSOLUTE): 0.2 10*3/uL (ref 0.0–0.4)
Eos: 3 %
Hematocrit: 38.4 % (ref 34.0–46.6)
Hemoglobin: 13 g/dL (ref 11.1–15.9)
Immature Grans (Abs): 0 10*3/uL (ref 0.0–0.1)
Immature Granulocytes: 0 %
Lymphocytes Absolute: 2 10*3/uL (ref 0.7–3.1)
Lymphs: 26 %
MCH: 31.3 pg (ref 26.6–33.0)
MCHC: 33.9 g/dL (ref 31.5–35.7)
MCV: 93 fL (ref 79–97)
Monocytes Absolute: 0.5 10*3/uL (ref 0.1–0.9)
Monocytes: 7 %
Neutrophils Absolute: 4.7 10*3/uL (ref 1.4–7.0)
Neutrophils: 63 %
Platelets: 270 10*3/uL (ref 150–450)
RBC: 4.15 x10E6/uL (ref 3.77–5.28)
RDW: 12.2 % (ref 11.7–15.4)
WBC: 7.5 10*3/uL (ref 3.4–10.8)

## 2024-01-01 LAB — LIPID PANEL
Cholesterol, Total: 147 mg/dL (ref 100–199)
HDL: 60 mg/dL (ref >39)
LDL CALC COMMENT:: 2.5 ratio (ref 0.0–4.4)
LDL Chol Calc (NIH): 75 mg/dL (ref 0–99)
Triglycerides: 59 mg/dL (ref 0–149)
VLDL Cholesterol Cal: 12 mg/dL (ref 5–40)

## 2024-01-01 MED ORDER — TROSPIUM CHLORIDE 20 MG PO TABS: 20.0000 mg | ORAL_TABLET | Freq: Two times a day (BID) | ORAL | 1 refills | Status: DC

## 2024-01-01 NOTE — Addendum Note (Signed)
 Addended by: Jannifer Rodney A on: 01/01/2024 01:26 PM   Modules accepted: Orders

## 2024-01-10 ENCOUNTER — Other Ambulatory Visit: Payer: Self-pay | Admitting: Family

## 2024-01-10 DIAGNOSIS — J4520 Mild intermittent asthma, uncomplicated: Secondary | ICD-10-CM

## 2024-01-24 ENCOUNTER — Encounter: Payer: Self-pay | Admitting: Family

## 2024-02-01 ENCOUNTER — Telehealth: Payer: Self-pay | Admitting: Family Medicine

## 2024-02-01 NOTE — Telephone Encounter (Signed)
 Copied from CRM [id number]. Topic: Clinical - Medication Question >> Feb 01, 2024  3:59 PM Shelby Dessert H wrote: Reason for CRM: Patient called and said she sent over a message via MyChart that says "Christy, I know we have talked about this med during at least the last 3 visits. I have heard some people getting it approved based on risk of MI or CVA. Can you submit a letter on my behalf to see if we can get it approved? I have drafted one for you  (see attached). Not sure if we have discussed the fact my brother, who is 109 months younger than me, has already suffered a CVA. Your help would be appreciated. Thanks!" patient has not received a callback about this, can you please call the patient back at [phone].

## 2024-02-04 ENCOUNTER — Institutional Professional Consult (permissible substitution): Admitting: Neurology

## 2024-02-05 ENCOUNTER — Encounter: Payer: Self-pay | Admitting: Family

## 2024-02-07 ENCOUNTER — Other Ambulatory Visit: Payer: Self-pay | Admitting: Family

## 2024-02-07 DIAGNOSIS — Z713 Dietary counseling and surveillance: Secondary | ICD-10-CM

## 2024-02-07 DIAGNOSIS — I10 Essential (primary) hypertension: Secondary | ICD-10-CM

## 2024-02-07 DIAGNOSIS — Z8249 Family history of ischemic heart disease and other diseases of the circulatory system: Secondary | ICD-10-CM

## 2024-02-07 MED ORDER — TIRZEPATIDE-WEIGHT MANAGEMENT 2.5 MG/0.5ML ~~LOC~~ SOLN: 2.5000 mg | SUBCUTANEOUS | 2 refills | Status: DC

## 2024-02-07 NOTE — Telephone Encounter (Signed)
 Replied to [employer].

## 2024-02-07 NOTE — Progress Notes (Signed)
 Can we check with her insurance about appealing the Ashley Bradford and the criteria for her insurance to pay for this.

## 2024-02-11 ENCOUNTER — Ambulatory Visit: Admitting: Neurology

## 2024-02-11 ENCOUNTER — Telehealth: Payer: Self-pay | Admitting: Family

## 2024-02-11 ENCOUNTER — Encounter: Payer: Self-pay | Admitting: Neurology

## 2024-02-11 VITALS — BP 135/77 | HR 68 | Ht 64.0 in | Wt 258.0 lb

## 2024-02-11 DIAGNOSIS — F5104 Psychophysiologic insomnia: Secondary | ICD-10-CM | POA: Diagnosis not present

## 2024-02-11 DIAGNOSIS — F411 Generalized anxiety disorder: Secondary | ICD-10-CM | POA: Diagnosis not present

## 2024-02-11 DIAGNOSIS — F908 Attention-deficit hyperactivity disorder, other type: Secondary | ICD-10-CM

## 2024-02-11 DIAGNOSIS — R0689 Other abnormalities of breathing: Secondary | ICD-10-CM | POA: Diagnosis not present
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
## 2024-02-13 NOTE — Telephone Encounter (Signed)
Aware form ready

## 2024-03-20 ENCOUNTER — Other Ambulatory Visit: Payer: Self-pay | Admitting: Family

## 2024-03-20 DIAGNOSIS — Z79899 Other long term (current) drug therapy: Secondary | ICD-10-CM

## 2024-03-20 DIAGNOSIS — F5101 Primary insomnia: Secondary | ICD-10-CM

## 2024-03-31 ENCOUNTER — Ambulatory Visit (INDEPENDENT_AMBULATORY_CARE_PROVIDER_SITE_OTHER): Admitting: Family

## 2024-03-31 ENCOUNTER — Encounter: Payer: Self-pay | Admitting: Family

## 2024-03-31 VITALS — BP 129/82 | Resp 18 | Ht 64.0 in | Wt 253.0 lb

## 2024-03-31 DIAGNOSIS — F908 Attention-deficit hyperactivity disorder, other type: Secondary | ICD-10-CM | POA: Diagnosis not present

## 2024-03-31 DIAGNOSIS — Z79899 Other long term (current) drug therapy: Secondary | ICD-10-CM

## 2024-03-31 DIAGNOSIS — F5101 Primary insomnia: Secondary | ICD-10-CM

## 2024-03-31 DIAGNOSIS — J4520 Mild intermittent asthma, uncomplicated: Secondary | ICD-10-CM

## 2024-03-31 DIAGNOSIS — G8929 Other chronic pain: Secondary | ICD-10-CM

## 2024-03-31 DIAGNOSIS — F411 Generalized anxiety disorder: Secondary | ICD-10-CM

## 2024-03-31 DIAGNOSIS — Z713 Dietary counseling and surveillance: Secondary | ICD-10-CM

## 2024-03-31 DIAGNOSIS — I10 Essential (primary) hypertension: Secondary | ICD-10-CM | POA: Diagnosis not present

## 2024-03-31 DIAGNOSIS — R0689 Other abnormalities of breathing: Secondary | ICD-10-CM

## 2024-03-31 DIAGNOSIS — K219 Gastro-esophageal reflux disease without esophagitis: Secondary | ICD-10-CM

## 2024-03-31 DIAGNOSIS — M545 Low back pain, unspecified: Secondary | ICD-10-CM

## 2024-03-31 MED ORDER — LISDEXAMFETAMINE DIMESYLATE 50 MG PO CAPS: 50.0000 mg | ORAL_CAPSULE | Freq: Every day | ORAL | 0 refills | Status: DC

## 2024-03-31 MED ORDER — ZOLPIDEM TARTRATE 10 MG PO TABS: 10.0000 mg | ORAL_TABLET | Freq: Every evening | ORAL | 0 refills | Status: DC | PRN

## 2024-03-31 MED ORDER — SEMAGLUTIDE-WEIGHT MANAGEMENT 0.25 MG/0.5ML ~~LOC~~ SOAJ: 0.2500 mg | SUBCUTANEOUS | 2 refills | Status: AC

## 2024-03-31 NOTE — Progress Notes (Signed)
 Subjective:    Patient ID: Ashley Bradford, female    DOB: 09-11-1971, 53 y.o.   MRN: [id number]  Chief Complaint  Patient presents with   Medical Management of Chronic Issues   PT presents to the office today for chronic follow up. She has ADHD and currently taking Vyvanse  40 mg. States she feels like this is wearing off and she is having a hard time staying on task. Would like to increase today if possible.    PT is morbid obese with HTN and GERD.   Complaining of day time fatigue at times.   She has a therapy pool at her gym and wants paperwork completed to get into this. States she has lower back pain and bilateral knee pain of 7 out 10 at times. She goes to the chiropractor that helps, but thinks the therapy pool will be beneficial.  Hypertension This is a chronic problem. The current episode started more than 1 year ago. The problem has been resolved since onset. The problem is controlled. Associated symptoms include anxiety. Pertinent negatives include no malaise/fatigue or peripheral edema. Risk factors for coronary artery disease include dyslipidemia, obesity and sedentary lifestyle. The current treatment provides moderate improvement. There is no history of heart failure.  Asthma There is no cough, frequent throat clearing, hoarse voice or wheezing. This is a chronic problem. The current episode started more than 1 year ago. The problem occurs intermittently. Associated symptoms include heartburn. Pertinent negatives include no malaise/fatigue. Her past medical history is significant for asthma.  Gastroesophageal Reflux She complains of belching and heartburn. She reports no coughing, no hoarse voice or no wheezing. This is a chronic problem. The current episode started more than 1 year ago. The problem occurs occasionally. The symptoms are aggravated by certain foods. Risk factors include obesity. She has tried a PPI for the symptoms. The treatment provided mild relief.   Insomnia Primary symptoms: difficulty falling asleep, frequent awakening, no malaise/fatigue.   The current episode started more than one year. The problem occurs intermittently. Past treatments include medication. The treatment provided mild relief.  Anxiety Presents for follow-up visit. Symptoms include excessive worry, insomnia, nervous/anxious behavior and restlessness. Symptoms occur occasionally. The severity of symptoms is mild.   Her past medical history is significant for asthma.  Urinary Frequency  This is a chronic problem. The current episode started more than 1 year ago. The patient is experiencing no pain. Associated symptoms include frequency and urgency. Treatments tried: detrol . The treatment provided mild relief.      Review of Systems  Constitutional:  Negative for malaise/fatigue.  HENT:  Negative for hoarse voice.   Respiratory:  Negative for cough and wheezing.   Gastrointestinal:  Positive for heartburn.  Genitourinary:  Positive for frequency and urgency.  Psychiatric/Behavioral:  The patient is nervous/anxious and has insomnia.   All other systems reviewed and are negative.  Family History  Problem Relation Age of Onset   Hypertension Mother    Hypertension Father    Social History   Socioeconomic History   Marital status: Single    Spouse name: Not on file   Number of children: Not on file   Years of education: Not on file   Highest education level: Not on file  Occupational History   Not on file  Tobacco Use   Smoking status: Never   Smokeless tobacco: Never  Vaping Use   Vaping status: Never Used  Substance and Sexual Activity   Alcohol use: Yes  Comment: occasionally   Drug use: No   Sexual activity: Not Currently    Birth control/protection: Surgical  Other Topics Concern   Not on file  Social History Narrative   Not on file   Social Drivers of Health   Financial Resource Strain: Not on file  Food Insecurity: Not on file   Transportation Needs: Not on file  Physical Activity: Not on file  Stress: Not on file  Social Connections: Not on file       Objective:   Physical Exam Vitals reviewed.  Constitutional:      General: She is not in acute distress.    Appearance: She is well-developed. She is obese.  HENT:     Head: Normocephalic and atraumatic.     Right Ear: Tympanic membrane normal.     Left Ear: Tympanic membrane normal.  Eyes:     Pupils: Pupils are equal, round, and reactive to light.  Neck:     Thyroid : No thyromegaly.  Cardiovascular:     Rate and Rhythm: Normal rate and regular rhythm.     Heart sounds: Normal heart sounds. No murmur heard. Pulmonary:     Effort: Pulmonary effort is normal. No respiratory distress.     Breath sounds: Normal breath sounds. No wheezing.  Abdominal:     General: Bowel sounds are normal. There is no distension.     Palpations: Abdomen is soft.     Tenderness: There is no abdominal tenderness.  Musculoskeletal:        General: No tenderness. Normal range of motion.     Cervical back: Normal range of motion and neck supple.  Skin:    General: Skin is warm and dry.     Findings: No rash.  Neurological:     Mental Status: She is alert and oriented to person, place, and time.     Cranial Nerves: No cranial nerve deficit.     Deep Tendon Reflexes: Reflexes are normal and symmetric.  Psychiatric:        Behavior: Behavior normal.        Thought Content: Thought content normal.        Judgment: Judgment normal.       BP 129/82   Resp 18   Ht 5' 4 (1.626 m)   Wt 253 lb (114.8 kg)   LMP 11/23/2017 (Approximate)   BMI 43.43 kg/m      Assessment & Plan:  Ashley Bradford comes in today with chief complaint of Medical Management of Chronic Issues   Diagnosis and orders addressed: 1. ADHD, adult residual type - lisdexamfetamine (VYVANSE ) 50 MG capsule; Take 1 capsule (50 mg total) by mouth daily before breakfast.  Dispense: 30 capsule; Refill:  0 - lisdexamfetamine (VYVANSE ) 50 MG capsule; Take 1 capsule (50 mg total) by mouth daily before breakfast.  Dispense: 30 capsule; Refill: 0 - lisdexamfetamine (VYVANSE ) 50 MG capsule; Take 1 capsule (50 mg total) by mouth daily before breakfast.  Dispense: 30 capsule; Refill: 0  2. Controlled substance agreement signed - zolpidem  (AMBIEN ) 10 MG tablet; Take 1 tablet (10 mg total) by mouth at bedtime as needed for sleep.  Dispense: 90 tablet; Refill: 0 - lisdexamfetamine (VYVANSE ) 50 MG capsule; Take 1 capsule (50 mg total) by mouth daily before breakfast.  Dispense: 30 capsule; Refill: 0 - lisdexamfetamine (VYVANSE ) 50 MG capsule; Take 1 capsule (50 mg total) by mouth daily before breakfast.  Dispense: 30 capsule; Refill: 0 - lisdexamfetamine (VYVANSE ) 50 MG capsule; Take  1 capsule (50 mg total) by mouth daily before breakfast.  Dispense: 30 capsule; Refill: 0  3. Primary insomnia  - zolpidem  (AMBIEN ) 10 MG tablet; Take 1 tablet (10 mg total) by mouth at bedtime as needed for sleep.  Dispense: 90 tablet; Refill: 0  4. Primary hypertension (Primary) - Semaglutide -Weight Management 0.25 MG/0.5ML SOAJ; Inject 0.25 mg into the skin once a week for 28 days.  Dispense: 2 mL; Refill: 2  5. Gastroesophageal reflux disease without esophagitis - Semaglutide -Weight Management 0.25 MG/0.5ML SOAJ; Inject 0.25 mg into the skin once a week for 28 days.  Dispense: 2 mL; Refill: 2  6. Morbid obesity (HCC)  - Semaglutide -Weight Management 0.25 MG/0.5ML SOAJ; Inject 0.25 mg into the skin once a week for 28 days.  Dispense: 2 mL; Refill: 2  7. Mild intermittent asthma without complication  8. GAD (generalized anxiety disorder)   9. Sleep related choking sensation   10. Chronic bilateral low back pain without sciatica - Semaglutide -Weight Management 0.25 MG/0.5ML SOAJ; Inject 0.25 mg into the skin once a week for 28 days.  Dispense: 2 mL; Refill: 2  11. Weight loss counseling, encounter for -  Semaglutide -Weight Management 0.25 MG/0.5ML SOAJ; Inject 0.25 mg into the skin once a week for 28 days.  Dispense: 2 mL; Refill: 2    Labs pending Will increase Vyvanse  to 50 mg from 40 mg Meds as prescribed Behavior modification as needed Follow-up for recheck in 3 months  Patient reviewed in [place] controlled database, no flags noted. Contract and drug screen are up to date.  Will order Wegovy  0.25 mg today. Unsure if insurance will pay this. Has been going to the gym 10 days a month and working with a Systems analyst.  Continue current medications  Health Maintenance reviewed Diet and exercise encouraged  Follow up plan: 3 months    Bari Learn, FNP

## 2024-03-31 NOTE — Patient Instructions (Signed)
 GERD in Adults: What to Know  Gastroesophageal reflux (GER) is when acid from your stomach flows up into your esophagus. Your esophagus is the part of your body that moves food from your mouth to your stomach. Normally, food goes down and stays in your stomach to be digested. But with GER, food and stomach acid may go back up. You may have a disease called gastroesophageal reflux disease (GERD) if the reflux: Happens often. Causes very bad symptoms. Makes your esophagus sore and swollen. Over time, GERD can make small holes called ulcers in the lining of your esophagus. What are the causes? GERD is caused by a problem with the muscle between your esophagus and stomach. This muscle is called the lower esophageal sphincter (LES). When it's weak or not normal, it doesn't close like it should. This means food and stomach acid can go back up into your esophagus. The muscle can be weak if: You smoke or use products with tobacco in them. You're pregnant. You have a type of hernia called a hiatal hernia. You eat certain foods and drinks. These include: Alcohol. Coffee. Chocolate. Onions. Peppermint. What increases the risk? Being overweight. Having a disease that affects your connective tissue. Taking NSAIDs, such as ibuprofen. What are the signs or symptoms? Heartburn. Trouble swallowing. Pain when you swallow. The feeling of having a lump in your throat. A bitter taste in your mouth. Bad breath. Having an upset or bloated stomach. Burping. Chest pain. Other conditions can also cause chest pain. Make sure you see your health care provider if you have chest pain. Wheezing. This is when you make high-pitched whistling sounds when you breathe, most often when you breathe out. A long-term cough or a cough at night. How is this diagnosed? GERD may be diagnosed based on your medical history and a physical exam. You may also have tests. These may include: An endoscopy. This test looks at your  stomach and esophagus with a small camera. A barium swallow test. This shows the shape and size of your esophagus and how well it's working. Tests of your esophagus to check for: Acid levels. Pressure. How is this treated? Treatment may depend on how bad your symptoms are. It may include: Changes to your diet and daily life. Medicines. Surgery. Follow these instructions at home: Eating and drinking Follow an eating plan as told by your provider. You may need to avoid certain foods and drinks. These may include: Coffee and tea, with or without caffeine. Alcohol. Energy drinks and sports drinks. Fizzy drinks or sodas. Chocolate and cocoa. Peppermint and mint flavorings. Garlic and onions. Horseradish. Spicy and acidic foods. These include: Peppers. Chili powder and curry powder. Vinegar. Hot sauces and BBQ sauce. Citrus fruits and juices. These include: Oranges. Lemons. Limes. Tomato-based foods. These include: Red sauce and pizza with red sauce. Chili. Salsa. Fried and fatty foods. These include: Donuts. [place] fries. Potato chips. High-fat dressings. High-fat meats. These include: Hot dogs and sausage. Rib eye steak. Ham and bacon. High-fat dairy items. These include: Whole milk. Butter. Cream cheese. Eat small meals often. Avoid eating big meals. Avoid drinking lots of liquid with your meals. Try not to eat meals during the 2-3 hours before bedtime. Try not to lie down right after you eat. Do not exercise right after you eat. Lifestyle  If you're overweight, lose an amount of weight that's healthy for you. Ask your provider about a safe weight loss goal. Do not smoke, vape, or use nicotine or tobacco. Wear  loose clothes. Do not wear things that are tight around your waist. When you sleep, try: Raising the head of your bed about 6 inches (15 cm). You can use a wedge to do this. Lying down on your left side. Try to lower your stress. If you need help doing  this, ask your provider. General instructions Take your medicines only as told. Do not take aspirin or ibuprofen unless you're told to. Watch for any changes in your symptoms. Do not bend over if it makes your symptoms worse. Contact a health care provider if: You have new symptoms. You have trouble: Drinking. Swallowing. Eating. It hurts to swallow. You have wheezing. You have a cough that won't go away. Your voice is hoarse. Your symptoms don't get better with treatment. Get help right away if: You have pain all of a sudden in your: Arm. Neck. Jaw. Teeth. Back. You feel sweaty, dizzy, or light-headed all of a sudden. You faint. You have chest pain or shortness of breath. You vomit and the vomit is: Green, yellow, or black. Looks like blood or coffee grounds. Your poop is red, bloody, or black. These symptoms may be an emergency. Call 911 right away. Do not wait to see if the symptoms will go away. Do not drive yourself to the hospital. This information is not intended to replace advice given to you by your health care provider. Make sure you discuss any questions you have with your health care provider. Document Revised: 07/24/2023 Document Reviewed: 02/07/2023 Elsevier Patient Education  2024 [employer].

## 2024-04-02 ENCOUNTER — Telehealth: Payer: Self-pay

## 2024-04-02 ENCOUNTER — Other Ambulatory Visit (HOSPITAL_COMMUNITY): Payer: Self-pay

## 2024-04-02 NOTE — Telephone Encounter (Signed)
 Pharmacy Patient Advocate Encounter   Received notification from [employer] that prior authorization for Wegovy  0.25 mg/0.5 ml auto injector is required/requested.   Insurance verification completed.   The patient is insured through [employer] .   Per test claim: product/service not covered, Product not covered for weight loss

## 2024-04-03 NOTE — Telephone Encounter (Signed)
 Please let the patient know

## 2024-04-03 NOTE — Telephone Encounter (Signed)
 Called to advise patient she is wanting to know if we got the [employer] letter sent to us  about how to get it cover

## 2024-04-04 ENCOUNTER — Ambulatory Visit: Admitting: Neurology

## 2024-04-04 DIAGNOSIS — R0689 Other abnormalities of breathing: Secondary | ICD-10-CM

## 2024-04-04 DIAGNOSIS — F5104 Psychophysiologic insomnia: Secondary | ICD-10-CM

## 2024-04-04 DIAGNOSIS — F908 Attention-deficit hyperactivity disorder, other type: Secondary | ICD-10-CM

## 2024-04-04 DIAGNOSIS — G4733 Obstructive sleep apnea (adult) (pediatric): Secondary | ICD-10-CM

## 2024-04-04 DIAGNOSIS — F411 Generalized anxiety disorder: Secondary | ICD-10-CM

## 2024-04-09 ENCOUNTER — Encounter (INDEPENDENT_AMBULATORY_CARE_PROVIDER_SITE_OTHER): Payer: Self-pay

## 2024-04-09 ENCOUNTER — Other Ambulatory Visit (HOSPITAL_COMMUNITY): Payer: Self-pay

## 2024-04-09 DIAGNOSIS — L304 Erythema intertrigo: Secondary | ICD-10-CM | POA: Diagnosis not present

## 2024-04-09 NOTE — Telephone Encounter (Signed)
 Patient called. States she would like to speak with Christys Nurse. She just sent her a message in MyChart. Called CAL - no Answer. Patient would like a call back. Thank You

## 2024-04-10 MED ORDER — FLUCONAZOLE 150 MG PO TABS: 150.0000 mg | ORAL_TABLET | ORAL | 0 refills | Status: DC | PRN

## 2024-04-10 NOTE — Telephone Encounter (Signed)
 No option for coverage. This is a plan exclusion and will not be covered.

## 2024-04-10 NOTE — Telephone Encounter (Signed)
 Patient aware and verbalized understanding.

## 2024-04-10 NOTE — Telephone Encounter (Signed)
 Patient called. States she would like to speak with Christys Nurse. She just sent her a message in MyChart. Called CAL - no Answer. Patient would like a call back. Thank You    Approximately 5 minutes was spent documenting and reviewing patient's chart.

## 2024-04-17 NOTE — Progress Notes (Signed)
 [facility]  Ashley Bradford 53 year old female 02-28-1971   HOME SLEEP TEST REPORT ( by Elene  mail -out device )    Study Protocol:     The SANSA single-point-of-skin-contact chest-worn sensor - an FDA cleared and DOT approved type 4 home sleep test device - measures eight physiological channels,  including blood oxygen saturation (measured via PPG [photoplethysmography]), EKG-derived heart rate, respiratory effort, chest movement (measured via accelerometer), snoring, body position, and actigraphy. The device is designed to be worn for up to 10 hours per study.    STUDY DATE:  04-10-2024 Data received :  04-17-2024     ORDERING CLINICIAN: Dedra Gores, MD  REFERRING CLINICIAN: Tully Jacob, NP    CLINICAL INFORMATION/HISTORY: 5-19 2025 Obesity, ADHD and  Insomnia, sleep choking, at risk for OSA. Ashley Bradford is a 53 y.o. female patient who is seen  in a sleep medicine consultation upon referral from NP [facility] on 02/11/2024 .    Chief concern according to patient :   I have had isolated sleep choking,  but I just don't sleep enough . takes Vyvance and previously has taken other ADHD meds since her 20's. She used to doodle and talk in class, she developed  strategies to work through this. Vyvance has kept her going in daytime. She feels her brain doesn't shut off,  she reports her mind is working on problems and sometimes she solves these problems. She used to commit these to paper, now she writes them on her phone.  This is not conducive to falling asleep and she had tried Ambien  but this caused crazy  dreams. Also contributing to her sleep problems are  45 pound of weight gain since the pandemic. SABRA     Epworth sleepiness score: 4-5/ 24 points .  FSS endorsed at 16/ 63 points.   BMI: 44.3 kg/m  Neck Circumference: 16   FINDINGS:  Sleep Summary:   Total Recording Time (hours, min):   9 hours 51 minutes  Total Sleep Time (hours, min):   7 hours 25 minutes,  with an effective sleep time of 7 hours 1 minute.  Sleep latency was 5 minutes  Sleep efficiency %;    75%                                   Respiratory Indices:   by AASM criteria of scoring;    Calculated pAHI (per hour): 13.9/h                                               Positional  respiratory activity  / snoring : The majority of sleep was in supine position but there were intermittent left and prone sleep seen.  Snoring was moderate.  Oxygen Saturation  in Sleep    Oxygen Saturation (%) Mean:   Mean oxygen saturation was 94.6% between the nadir at only 78.1% and a maximum saturation of 99.6%.  There were only 2 minutes of hypoxemia noted , defined as below 89% saturation                 Pulse Rate in Sleep :   Pulse Mean (bpm):     The mean heart rate was 66 bpm, between 58 and  84 bpm.    The cardiac rhythm was normal sinus rhythm throughout the study.                   IMPRESSION:  This HST confirms the presence of mild to moderate sleep apnea of purely obstructive origin.   RECOMMENDATION: I recommend for the patient to consider CPAP therapy, with a ResMed auto titration device between 5 and 15 cm of water, 2 cm EPR and heated humidification.  If the patient feels that CPAP is not something she can tolerate she may consider a dental device.  I had noted the patient's overbite and she may benefit from a corrective dental device. The main risk factor for this patient is her weight and with a diagnosis of obstructive sleep apnea she is hopefully able to find medical and medication assistance as needed. The condition of insomnia however is unlikely to respond to apnea treatment and at this case a chronic condition.  Cognitive behavioral therapy is my recommendation, I do not provide Ambien  for continuous use.  I like for the patient to follow-up in the sleep clinic if she is agreeable to trying CPAP - we would then meet for a compliance visit .  I would be happy to provide a  sleep dentistry referral should the patient want to pursue this avenue, and follow up would then be optional .   Any Patient endorsing a high level of sleepiness should be cautioned not to drive, work at heights, or operate dangerous machinery or heavy equipment when tired or sleepy.   Review of good sleep hygiene measures took place in the initial consultation but should be revisited ( Your guide to better sleep  a publication by the NIH is a good source of information).    The referring provider will be notified of the test results.    I certify that I have reviewed the raw data recording prior to the issuance of this report in accordance with the standards of the American Academy of Sleep Medicine (AASM).    INTERPRETING PHYSICIAN:   Dedra Gores, MD  [facility] Sleep Board certified by The [employer] of Sleep Medicine and Diplomate of the [employer] of Sleep Medicine. Board certified In Neurology through the ABPN, Fellow of the [employer] of Neu[employer].

## 2024-04-18 ENCOUNTER — Other Ambulatory Visit: Payer: Self-pay | Admitting: Family

## 2024-04-18 DIAGNOSIS — J4520 Mild intermittent asthma, uncomplicated: Secondary | ICD-10-CM

## 2024-04-25 ENCOUNTER — Ambulatory Visit: Payer: Self-pay | Admitting: Neurology

## 2024-04-25 ENCOUNTER — Other Ambulatory Visit: Payer: Self-pay | Admitting: Family

## 2024-04-25 DIAGNOSIS — F908 Attention-deficit hyperactivity disorder, other type: Secondary | ICD-10-CM

## 2024-04-25 DIAGNOSIS — F5104 Psychophysiologic insomnia: Secondary | ICD-10-CM

## 2024-04-25 DIAGNOSIS — G4733 Obstructive sleep apnea (adult) (pediatric): Secondary | ICD-10-CM

## 2024-04-25 DIAGNOSIS — R0689 Other abnormalities of breathing: Secondary | ICD-10-CM

## 2024-04-25 NOTE — Procedures (Signed)
 [facility]  Ashley Bradford 53 year old female Jan 07, 1971   HOME SLEEP TEST REPORT ( by Elene  mail -out device )    Study Protocol:     The SANSA single-point-of-skin-contact chest-worn sensor - an FDA cleared and DOT approved type 4 home sleep test device - measures eight physiological channels,  including blood oxygen saturation (measured via PPG [photoplethysmography]), EKG-derived heart rate, respiratory effort, chest movement (measured via accelerometer), snoring, body position, and actigraphy. The device is designed to be worn for up to 10 hours per study.    STUDY DATE:  04-10-2024 Data received :  04-17-2024     ORDERING CLINICIAN: Dedra Gores, MD  REFERRING CLINICIAN: Tully Jacob, NP    CLINICAL INFORMATION/HISTORY: 5-19 2025 Obesity, ADHD and  Insomnia, sleep choking, at risk for OSA. Ashley Bradford is a 53 y.o. female patient who is seen  in a sleep medicine consultation upon referral from NP [facility] on 02/11/2024 .    Chief concern according to patient :   I have had isolated sleep choking,  but I just don't sleep enough . takes Vyvance and previously has taken other ADHD meds since her 66's. She used to doodle and talk in class, she developed  strategies to work through this. Vyvance has kept her going in daytime. She feels her brain doesn't shut off,  she reports her mind is working on problems and sometimes she solves these problems. She used to commit these to paper, now she writes them on her phone.  This is not conducive to falling asleep and she had tried Ambien  but this caused crazy  dreams. Also contributing to her sleep problems are  45 pound of weight gain since the pandemic. Ashley Bradford     Epworth sleepiness score: 4-5/ 24 points .  FSS endorsed at 16/ 63 points.   BMI: 44.3 kg/m  Neck Circumference: 16   FINDINGS:  Sleep Summary:   Total Recording Time (hours, min):   9 hours 51 minutes  Total Sleep Time (hours, min):   7 hours 25 minutes,  with an effective sleep time of 7 hours 1 minute.  Sleep latency was 5 minutes  Sleep efficiency %;    75%                                   Respiratory Indices:   by AASM criteria of scoring;    Calculated pAHI (per hour): 13.9/h                                               Positional  respiratory activity  / snoring : The majority of sleep was in supine position but there were intermittent left and prone sleep seen.  Snoring was moderate.  Oxygen Saturation  in Sleep    Oxygen Saturation (%) Mean:   Mean oxygen saturation was 94.6% between the nadir at only 78.1% and a maximum saturation of 99.6%.  There were only 2 minutes of hypoxemia noted , defined as below 89% saturation                 Pulse Rate in Sleep :   Pulse Mean (bpm):     The mean heart rate was 66 bpm, between 58 and  84 bpm.    The cardiac rhythm was normal sinus rhythm throughout the study.                   IMPRESSION:  This HST confirms the presence of mild to moderate sleep apnea of purely obstructive origin.   RECOMMENDATION: I recommend for the patient to consider CPAP therapy, with a ResMed auto titration device between 5 and 15 cm of water, 2 cm EPR and heated humidification.  If the patient feels that CPAP is not something she can tolerate she may consider a dental device.  I had noted the patient's overbite and she may benefit from a corrective dental device. The main risk factor for this patient is her weight and with a diagnosis of obstructive sleep apnea she is hopefully able to find medical and medication assistance as needed. The condition of insomnia however is unlikely to respond to apnea treatment and at this case a chronic condition.  Cognitive behavioral therapy is my recommendation, I do not provide Ambien  for continuous use.  I like for the patient to follow-up in the sleep clinic if she is agreeable to trying CPAP - we would then meet for a compliance visit .  I would be happy to provide a  sleep dentistry referral should the patient want to pursue this avenue, and follow up would then be optional .   Any Patient endorsing a high level of sleepiness should be cautioned not to drive, work at heights, or operate dangerous machinery or heavy equipment when tired or sleepy.   Review of good sleep hygiene measures took place in the initial consultation but should be revisited ( Your guide to better sleep  a publication by the NIH is a good source of information).    The referring provider will be notified of the test results.    I certify that I have reviewed the raw data recording prior to the issuance of this report in accordance with the standards of the American Academy of Sleep Medicine (AASM).    INTERPRETING PHYSICIAN:   Dedra Gores, MD  [facility] Sleep Board certified by The [employer] of Sleep Medicine and Diplomate of the [employer] Sleep Medicine. Board certified In Neurology through the ABPN, Fellow of the [employer].

## 2024-04-30 NOTE — Telephone Encounter (Deleted)
-----   Message from Antimony Dohmeier sent at 04/25/2024  7:37 PM EDT -----  This HST confirms the presence of mild to moderate sleep apnea of purely obstructive origin.   RECOMMENDATION: I recommend for the patient to consider CPAP therapy, with a ResMed auto titration device between 5 and 15 cm of water, 2 cm EPR and heated humidification.    If the patient feels that CPAP is not something she can tolerate she may consider a dental device.  I had noted the patient's overbite and she may benefit from a corrective dental device.  The main risk factor for OSA in this patient is her weight . With the diagnosis of obstructive sleep apnea ,she is hopefully able to find medical and medication assistance as needed.  The condition of insomnia however is unlikely to respond to apnea treatment and at this case a chronic condition.  Cognitive behavioral therapy is my recommendation, I do not provide Ambien  for  continuous use.   ----- Message ----- From: Chalice Saunas, MD Sent: 04/25/2024   7:33 PM EDT To: Saunas Chalice, MD

## 2024-05-08 ENCOUNTER — Ambulatory Visit: Admitting: Family

## 2024-06-10 ENCOUNTER — Other Ambulatory Visit: Payer: Self-pay | Admitting: Family

## 2024-06-10 DIAGNOSIS — Z79899 Other long term (current) drug therapy: Secondary | ICD-10-CM

## 2024-06-10 DIAGNOSIS — F5101 Primary insomnia: Secondary | ICD-10-CM

## 2024-06-20 ENCOUNTER — Ambulatory Visit: Admitting: Family

## 2024-06-20 ENCOUNTER — Encounter: Payer: Self-pay | Admitting: Family

## 2024-06-20 ENCOUNTER — Other Ambulatory Visit: Payer: Self-pay | Admitting: Family

## 2024-06-20 VITALS — BP 132/79 | HR 69 | Temp 97.9°F | Ht 64.0 in | Wt 252.0 lb

## 2024-06-20 DIAGNOSIS — I10 Essential (primary) hypertension: Secondary | ICD-10-CM | POA: Diagnosis not present

## 2024-06-20 DIAGNOSIS — F908 Attention-deficit hyperactivity disorder, other type: Secondary | ICD-10-CM

## 2024-06-20 DIAGNOSIS — Z79899 Other long term (current) drug therapy: Secondary | ICD-10-CM

## 2024-06-20 DIAGNOSIS — F5101 Primary insomnia: Secondary | ICD-10-CM

## 2024-06-20 DIAGNOSIS — F411 Generalized anxiety disorder: Secondary | ICD-10-CM | POA: Diagnosis not present

## 2024-06-20 DIAGNOSIS — E559 Vitamin D deficiency, unspecified: Secondary | ICD-10-CM

## 2024-06-20 DIAGNOSIS — K219 Gastro-esophageal reflux disease without esophagitis: Secondary | ICD-10-CM

## 2024-06-20 DIAGNOSIS — J4520 Mild intermittent asthma, uncomplicated: Secondary | ICD-10-CM

## 2024-06-20 MED ORDER — LISDEXAMFETAMINE DIMESYLATE 50 MG PO CAPS: 50.0000 mg | ORAL_CAPSULE | Freq: Every day | ORAL | 0 refills | Status: DC

## 2024-06-20 MED ORDER — BUSPIRONE HCL 5 MG PO TABS: ORAL_TABLET | ORAL | 2 refills | Status: DC

## 2024-06-20 MED ORDER — ZOLPIDEM TARTRATE 10 MG PO TABS: 10.0000 mg | ORAL_TABLET | Freq: Every evening | ORAL | 0 refills | Status: DC | PRN

## 2024-06-20 MED ORDER — IBUPROFEN 800 MG PO TABS: 800.0000 mg | ORAL_TABLET | Freq: Two times a day (BID) | ORAL | 3 refills | Status: DC | PRN

## 2024-06-20 MED ORDER — PANTOPRAZOLE SODIUM 20 MG PO TBEC: 20.0000 mg | DELAYED_RELEASE_TABLET | Freq: Every day | ORAL | 1 refills | Status: AC

## 2024-06-20 MED ORDER — TROSPIUM CHLORIDE 20 MG PO TABS: 20.0000 mg | ORAL_TABLET | Freq: Two times a day (BID) | ORAL | 1 refills | Status: AC

## 2024-06-20 MED ORDER — EPINEPHRINE 0.3 MG/0.3ML IJ SOAJ: 0.3000 mg | Freq: Once | INTRAMUSCULAR | 0 refills | Status: DC | PRN

## 2024-06-20 NOTE — Progress Notes (Signed)
 Subjective:    Patient ID: Ashley Bradford, female    DOB: 22-Mar-1971, 53 y.o.   MRN: [id number]  Chief Complaint  Patient presents with   Medical Management of Chronic Issues   PT presents to the office today for chronic follow up. She has ADHD and currently taking Vyvanse  50 mg. States this is helping her stay on task and focused.    PT is morbid obese with HTN and GERD.   States she has lower back pain and bilateral knee pain of 7 out 10 at times. She goes to the chiropractor that helps. She is going to the gym and using the pool that helps.  Hypertension This is a chronic problem. The current episode started more than 1 year ago. The problem has been resolved since onset. The problem is controlled. Associated symptoms include anxiety and malaise/fatigue. Pertinent negatives include no peripheral edema. Risk factors for coronary artery disease include dyslipidemia, obesity and sedentary lifestyle. The current treatment provides moderate improvement. There is no history of heart failure.  Asthma There is no cough, frequent throat clearing, hoarse voice or wheezing. This is a chronic problem. The current episode started more than 1 year ago. The problem occurs intermittently. Associated symptoms include heartburn and malaise/fatigue. Her past medical history is significant for asthma.  Gastroesophageal Reflux She complains of belching and heartburn. She reports no coughing, no hoarse voice or no wheezing. This is a chronic problem. The current episode started more than 1 year ago. The problem occurs occasionally. The symptoms are aggravated by certain foods. Risk factors include obesity. She has tried a PPI for the symptoms. The treatment provided moderate relief.  Insomnia Primary symptoms: difficulty falling asleep, frequent awakening, malaise/fatigue.   The current episode started more than one year. The problem occurs intermittently. Past treatments include medication. The treatment  provided mild relief.  Anxiety Presents for follow-up visit. Symptoms include excessive worry, insomnia, nervous/anxious behavior and restlessness. Symptoms occur most days. The severity of symptoms is mild.   Her past medical history is significant for asthma.  Urinary Frequency  This is a chronic problem. The current episode started more than 1 year ago. The patient is experiencing no pain. Associated symptoms include frequency and urgency. Treatments tried: detrol . The treatment provided mild relief.      Review of Systems  Constitutional:  Positive for malaise/fatigue.  HENT:  Negative for hoarse voice.   Respiratory:  Negative for cough and wheezing.   Gastrointestinal:  Positive for heartburn.  Genitourinary:  Positive for frequency and urgency.  Psychiatric/Behavioral:  The patient is nervous/anxious and has insomnia.   All other systems reviewed and are negative.  Family History  Problem Relation Age of Onset   Hypertension Mother    Hypertension Father    Social History   Socioeconomic History   Marital status: Single    Spouse name: Not on file   Number of children: Not on file   Years of education: Not on file   Highest education level: Not on file  Occupational History   Not on file  Tobacco Use   Smoking status: Never   Smokeless tobacco: Never  Vaping Use   Vaping status: Never Used  Substance and Sexual Activity   Alcohol use: Yes    Comment: occasionally   Drug use: No   Sexual activity: Not Currently    Birth control/protection: Surgical  Other Topics Concern   Not on file  Social History Narrative   Not on file  Social Drivers of Corporate investment banker Strain: Not on file  Food Insecurity: Not on file  Transportation Needs: Not on file  Physical Activity: Not on file  Stress: Not on file  Social Connections: Not on file       Objective:   Physical Exam Vitals reviewed.  Constitutional:      General: She is not in acute  distress.    Appearance: She is well-developed. She is obese.  HENT:     Head: Normocephalic and atraumatic.     Right Ear: Tympanic membrane normal.     Left Ear: Tympanic membrane normal.  Eyes:     Pupils: Pupils are equal, round, and reactive to light.  Neck:     Thyroid : No thyromegaly.  Cardiovascular:     Rate and Rhythm: Normal rate and regular rhythm.     Heart sounds: Normal heart sounds. No murmur heard. Pulmonary:     Effort: Pulmonary effort is normal. No respiratory distress.     Breath sounds: Normal breath sounds. No wheezing.  Abdominal:     General: Bowel sounds are normal. There is no distension.     Palpations: Abdomen is soft.     Tenderness: There is no abdominal tenderness.  Musculoskeletal:        General: No tenderness. Normal range of motion.     Cervical back: Normal range of motion and neck supple.  Skin:    General: Skin is warm and dry.     Findings: No rash.  Neurological:     Mental Status: She is alert and oriented to person, place, and time.     Cranial Nerves: No cranial nerve deficit.     Deep Tendon Reflexes: Reflexes are normal and symmetric.  Psychiatric:        Behavior: Behavior normal.        Thought Content: Thought content normal.        Judgment: Judgment normal.      BP 132/79   Pulse 69   Temp 97.9 F (36.6 C) (Temporal)   Ht 5' 4 (1.626 m)   Wt 252 lb (114.3 kg)   LMP 11/23/2017 (Approximate)   BMI 43.26 kg/m      Assessment & Plan:  Ashley Bradford comes in today with chief complaint of Medical Management of Chronic Issues   Diagnosis and orders addressed: 1. GAD (generalized anxiety disorder) - busPIRone  (BUSPAR ) 5 MG tablet; TAKE ONE TABLET BY MOUTH 3 TIMES DAILY  Dispense: 90 tablet; Refill: 2  2. ADHD, adult residual type - lisdexamfetamine (VYVANSE ) 50 MG capsule; Take 1 capsule (50 mg total) by mouth daily before breakfast.  Dispense: 30 capsule; Refill: 0 - lisdexamfetamine (VYVANSE ) 50 MG capsule; Take  1 capsule (50 mg total) by mouth daily before breakfast.  Dispense: 30 capsule; Refill: 0 - lisdexamfetamine (VYVANSE ) 50 MG capsule; Take 1 capsule (50 mg total) by mouth daily before breakfast.  Dispense: 30 capsule; Refill: 0  3. Controlled substance agreement signed - lisdexamfetamine (VYVANSE ) 50 MG capsule; Take 1 capsule (50 mg total) by mouth daily before breakfast.  Dispense: 30 capsule; Refill: 0 - lisdexamfetamine (VYVANSE ) 50 MG capsule; Take 1 capsule (50 mg total) by mouth daily before breakfast.  Dispense: 30 capsule; Refill: 0 - lisdexamfetamine (VYVANSE ) 50 MG capsule; Take 1 capsule (50 mg total) by mouth daily before breakfast.  Dispense: 30 capsule; Refill: 0 - zolpidem  (AMBIEN ) 10 MG tablet; Take 1 tablet (10 mg total) by mouth at bedtime  as needed for sleep.  Dispense: 90 tablet; Refill: 0  4. Gastroesophageal reflux disease without esophagitis - pantoprazole  (PROTONIX ) 20 MG tablet; Take 1 tablet (20 mg total) by mouth daily.  Dispense: 90 tablet; Refill: 1  5. Primary insomnia  - zolpidem  (AMBIEN ) 10 MG tablet; Take 1 tablet (10 mg total) by mouth at bedtime as needed for sleep.  Dispense: 90 tablet; Refill: 0  6. Primary hypertension (Primary  7. Morbid obesity (HCC)   8. Mild intermittent asthma without complication  9. Vitamin D  deficiency   Meds as prescribed Behavior modification as needed Continue Vyanase 50 mg Patient reviewed in [place] controlled database, no flags noted. Contract and drug screen are up to date.  Continue current medications  Health Maintenance reviewed Diet and exercise encouraged  Follow up plan: 3 months    Bari Learn, FNP

## 2024-06-20 NOTE — Patient Instructions (Signed)

## 2024-06-23 ENCOUNTER — Ambulatory Visit: Admitting: Family

## 2024-07-14 ENCOUNTER — Encounter: Admitting: Family

## 2024-08-25 ENCOUNTER — Ambulatory Visit: Admitting: Family

## 2024-08-25 ENCOUNTER — Telehealth: Payer: Self-pay | Admitting: Family

## 2024-08-25 ENCOUNTER — Encounter: Payer: Self-pay | Admitting: Family

## 2024-08-25 VITALS — BP 121/78 | HR 67 | Temp 98.0°F | Ht 64.0 in | Wt 240.8 lb

## 2024-08-25 DIAGNOSIS — M5442 Lumbago with sciatica, left side: Secondary | ICD-10-CM

## 2024-08-25 DIAGNOSIS — J4520 Mild intermittent asthma, uncomplicated: Secondary | ICD-10-CM

## 2024-08-25 DIAGNOSIS — F411 Generalized anxiety disorder: Secondary | ICD-10-CM

## 2024-08-25 DIAGNOSIS — Z1211 Encounter for screening for malignant neoplasm of colon: Secondary | ICD-10-CM

## 2024-08-25 DIAGNOSIS — D509 Iron deficiency anemia, unspecified: Secondary | ICD-10-CM

## 2024-08-25 DIAGNOSIS — F908 Attention-deficit hyperactivity disorder, other type: Secondary | ICD-10-CM

## 2024-08-25 DIAGNOSIS — Z79899 Other long term (current) drug therapy: Secondary | ICD-10-CM

## 2024-08-25 DIAGNOSIS — E559 Vitamin D deficiency, unspecified: Secondary | ICD-10-CM

## 2024-08-25 DIAGNOSIS — I10 Essential (primary) hypertension: Secondary | ICD-10-CM

## 2024-08-25 DIAGNOSIS — F5101 Primary insomnia: Secondary | ICD-10-CM

## 2024-08-25 DIAGNOSIS — G8929 Other chronic pain: Secondary | ICD-10-CM | POA: Insufficient documentation

## 2024-08-25 DIAGNOSIS — K219 Gastro-esophageal reflux disease without esophagitis: Secondary | ICD-10-CM

## 2024-08-25 DIAGNOSIS — Z91010 Allergy to peanuts: Secondary | ICD-10-CM

## 2024-08-25 MED ORDER — IBUPROFEN 800 MG PO TABS: 800.0000 mg | ORAL_TABLET | Freq: Two times a day (BID) | ORAL | 3 refills | Status: AC | PRN

## 2024-08-25 MED ORDER — LISDEXAMFETAMINE DIMESYLATE 50 MG PO CAPS: 50.0000 mg | ORAL_CAPSULE | Freq: Every day | ORAL | 0 refills | Status: AC

## 2024-08-25 MED ORDER — ZOLPIDEM TARTRATE ER 6.25 MG PO TBCR: 6.2500 mg | EXTENDED_RELEASE_TABLET | Freq: Every evening | ORAL | 0 refills | Status: DC | PRN

## 2024-08-25 MED ORDER — ALBUTEROL SULFATE HFA 108 (90 BASE) MCG/ACT IN AERS: 2.0000 | INHALATION_SPRAY | Freq: Four times a day (QID) | RESPIRATORY_TRACT | 1 refills | Status: AC | PRN

## 2024-08-25 MED ORDER — EPINEPHRINE 0.3 MG/0.3ML IJ SOAJ: 0.3000 mg | Freq: Once | INTRAMUSCULAR | 0 refills | Status: AC | PRN

## 2024-08-25 NOTE — Telephone Encounter (Unsigned)
 Copied from CRM #[id number]. Topic: Clinical - Prescription Issue >> Aug 25, 2024  3:57 PM Zebedee SAUNDERS wrote: Reason for CRM: [facility] - [address] [place] [address] Phone: [phone]  Fax: [phone] called stated pt just picked up zolpidem  (AMBIEN  CR) 6.25 MG CR tablet a 5 days ago for 10 mg tablets 28 day supply. Do you still want pharmacy to fill medication today? Please fax or call.

## 2024-08-25 NOTE — Progress Notes (Signed)
 Subjective:    Patient ID: Ashley Bradford, female    DOB: [birth year], 53 y.o.   MRN: [id number]  Chief Complaint  Patient presents with   Medical Management of Chronic Issues   PT presents to the office today for chronic follow up. She has ADHD and currently taking Vyvanse  50 mg. States this is helping her stay on task and focused.    PT is morbid obese with HTN and GERD.   States she has lower back pain and bilateral knee pain of 7 out 10 at times. She goes to the chiropractor that helps. She is going to the gym and using the pool that helps.  Hypertension This is a chronic problem. The current episode started more than 1 year ago. The problem has been resolved since onset. The problem is controlled. Associated symptoms include anxiety. Pertinent negatives include no malaise/fatigue, peripheral edema or shortness of breath. Risk factors for coronary artery disease include dyslipidemia, obesity and sedentary lifestyle. The current treatment provides moderate improvement. There is no history of heart failure.  Asthma There is no cough, frequent throat clearing, hoarse voice, shortness of breath or wheezing. This is a chronic problem. The current episode started more than 1 year ago. The problem occurs intermittently. Associated symptoms include heartburn. Pertinent negatives include no malaise/fatigue. Her past medical history is significant for asthma.  Gastroesophageal Reflux She complains of belching and heartburn. She reports no coughing, no hoarse voice or no wheezing. This is a chronic problem. The current episode started more than 1 year ago. The problem occurs occasionally. The symptoms are aggravated by certain foods. Risk factors include obesity. She has tried a PPI for the symptoms. The treatment provided moderate relief.  Insomnia Primary symptoms: difficulty falling asleep, frequent awakening, no malaise/fatigue.   The current episode started more than one year. The problem occurs  intermittently. Past treatments include medication. The treatment provided mild relief.  Anxiety Presents for follow-up visit. Symptoms include excessive worry, insomnia, nervous/anxious behavior and restlessness. Patient reports no shortness of breath. Symptoms occur most days. The severity of symptoms is mild.   Her past medical history is significant for asthma.  Urinary Frequency  This is a chronic problem. The current episode started more than 1 year ago. The patient is experiencing no pain. Associated symptoms include frequency and urgency. Treatments tried: sanctura . The treatment provided mild relief.      Review of Systems  Constitutional:  Negative for malaise/fatigue.  HENT:  Negative for hoarse voice.   Respiratory:  Negative for cough, shortness of breath and wheezing.   Gastrointestinal:  Positive for heartburn.  Genitourinary:  Positive for frequency and urgency.  Psychiatric/Behavioral:  The patient is nervous/anxious and has insomnia.   All other systems reviewed and are negative.  Family History  Problem Relation Age of Onset   Hypertension Mother    Hypertension Father    Social History   Socioeconomic History   Marital status: Single    Spouse name: Not on file   Number of children: Not on file   Years of education: Not on file   Highest education level: Not on file  Occupational History   Not on file  Tobacco Use   Smoking status: Never   Smokeless tobacco: Never  Vaping Use   Vaping status: Never Used  Substance and Sexual Activity   Alcohol use: Yes    Comment: occasionally   Drug use: No   Sexual activity: Not Currently    Birth control/protection:  Surgical  Other Topics Concern   Not on file  Social History Narrative   Not on file   Social Drivers of Health   Financial Resource Strain: Not on file  Food Insecurity: Not on file  Transportation Needs: Not on file  Physical Activity: Not on file  Stress: Not on file  Social Connections:  Not on file       Objective:   Physical Exam Vitals reviewed.  Constitutional:      General: She is not in acute distress.    Appearance: She is well-developed. She is obese.  HENT:     Head: Normocephalic and atraumatic.     Right Ear: Tympanic membrane normal.     Left Ear: Tympanic membrane normal.  Eyes:     Pupils: Pupils are equal, round, and reactive to light.  Neck:     Thyroid : No thyromegaly.  Cardiovascular:     Rate and Rhythm: Normal rate and regular rhythm.     Heart sounds: Normal heart sounds. No murmur heard. Pulmonary:     Effort: Pulmonary effort is normal. No respiratory distress.     Breath sounds: Normal breath sounds. No wheezing.  Abdominal:     General: Bowel sounds are normal. There is no distension.     Palpations: Abdomen is soft.     Tenderness: There is no abdominal tenderness.  Musculoskeletal:        General: No tenderness. Normal range of motion.     Cervical back: Normal range of motion and neck supple.  Skin:    General: Skin is warm and dry.     Findings: No rash.  Neurological:     Mental Status: She is alert and oriented to person, place, and time.     Cranial Nerves: No cranial nerve deficit.     Deep Tendon Reflexes: Reflexes are normal and symmetric.  Psychiatric:        Behavior: Behavior normal.        Thought Content: Thought content normal.        Judgment: Judgment normal.      BP 121/78   Pulse 67   Temp 98 F (36.7 C) (Temporal)   Ht 5' 4 (1.626 m)   Wt 240 lb 12.8 oz (109.2 kg)   LMP 11/23/2017 (Approximate)   SpO2 97%   BMI 41.33 kg/m      Assessment & Plan:  Ashley Bradford comes in today with chief complaint of Medical Management of Chronic Issues   Diagnosis and orders addressed: 1. ADHD, adult residual type (Primary) Meds as prescribed Behavior modification as needed Follow-up for recheck in 3 months - lisdexamfetamine (VYVANSE ) 50 MG capsule; Take 1 capsule (50 mg total) by mouth daily before  breakfast.  Dispense: 30 capsule; Refill: 0 - lisdexamfetamine (VYVANSE ) 50 MG capsule; Take 1 capsule (50 mg total) by mouth daily before breakfast.  Dispense: 30 capsule; Refill: 0 - lisdexamfetamine (VYVANSE ) 50 MG capsule; Take 1 capsule (50 mg total) by mouth daily before breakfast.  Dispense: 30 capsule; Refill: 0 - CMP14+EGFR  2. Mild intermittent asthma without complication - albuterol  (VENTOLIN  HFA) 108 (90 Base) MCG/ACT inhaler; Inhale 2 puffs into the lungs every 6 (six) hours as needed for wheezing or shortness of breath.  Dispense: 8.5 g; Refill: 1 - CMP14+EGFR  3. Controlled substance agreement signed - lisdexamfetamine (VYVANSE ) 50 MG capsule; Take 1 capsule (50 mg total) by mouth daily before breakfast.  Dispense: 30 capsule; Refill: 0 - lisdexamfetamine (VYVANSE )  50 MG capsule; Take 1 capsule (50 mg total) by mouth daily before breakfast.  Dispense: 30 capsule; Refill: 0 - lisdexamfetamine (VYVANSE ) 50 MG capsule; Take 1 capsule (50 mg total) by mouth daily before breakfast.  Dispense: 30 capsule; Refill: 0 - CMP14+EGFR  4. GAD (generalized anxiety disorder) - CMP14+EGFR  5. Gastroesophageal reflux disease without esophagitis - CMP14+EGFR   6. Primary insomnia Will change Ambien  10 mg to CR 6.25 mg - zolpidem  (AMBIEN  CR) 6.25 MG CR tablet; Take 1 tablet (6.25 mg total) by mouth at bedtime as needed for sleep.  Dispense: 90 tablet; Refill: 0 - CMP14+EGFR  7. Primary hypertension  - CMP14+EGFR  8. Iron deficiency anemia, unspecified iron deficiency anemia type - CMP14+EGFR  9. Morbid obesity (HCC) - CMP14+EGFR  10. Vitamin D  deficiency - CMP14+EGFR  11. Colon cancer screening - Cologuard - CMP14+EGFR  12. Peanut allergy - EPINEPHrine  0.3 mg/0.3 mL IJ SOAJ injection; Inject 0.3 mg into the muscle once as needed for up to 1 dose for anaphylaxis.  Dispense: 2 each; Refill: 0 - CMP14+EGFR  13. Chronic left-sided low back pain with left-sided sciatica -  ibuprofen  (ADVIL ) 800 MG tablet; Take 1 tablet (800 mg total) by mouth 2 (two) times daily as needed.  Dispense: 60 tablet; Refill: 3 - CMP14+EGFR   Meds as prescribed Behavior modification as needed Continue Vyanase 50 mg Patient reviewed in [place] controlled database, no flags noted. Contract and drug screen are up to date.  Continue current medications  Health Maintenance reviewed Diet and exercise encouraged  Follow up plan: 3 months    Bari Learn, FNP

## 2024-08-25 NOTE — Patient Instructions (Signed)
 Insomnia Insomnia is a sleep disorder that makes it difficult to fall asleep or stay asleep. Insomnia can cause fatigue, low energy, difficulty concentrating, mood swings, and poor performance at work or school. There are three different ways to classify insomnia: Difficulty falling asleep. Difficulty staying asleep. Waking up too early in the morning. Any type of insomnia can be long-term (chronic) or short-term (acute). Both are common. Short-term insomnia usually lasts for 3 months or less. Chronic insomnia occurs at least three times a week for longer than 3 months. What are the causes? Insomnia may be caused by another condition, situation, or substance, such as: Having certain mental health conditions, such as anxiety and depression. Using caffeine, alcohol , tobacco, or drugs. Having gastrointestinal conditions, such as gastroesophageal reflux disease (GERD). Having certain medical conditions. These include: Asthma. Alzheimer's disease. Stroke. Chronic pain. An overactive thyroid  gland (hyperthyroidism). Other sleep disorders, such as restless legs syndrome and sleep apnea. Menopause. Sometimes, the cause of insomnia may not be known. What increases the risk? Risk factors for insomnia include: Gender. Females are affected more often than males. Age. Insomnia is more common as people get older. Stress and certain medical and mental health conditions. Lack of exercise. Having an irregular work schedule. This may include working night shifts and traveling between different time zones. What are the signs or symptoms? If you have insomnia, the main symptom is having trouble falling asleep or having trouble staying asleep. This may lead to other symptoms, such as: Feeling tired or having low energy. Feeling nervous about going to sleep. Not feeling rested in the morning. Having trouble concentrating. Feeling irritable, anxious, or depressed. How is this diagnosed? This condition  may be diagnosed based on: Your symptoms and medical history. Your health care provider may ask about: Your sleep habits. Any medical conditions you have. Your mental health. A physical exam. How is this treated? Treatment for insomnia depends on the cause. Treatment may focus on treating an underlying condition that is causing the insomnia. Treatment may also include: Medicines to help you sleep. Counseling or therapy. Lifestyle adjustments to help you sleep better. Follow these instructions at home: Eating and drinking  Limit or avoid alcohol , caffeinated beverages, and products that contain nicotine and tobacco, especially close to bedtime. These can disrupt your sleep. Do not eat a large meal or eat spicy foods right before bedtime. This can lead to digestive discomfort that can make it hard for you to sleep. Sleep habits  Keep a sleep diary to help you and your health care provider figure out what could be causing your insomnia. Write down: When you sleep. When you wake up during the night. How well you sleep and how rested you feel the next day. Any side effects of medicines you are taking. What you eat and drink. Make your bedroom a dark, comfortable place where it is easy to fall asleep. Put up shades or blackout curtains to block light from outside. Use a white noise machine to block noise. Keep the temperature cool. Limit screen use before bedtime. This includes: Not watching TV. Not using your smartphone, tablet, or computer. Stick to a routine that includes going to bed and waking up at the same times every day and night. This can help you fall asleep faster. Consider making a quiet activity, such as reading, part of your nighttime routine. Try to avoid taking naps during the day so that you sleep better at night. Get out of bed if you are still awake after  15 minutes of trying to sleep. Keep the lights down, but try reading or doing a quiet activity. When you feel  sleepy, go back to bed. General instructions Take over-the-counter and prescription medicines only as told by your health care provider. Exercise regularly as told by your health care provider. However, avoid exercising in the hours right before bedtime. Use relaxation techniques to manage stress. Ask your health care provider to suggest some techniques that may work well for you. These may include: Breathing exercises. Routines to release muscle tension. Visualizing peaceful scenes. Make sure that you drive carefully. Do not drive if you feel very sleepy. Keep all follow-up visits. This is important. Contact a health care provider if: You are tired throughout the day. You have trouble in your daily routine due to sleepiness. You continue to have sleep problems, or your sleep problems get worse. Get help right away if: You have thoughts about hurting yourself or someone else. Get help right away if you feel like you may hurt yourself or others, or have thoughts about taking your own life. Go to your nearest emergency room or: Call 911. Call the National Suicide Prevention Lifeline at 2232757840 or 988. This is open 24 hours a day. Text the Crisis Text Line at 657-529-4371. Summary Insomnia is a sleep disorder that makes it difficult to fall asleep or stay asleep. Insomnia can be long-term (chronic) or short-term (acute). Treatment for insomnia depends on the cause. Treatment may focus on treating an underlying condition that is causing the insomnia. Keep a sleep diary to help you and your health care provider figure out what could be causing your insomnia. This information is not intended to replace advice given to you by your health care provider. Make sure you discuss any questions you have with your health care provider. Document Revised: 08/22/2021 Document Reviewed: 08/22/2021 Elsevier Patient Education  2024 [employer].

## 2024-08-26 ENCOUNTER — Ambulatory Visit: Payer: Self-pay | Admitting: Family

## 2024-08-26 LAB — CMP14+EGFR
ALT: 17 IU/L (ref 0–32)
AST: 15 IU/L (ref 0–40)
Albumin: 4.3 g/dL (ref 3.8–4.9)
Alkaline Phosphatase: 95 IU/L (ref 49–135)
BUN/Creatinine Ratio: 18 (ref 9–23)
BUN: 16 mg/dL (ref 6–24)
Bilirubin Total: 0.4 mg/dL (ref 0.0–1.2)
CO2: 26 mmol/L (ref 20–29)
Calcium: 9.7 mg/dL (ref 8.7–10.2)
Chloride: 102 mmol/L (ref 96–106)
Creatinine, Ser: 0.91 mg/dL (ref 0.57–1.00)
Globulin, Total: 2.3 g/dL (ref 1.5–4.5)
Glucose: 113 mg/dL — ABNORMAL HIGH (ref 70–99)
Potassium: 4.2 mmol/L (ref 3.5–5.2)
Sodium: 141 mmol/L (ref 134–144)
Total Protein: 6.6 g/dL (ref 6.0–8.5)
eGFR: 75 mL/min/1.73 (ref >59)

## 2024-08-26 NOTE — Telephone Encounter (Signed)
Yes, ok to fill today.

## 2024-08-26 NOTE — Telephone Encounter (Signed)
 Called patient and left detailed message.

## 2024-09-09 ENCOUNTER — Encounter: Admitting: Family

## 2024-09-09 ENCOUNTER — Other Ambulatory Visit: Payer: Self-pay | Admitting: Family

## 2024-09-09 DIAGNOSIS — F411 Generalized anxiety disorder: Secondary | ICD-10-CM

## 2024-09-09 DIAGNOSIS — I10 Essential (primary) hypertension: Secondary | ICD-10-CM

## 2024-09-11 LAB — COLOGUARD

## 2024-09-15 ENCOUNTER — Encounter: Payer: Self-pay | Admitting: Family

## 2024-09-19 ENCOUNTER — Other Ambulatory Visit: Payer: Self-pay | Admitting: *Deleted

## 2024-09-19 MED ORDER — ZOLPIDEM TARTRATE 10 MG PO TABS: 10.0000 mg | ORAL_TABLET | Freq: Every evening | ORAL | 0 refills | Status: AC | PRN

## 2024-09-19 NOTE — Telephone Encounter (Signed)
 Copied from CRM #[id number]. Topic: Clinical - Medication Question >> Sep 19, 2024  3:49 PM Harlene ORN wrote: Reason for CRM: Patient needs to have her ambien  10 mg regular release. The 6.25 extended release is not working.

## 2024-09-23 LAB — COLOGUARD: COLOGUARD: NEGATIVE

## 2024-10-15 ENCOUNTER — Encounter: Payer: Self-pay | Admitting: Family Medicine

## 2024-10-15 ENCOUNTER — Telehealth: Admitting: Family Medicine

## 2024-10-15 DIAGNOSIS — B9689 Other specified bacterial agents as the cause of diseases classified elsewhere: Secondary | ICD-10-CM | POA: Diagnosis not present

## 2024-10-15 DIAGNOSIS — H109 Unspecified conjunctivitis: Secondary | ICD-10-CM

## 2024-10-15 MED ORDER — TOBRAMYCIN-DEXAMETHASONE 0.3-0.1 % OP SUSP: OPHTHALMIC | 0 refills | Status: AC

## 2024-10-15 NOTE — Progress Notes (Signed)
 "  Subjective:  Patient ID: Rexene LELON Dollar, female    DOB: 1971-09-01  Age: 54 y.o. MRN: [id number]  CC: No chief complaint on file.   HPI  Discussed the use of AI scribe software for clinical note transcription with the patient, who gave verbal consent to proceed.  History of Present Illness AAMARI WEST is a 54 year old female who presents with left eye burning, itching, and crusty drainage.  Symptoms began yesterday afternoon, with the left eye being the most affected. She describes the symptoms as burning, hurting, and itching, primarily in the left eye.  This morning, she noticed puffiness and crusty drainage from the eyes. She has been using warm compresses to manage the symptoms, which helped to open them up.  No issues with vision, stating she can see clearly during the video visit.          08/25/2024    3:39 PM 06/20/2024   12:21 PM 03/31/2024   11:06 AM  Depression screen PHQ 2/9  Decreased Interest 0 0 0  Down, Depressed, Hopeless 0 0 0  PHQ - 2 Score 0 0 0  Altered sleeping 2 1 1   Tired, decreased energy 0 0 0  Change in appetite 0 0 0  Feeling bad or failure about yourself  0 0 0  Trouble concentrating 2 1 2   Moving slowly or fidgety/restless 0 0 0  Suicidal thoughts 0 0 0  PHQ-9 Score 4 2  3    Difficult doing work/chores Somewhat difficult Not difficult at all Somewhat difficult     Data saved with a previous flowsheet row definition    History Derica has a past medical history of Anemia, Asthma, Family history of adverse reaction to anesthesia, GERD (gastroesophageal reflux disease), Headache, History of kidney stones, Hypertension, Left ureteral calculus, and Urgency of urination.   She has a past surgical history that includes Nephrolithotomy (Right, 1996); Cesarean section (07-02-2001 &   1997); Breast reduction surgery (2005); Abdominoplasty/panniculectomy (2004); and Total laparoscopic hysterectomy with salpingectomy (Bilateral, 12/06/2017).   Her family  history includes Hypertension in her father and mother.She reports that she has never smoked. She has never used smokeless tobacco. She reports current alcohol use. She reports that she does not use drugs.  Objective:  LMP 11/23/2017   BP Readings from Last 3 Encounters:  08/25/24 121/78  06/20/24 132/79  03/31/24 129/82    Wt Readings from Last 3 Encounters:  08/25/24 240 lb 12.8 oz (109.2 kg)  06/20/24 252 lb (114.3 kg)  03/31/24 253 lb (114.8 kg)     Physical Exam Physical Exam GENERAL: Alert, cooperative, well developed, no acute distress. HEENT: Normocephalic,  NEUROLOGICAL: Cranial nerves grossly intact, moves all extremities without gross motor or sensory deficit.   Assessment & Plan:  Bacterial conjunctivitis of both eyes  Other orders -     Tobramycin -dexAMETHasone ; Apply 1 drop in affected eye(s) every 2 hours for two days. Then every 4 hours for 5 days.  Dispense: 5 mL; Refill: 0    Assessment and Plan Assessment & Plan Acute bilateral conjunctivitis   She experiences burning, itching, and crusting, more severe in the left eye, with symptoms starting yesterday afternoon. Examination shows a bloodshot appearance in the left eye with puffy and crusted drainage. Warm compresses have been used for relief. Prescribe Tobradex  eye drops: one drop in each eye every two hours for the first two days, then one drop four times a day for five more days, totaling seven days.  Advise setting an alarm for nighttime dosing, approximately four hours into sleep. Prescription sent to [facility] in [place], [place] . Use drops diligently to prevent symptom rebound.       Follow-up: Return if symptoms worsen or fail to improve.  Butler Der, M.D. "

## 2024-11-24 ENCOUNTER — Ambulatory Visit: Admitting: Family
